# Patient Record
Sex: Female | Born: 1950 | Race: White | Hispanic: No | Smoking: Never smoker
Health system: Southern US, Community
[De-identification: ages and names within clinical notes are randomized; demographics above are authoritative.]

## PROBLEM LIST (undated history)

## (undated) DIAGNOSIS — G4730 Sleep apnea, unspecified: Secondary | ICD-10-CM

## (undated) DIAGNOSIS — K219 Gastro-esophageal reflux disease without esophagitis: Secondary | ICD-10-CM

## (undated) DIAGNOSIS — I739 Peripheral vascular disease, unspecified: Secondary | ICD-10-CM

## (undated) DIAGNOSIS — I10 Essential (primary) hypertension: Secondary | ICD-10-CM

## (undated) DIAGNOSIS — E7800 Pure hypercholesterolemia, unspecified: Secondary | ICD-10-CM

## (undated) DIAGNOSIS — I2510 Atherosclerotic heart disease of native coronary artery without angina pectoris: Secondary | ICD-10-CM

## (undated) DIAGNOSIS — F32A Depression, unspecified: Secondary | ICD-10-CM

## (undated) DIAGNOSIS — E039 Hypothyroidism, unspecified: Secondary | ICD-10-CM

## (undated) DIAGNOSIS — C229 Malignant neoplasm of liver, not specified as primary or secondary: Secondary | ICD-10-CM

## (undated) DIAGNOSIS — F329 Major depressive disorder, single episode, unspecified: Secondary | ICD-10-CM

## (undated) DIAGNOSIS — E119 Type 2 diabetes mellitus without complications: Secondary | ICD-10-CM

## (undated) DIAGNOSIS — M1990 Unspecified osteoarthritis, unspecified site: Secondary | ICD-10-CM

## (undated) HISTORY — PX: COLONOSCOPY: SHX174

## (undated) HISTORY — DX: Type 2 diabetes mellitus without complications: E11.9

## (undated) HISTORY — PX: DILATION AND CURETTAGE OF UTERUS: SHX78

## (undated) HISTORY — PX: CORONARY ANGIOPLASTY WITH STENT PLACEMENT: SHX49

## (undated) HISTORY — PX: CARPAL TUNNEL RELEASE: SHX101

## (undated) HISTORY — DX: Malignant neoplasm of liver, not specified as primary or secondary: C22.9

## (undated) HISTORY — PX: TONSILLECTOMY: SUR1361

## (undated) HISTORY — DX: Atherosclerotic heart disease of native coronary artery without angina pectoris: I25.10

---

## 1999-12-23 ENCOUNTER — Other Ambulatory Visit: Admission: RE | Admit: 1999-12-23 | Discharge: 1999-12-23 | Payer: Self-pay | Admitting: Obstetrics and Gynecology

## 2000-03-07 ENCOUNTER — Emergency Department (HOSPITAL_COMMUNITY): Admission: EM | Admit: 2000-03-07 | Discharge: 2000-03-07 | Payer: Self-pay | Admitting: *Deleted

## 2000-12-01 ENCOUNTER — Encounter: Payer: Self-pay | Admitting: Emergency Medicine

## 2000-12-01 ENCOUNTER — Emergency Department (HOSPITAL_COMMUNITY): Admission: EM | Admit: 2000-12-01 | Discharge: 2000-12-01 | Payer: Self-pay | Admitting: Emergency Medicine

## 2001-02-27 ENCOUNTER — Other Ambulatory Visit: Admission: RE | Admit: 2001-02-27 | Discharge: 2001-02-27 | Payer: Self-pay | Admitting: Obstetrics and Gynecology

## 2002-03-01 ENCOUNTER — Other Ambulatory Visit: Admission: RE | Admit: 2002-03-01 | Discharge: 2002-03-01 | Payer: Self-pay | Admitting: Obstetrics and Gynecology

## 2002-12-25 ENCOUNTER — Encounter: Payer: Self-pay | Admitting: Gastroenterology

## 2002-12-28 ENCOUNTER — Encounter: Payer: Self-pay | Admitting: Gastroenterology

## 2003-08-09 ENCOUNTER — Encounter: Payer: Self-pay | Admitting: Emergency Medicine

## 2003-08-09 ENCOUNTER — Inpatient Hospital Stay (HOSPITAL_COMMUNITY): Admission: EM | Admit: 2003-08-09 | Discharge: 2003-08-11 | Payer: Self-pay | Admitting: Emergency Medicine

## 2003-08-11 ENCOUNTER — Encounter: Payer: Self-pay | Admitting: Cardiology

## 2003-08-16 ENCOUNTER — Ambulatory Visit (HOSPITAL_COMMUNITY): Admission: RE | Admit: 2003-08-16 | Discharge: 2003-08-16 | Payer: Self-pay | Admitting: Cardiology

## 2004-12-15 ENCOUNTER — Emergency Department (HOSPITAL_COMMUNITY): Admission: EM | Admit: 2004-12-15 | Discharge: 2004-12-16 | Payer: Self-pay | Admitting: *Deleted

## 2005-12-27 ENCOUNTER — Observation Stay (HOSPITAL_COMMUNITY): Admission: EM | Admit: 2005-12-27 | Discharge: 2005-12-28 | Payer: Self-pay | Admitting: Emergency Medicine

## 2005-12-27 ENCOUNTER — Ambulatory Visit: Payer: Self-pay | Admitting: Cardiovascular Disease

## 2005-12-28 ENCOUNTER — Ambulatory Visit: Payer: Self-pay

## 2005-12-29 ENCOUNTER — Ambulatory Visit: Payer: Self-pay | Admitting: Cardiology

## 2006-01-10 ENCOUNTER — Encounter: Payer: Self-pay | Admitting: Cardiology

## 2006-01-10 ENCOUNTER — Ambulatory Visit: Payer: Self-pay

## 2006-08-31 ENCOUNTER — Encounter (INDEPENDENT_AMBULATORY_CARE_PROVIDER_SITE_OTHER): Payer: Self-pay | Admitting: *Deleted

## 2006-08-31 ENCOUNTER — Ambulatory Visit (HOSPITAL_COMMUNITY): Admission: RE | Admit: 2006-08-31 | Discharge: 2006-08-31 | Payer: Self-pay | Admitting: Obstetrics and Gynecology

## 2008-11-12 ENCOUNTER — Ambulatory Visit: Payer: Self-pay | Admitting: Gastroenterology

## 2008-11-12 DIAGNOSIS — R1013 Epigastric pain: Secondary | ICD-10-CM | POA: Insufficient documentation

## 2008-11-12 DIAGNOSIS — K219 Gastro-esophageal reflux disease without esophagitis: Secondary | ICD-10-CM | POA: Insufficient documentation

## 2008-11-12 LAB — CONVERTED CEMR LAB
ALT: 26 units/L (ref 0–35)
AST: 22 units/L (ref 0–37)
Albumin: 3.9 g/dL (ref 3.5–5.2)
Alkaline Phosphatase: 55 units/L (ref 39–117)
BUN: 11 mg/dL (ref 6–23)
Basophils Absolute: 0 10*3/uL (ref 0.0–0.1)
Basophils Relative: 0.3 % (ref 0.0–3.0)
CO2: 29 meq/L (ref 19–32)
Calcium: 9.4 mg/dL (ref 8.4–10.5)
Chloride: 102 meq/L (ref 96–112)
Creatinine, Ser: 0.7 mg/dL (ref 0.4–1.2)
Eosinophils Absolute: 0.1 10*3/uL (ref 0.0–0.7)
Eosinophils Relative: 0.9 % (ref 0.0–5.0)
GFR calc Af Amer: 112 mL/min
GFR calc non Af Amer: 92 mL/min
Glucose, Bld: 145 mg/dL — ABNORMAL HIGH (ref 70–99)
HCT: 39.4 % (ref 36.0–46.0)
Hemoglobin: 13.4 g/dL (ref 12.0–15.0)
Lipase: 26 units/L (ref 11.0–59.0)
Lymphocytes Relative: 32.3 % (ref 12.0–46.0)
MCHC: 34.1 g/dL (ref 30.0–36.0)
MCV: 94.7 fL (ref 78.0–100.0)
Monocytes Absolute: 0.4 10*3/uL (ref 0.1–1.0)
Monocytes Relative: 3.6 % (ref 3.0–12.0)
Neutro Abs: 6.1 10*3/uL (ref 1.4–7.7)
Neutrophils Relative %: 62.9 % (ref 43.0–77.0)
Platelets: 261 10*3/uL (ref 150–400)
Potassium: 3.5 meq/L (ref 3.5–5.1)
RBC: 4.16 M/uL (ref 3.87–5.11)
RDW: 12.7 % (ref 11.5–14.6)
Sodium: 141 meq/L (ref 135–145)
Total Bilirubin: 0.5 mg/dL (ref 0.3–1.2)
Total Protein: 7.1 g/dL (ref 6.0–8.3)
WBC: 9.8 10*3/uL (ref 4.5–10.5)

## 2008-11-15 ENCOUNTER — Ambulatory Visit (HOSPITAL_COMMUNITY): Admission: RE | Admit: 2008-11-15 | Discharge: 2008-11-15 | Payer: Self-pay | Admitting: Gastroenterology

## 2008-11-21 ENCOUNTER — Ambulatory Visit: Payer: Self-pay | Admitting: Gastroenterology

## 2008-11-21 DIAGNOSIS — K922 Gastrointestinal hemorrhage, unspecified: Secondary | ICD-10-CM | POA: Insufficient documentation

## 2008-11-21 DIAGNOSIS — R1011 Right upper quadrant pain: Secondary | ICD-10-CM | POA: Insufficient documentation

## 2008-11-21 DIAGNOSIS — R143 Flatulence: Secondary | ICD-10-CM

## 2008-11-21 DIAGNOSIS — K921 Melena: Secondary | ICD-10-CM | POA: Insufficient documentation

## 2008-11-21 DIAGNOSIS — K7689 Other specified diseases of liver: Secondary | ICD-10-CM | POA: Insufficient documentation

## 2008-11-21 DIAGNOSIS — R141 Gas pain: Secondary | ICD-10-CM | POA: Insufficient documentation

## 2008-11-21 DIAGNOSIS — R142 Eructation: Secondary | ICD-10-CM

## 2008-12-19 ENCOUNTER — Ambulatory Visit: Payer: Self-pay | Admitting: Gastroenterology

## 2011-03-05 NOTE — Discharge Summary (Signed)
[name], [name]                           ACCOUNT NO.:  [id number]   MEDICAL RECORD NO.:  [id number]                   PATIENT TYPE:  INP   LOCATION:  3711                                 FACILITY:  [facility]   PHYSICIAN:  Olga Millers, M.D.                DATE OF BIRTH:  October 12, 1951   DATE OF ADMISSION:  08/09/2003  DATE OF DISCHARGE:  08/11/2003                           DISCHARGE SUMMARY - REFERRING   BRIEF HISTORY:  This is a 60 year old female who was admitted to [facility] on August 09, 2003 through the emergency room for evaluation of  chest pain.  The patient had a history of hypertension, hyperlipidemia,  hypothyroidism as well as some previous surgeries.  The patient reports  dizziness and headache for approximately 1 week. She saw her primary care  M.D., Dr. Everlean Patterson 3 days prior to admission and was placed on Toprol  XL 25 mg daily secondary to hypertension.   On the morning of admission, the patient was at work when she developed  increased headache and dizziness, and a sensation of heaviness in her chest,  accompanied by nausea and dyspnea.  The patient presented to [facility]  Emergency Room where she was noted to have an abnormal EKG consisting of  T  wave inversions in 1, 2, 3 and V3-V6.  The patient also had a fluttering  sensation in her chest. She was seen by Dr. Antoine Poche and admitted for  further evaluation.   PAST MEDICAL HISTORY:  Hypertension, hyperlipidemia, hypothyroidism. She has  been under allot of increased stress recently.  History of previous  surgeries.   ALLERGIES:  NO KNOWN DRUG ALLERGIES.   SOCIAL HISTORY:  The patient lives in [place] with her boyfriend. She has  a son. She does not use alcohol or tobacco.   FAMILY HISTORY:  Patient's mother is living, she has congestive heart  failure, Alzheimer's dementia and diabetes mellitus.  Father died at age 21.  He had undergone coronary artery bypass graft surgery.   MEDICATIONS PRIOR TO ADMISSION:  1. Toprol  XL 25 mg daily.  2. Synthroid 0.1 mg daily.  3. Altace 10 mg daily.  4. Lipitor 10 mg at bedtime.  5. Estrostep 1 daily.   HOSPITAL COURSE:  As noted, the patient was admitted to [facility]  for further evaluation of chest pain as well as recent dizziness and  dyspnea.  She was scheduled for an adenosine Cardiolite. This was performed  on August 11, 2003.  The patient did have some EKG changes during this  test. As noted above, her baseline EKG was abnormal at time of admission.  She had further ST changes during the adenosine Cardiolite, and she also  developed sinus tachycardia with rates up to 120 during this study.  She had  chest pain and flushing, which resolved in the recovery period, as did the  EKG changes.  The images were reviewed by the radiologist. The study showed no ischemia,  ejection fraction 72%.  These findings were discussed with Dr. Jens Som and  decision was made to proceed with discharge, however, it was recommended  that she follow up with Dr. Antoine Poche in approximately 2 weeks to determine  if she was still having symptoms, in which case we would have a low  threshold to proceeding with cardiac catheterization.  The patient was  discharged in improved and stable condition on August 11, 2003.   LABORATORY DATA:  The patient's cardiac enzymes were negative x3.  A BUN on  day of discharge was 13, creatinine 0.8, potassium 4.0, glucose 101,  triglycerides elevated at 181.  Chest x-ray showed bilateral linear  atelectasis or scar formation, but was otherwise unremarkable.  CBC on  admission revealed hemoglobin 13, hematocrit 38.3, WBC 10.2 thousand,  platelets 286,000.   DISCHARGE MEDICATIONS:  1. Toprol  XL 25 mg daily.  2. Synthroid 0.1 mg daily.  3. Altace 10 mg daily.  4. Lipitor 10 mg at bedtime.  5. Estrostep 1 daily.  6. Enteric-coated aspirin 81 mg daily.  7. Tylenol as needed for pain.    ACTIVITY:  As tolerated.   DIET:  She was to stay on a low salt, low fat diet, to avoid sweets.   FOLLOW UP:  With Dr. Antoine Poche in approximately 2 weeks, Dr. Margrett Rud  in the next several weeks for further evaluation of hypertension and her  elevated glucose levels during this admission.   PROBLEM LIST AT TIME OF DISCHARGE:  1. Chest pain, MI ruled out.  2. Adenosine Cardiolite performed August 11, 2003. Negative for ischemia,     ejection fraction greater than 70%.  3. History of dyslipidemia.  4. History of hypothyroidism.  5. History of hypertension.      Delton See, P.A. [facility]                  Olga Millers, M.D.    DR/MEDQ  D:  08/11/2003  T:  08/11/2003  Job:  [id number]   cc:   Vale Haven. Andrey Campanile, M.D.  [address]  [place]  [place] [address]  Fax: [phone]

## 2011-03-05 NOTE — Discharge Summary (Signed)
[name], [name]                 ACCOUNT NO.:  [id number]   MEDICAL RECORD NO.:  [id number]          PATIENT TYPE:  INP   LOCATION:  2010                         FACILITY:  [facility]   PHYSICIAN:  Rollene Rotunda, M.D.   DATE OF BIRTH:  07-03-1951   DATE OF ADMISSION:  12/27/2005  DATE OF DISCHARGE:  12/28/2005                                 DISCHARGE SUMMARY   PRIMARY CARDIOLOGIST:  Rollene Rotunda, M.D.   DISCHARGING DIAGNOSIS:  Atypical chest pain, with abnormal  electrocardiogram, with negative cardiac markers for myocardial infarction.   PAST MEDICAL HISTORY:  1.  Noncardiac chest pain, with abnormal resting electrocardiogram.      1.  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      2.  Negative stress perfusion study prior to cardiac catheterization.  2.  Hypertension.  3.  Hyperlipidemia.  4.  History of palpitations, with a negative Holter monitor.  5.  Postmenopausal.  6.  Hypothyroidism.   HOSPITAL COURSE:  [name] is a 60 year old female with history as stated  above, who initially was seen in 2004 secondary to complaints of persistent  chest pain, with an abnormal EKG showing T wave inversions.  Cardiac  catheterization showed nonobstructive disease.  The patient states she has  done fine since that time until the day of admission when the patient began  having some chest discomfort and presented to her primary care physician  Dr. Daleen Snook office for further evaluation.  The patient was  transferred to [facility] Emergency Room via EMS at that time.  On arrival,  EKG was interpreted as an acute MI with ST elevation.  However, upon further  study the ST abnormalities consisted of ST depression in the inferolateral  leads.  Review of her EKG from 2004 shows a symmetric T wave inversion in  the inferolateral leads.  The patient was admitted for observation.  Cardiac  markers were cycled.  The patient ruled out for  MI.  Continued the patient's  medications from home.  Dr. Antoine Poche in to see patient on the following  morning.  Patient without any further episodes of chest discomfort.  It was  arranged for the patient to be discharged from the hospital setting to our  office for the adenosine stress Myoview.  At time of discharge, the patient  was instructed to continue her Synthroid 0.1 mg daily, metoprolol 25 mg  daily, Norvasc 10 mg daily, aspirin 81 mg daily, Prometrium 100 mg daily,  Zetia 10 mg daily, and Gynodiol 2 tablets p.o. daily.  The patient had a  follow-up appointment with Dr. Jenene Slicker assistant on December 29, 2005 at  3:00 p.m. to discuss the results of her stress test.   BLOOD WORK THIS ADMISSION:  CBC within normal limits, with a hematocrit of  44.  Kidney function:  BUN 8, with creatinine 0.7.  Liver panel all within  normal limits.  Troponin of 0.02.   Patient discharged to our office for adenosine stress test, and to follow up  as stated above.   DURATION OF DISCHARGE ENCOUNTER:  30 minutes.      Dorian Pod, NP    ______________________________  Rollene Rotunda, M.D.    MB/MEDQ  D:  02/10/2006  T:  02/11/2006  Job:  [id number]   cc:   Vale Haven. Andrey Campanile, M.D.  Fax: [phone]

## 2011-03-05 NOTE — H&P (Signed)
[name], [name]                 ACCOUNT NO.:  [id number]   MEDICAL RECORD NO.:  [id number]          PATIENT TYPE:  INP   LOCATION:  2010                         FACILITY:  [facility]   PHYSICIAN:  Charlton Haws, M.D.     DATE OF BIRTH:  1951/09/30   DATE OF ADMISSION:  12/27/2005  DATE OF DISCHARGE:                                HISTORY & PHYSICAL   PRIMARY CARDIOLOGIST:  Dr. Rollene Rotunda.   REASON FOR ADMISSION:  [name] is a 60 year old female, with history of  nonobstructive coronary artery disease by cardiac catheterization in October  2004, following complaint of persistent chest pain in the setting of a  negative perfusion study and abnormal electrocardiogram with T-wave  inversions.   Cardiac catheterization, however, notable for a long, 30% mid-LAD and a 25%  ostial lesion with otherwise normal residual arteries and normal left  ventricular function.   The patient was seen in follow-up by Dr. Antoine Poche following the  catheterization. He felt that the chest pain was noncardiac in etiology and  cleared and released the patient to return to Dr. Andrey Campanile for further  evaluation.   The patient now presents to the emergency room via EMS from Dr. Tawana Scale  office, for evaluation of chest symptoms. Per history, she states that she  has not had any chest pain since seen by [place] in the office in 2004. Moreover,  she reports no recent development of any exertional chest discomfort or  dyspnea.   Earlier this morning, while getting ready for work, she developed a vague  anterior chest sensation originating from the mid-epigastrium. At no time,  however, did she actually feel any frank chest pain. She felt a little  lightheaded but had no syncope. There was some mild nausea but no vomiting.  There was no acute dyspnea or diaphoresis. Nevertheless, she became  concerned regarding these symptoms and presented to her primary care  physician's office, and then promptly transported via EMS  to the emergency  room.   On arrival, electrocardiogram was interpreted as an acute MI with ST  elevation. However, upon further study, the ST abnormalities consist of ST  depression in the inferolateral leads.   Review of an EKG from 2004 shows a symmetric T-wave inversion in the  inferolateral leads.   On presentation, the patient is currently without any chest discomfort. She  is on IV nitroglycerin and heparin and has received one dose of 5 milligrams  IV Lopressor.   ALLERGIES:  CIPROFLOXACIN, ALEVE.   HOME MEDICATIONS:  1.  Synthroid 0.1 milligrams daily.  2.  Toprol XL 25 milligrams daily.  3.  Norvasc 10 milligrams daily.  4.  Prometrium 100 milligrams daily.  5.  The patient is on two hormone pills, one of which is Prometrium 100      milligrams daily.  6.  Aspirin 81 milligrams daily.  7.  Zetia 10 milligrams daily.   PAST HISTORY:  1.  History of noncardiac chest pain/abnormal resting electrocardiogram.      1.  Nonobstructive coronary artery disease by cardiac catheterization  October 2004.      2.  Negative stress perfusion study (pre cath).  2.  Hypertension.  3.  Hyperlipidemia.  4.  History of palpitations.      1.  Negative Holter monitor.  5.  Postmenopausal.  6.  Hypothyroidism.   SOCIAL HISTORY:  The patient lives here in [place] with a boyfriend. She  works at a [employer].  She has never smoked tobacco on a regular basis and drinks alcohol only on  occasion.   FAMILY HISTORY:  Notable for father deceased at age 28, complications from a  myocardial infarction and pneumonia while hospitalized; prior history of  multiple MIs/CABG. The patient has four sisters, none of whom have heart  disease.   REVIEW OF SYSTEMS:  Denies orthopnea. Denies paroxysmal nocturnal dyspnea,  lower extremity edema, but chronically sleep on two pillows. No recent  evidence of upper or lower GI bleeding. No recent fever, chills or  productive cough. Has noted  a tummy ache sensation two days ago but denies  any nausea until early this morning. No recent vomiting or diarrhea.  Otherwise, as noted per HPI. Remaining systems negative.   PHYSICAL EXAMINATION:  VITAL SIGNS: Blood pressure 178/87, pulse 98.7, pulse  112 and regular, respirations 20, sats 98% on room air.  GENERAL: A 60 year old female in no apparent distress.  HEENT: Normocephalic, atraumatic.  NECK: Preserved bilateral carotid pulse without bruits; no JVD.  LUNGS: Clear to auscultation in all fields.  HEART: Regular rate and rhythm (S1 and S2). No murmurs, rubs or gallops.  ABDOMEN: Protuberant, nontender with no organomegaly. Intact bowel sounds  without bruits.  EXTREMITIES: Palpable distal pulses without significant edema.  NEUROLOGICAL: No focal deficit.   Admission chest x-ray: Pending. Laboratory data: Pending.   IMPRESSION:  [name] is a 60 year old female, with history of  nonobstructive coronary artery disease by cardiac catheterization in October  2004 following a negative pharmacologic perfusion study, and with abnormal  electrocardiogram, who now presents to the emergency room with vague  symptomatology with respect to the upper epigastrium and chest region, but  with no frank chest pain.   Admission electrocardiogram is abnormal revealing ST-segment depression of  approximately 1 mm in the inferolateral leads. However, the patient does  have history of documented abnormal EKG with inferolateral T-wave inversion  by previous EKGs, prior to her catheterization.   Of note, the patient also presents with no recent development of any  exertional chest discomfort and is currently asymptomatic.   PLAN:  The patient's presenting symptoms are atypical for ischemia. However,  given her abnormal electrocardiogram, she will be admitted to the hospital for close monitoring and rule out of myocardial infarction. If serial  cardiac markers are all within normal limits, then  recommendation is to  proceed with a follow-up exercise stress Myoview in our office. Arrangements  have been made for this to be done tomorrow, December 28, 2005 at 12 noon. We  will have the patient hold Toprol on the morning of the procedure.   If, however, serial cardiac markers are abnormal, then the plan will be to  proceed with diagnostic coronary angiography.   The patient will be continued on her home medication regimen with the  addition of IV nitroglycerin and heparin which were initiated here in the  emergency room. In addition cycling cardiac markers, we will also check a  fasting lipid profile in the morning as well as a TSH level.      Gene Serpe,  P.A. [facility]    ______________________________  Charlton Haws, M.D.    GS/MEDQ  D:  12/27/2005  T:  12/28/2005  Job:  [id number]   cc:   Duffy Rhody C. Andrey Campanile, M.D.  Fax: [phone]

## 2011-03-05 NOTE — Op Note (Signed)
NAMECHELAN, HERINGER                 ACCOUNT NO.:  [id number]   MEDICAL RECORD NO.:  [id number]          PATIENT TYPE:  AMB   LOCATION:  SDC                           FACILITY:  [facility]   PHYSICIAN:  Carrington Clamp, M.D. DATE OF BIRTH:  1951-04-25   DATE OF PROCEDURE:  08/31/2006  DATE OF DISCHARGE:                               OPERATIVE REPORT   PREOPERATIVE DIAGNOSIS:  Postmenopausal bleeding.   POSTOPERATIVE DIAGNOSIS:  Postmenopausal bleeding.   PROCEDURES:  Dilation and curettage with hysteroscopy.   SURGEON:  Carrington Clamp, M.D.   ASSISTANT:  None.   ANESTHESIA:  LMA.   SPECIMENS:  Uterine curettings.   ESTIMATED BLOOD LOSS:  Minimal.   IV FLUIDS:  700 mL.   URINE OUTPUT:  Not measured.   COMPLICATIONS:  None.   FINDINGS:  Scant endometrium with possible two small submucosal fibroids  that were left intact.  No other abnormalities seen at except for  possible small endometrial polyps.   COUNTS:  Correct x3.   MEDICATIONS:  None.   TECHNIQUE:  After adequate general anesthesia was achieved, the patient  was prepped, draped usual sterile fashion in dorsal lithotomy position.  The bladder was emptied with a red rubber catheter and the cervix  dilated up with Shawnie Pons dilators after a speculum had been placed in the  vagina.  The hysteroscope was passed into the uterine cavity and the  above findings noted.  A sharp curettage was performed followed by a  look with the hysteroscope after scant tissue was removed.  There no  abnormalities seen.  After one more curettage all instruments were then  withdrawn from the vagina and the patient tolerated the procedure well,  was returned to recovery room in stable condition.   HYSTEROSCOPIC DEFICIT:  65 mL.      Carrington Clamp, M.D.  Electronically Signed     MH/MEDQ  D:  08/31/2006  T:  08/31/2006  Job:  [id number]

## 2011-03-05 NOTE — Cardiovascular Report (Signed)
   NAMEKELLER, MIKELS                           ACCOUNT NO.:  [id number]   MEDICAL RECORD NO.:  [id number]                   PATIENT TYPE:  OIB   LOCATION:  2874                                 FACILITY:  [facility]   PHYSICIAN:  Rollene Rotunda, M.D.                DATE OF BIRTH:  [birth year]   DATE OF PROCEDURE:  DATE OF DISCHARGE:  08/16/2003                              CARDIAC CATHETERIZATION   PRIMARY CARE PHYSICIAN:  Duffy Rhody C. Andrey Campanile, M.D.   PROCEDURE:  Left heart catheterization/coronary arteriography.   INDICATION:  Patient with chest pain and an abnormal EKG.   PROCEDURE:  Left-heart catheterization is performed via the right femoral  artery.  The artery was cannulated using anterior wall puncture.  A number 6  French arterial sheath was inserted via the modified Seldinger technique.  Preformed Judkins' and a pigtail catheter were utilized.  The patient  tolerated the procedure well and left the lab in stable condition.   RESULTS:   HEMODYNAMICS:  1. LEFT VENTRICLE:  LV 136/70, 134/75.  2. CORONARIES:  The left main was normal.  The LAD had a mid long 30%     lesion.  The circumflex in the AV grove is normal.  There was a small     ramus intermedius which was normal.  The OM1 was large and normal.  OM2     was small and normal.  The right coronary artery was a large dominant     vessel.  The PDA had ostial 25% stenosis.  The rest was otherwise     unremarkable.   LEFT VENTRICULOGRAM:  A left ventriculogram was obtained in the RAO  projection.  The EF was 65% and normal.   CONCLUSION:  Minimal coronary plaque.   PLAN:  No further coronary workup was indicated.  The patient will have  management of her hypertension.  She will get a Holter monitor for  evaluation of fluttering.                                               Rollene Rotunda, M.D.    JH/MEDQ  D:  08/16/2003  T:  08/16/2003  Job:  [id number]

## 2011-03-05 NOTE — H&P (Signed)
NAMERIKI, BERNINGER                           ACCOUNT NO.:  [id number]   MEDICAL RECORD NO.:  [id number]                   PATIENT TYPE:  EMS   LOCATION:  MINO                                 FACILITY:  [facility]   PHYSICIAN:  Rollene Rotunda, M.D.                DATE OF BIRTH:  17-Jun-1951   DATE OF ADMISSION:  08/09/2003  DATE OF DISCHARGE:                                HISTORY & PHYSICAL   PRIMARY CARE PHYSICIAN:  Duffy Rhody C. Andrey Campanile, M.D.   REASON FOR ADMISSION:  The patient with chest pain and dizziness.   HISTORY OF PRESENT ILLNESS:  The patient is a pleasant 60 year old white  female with no prior cardiac history.  She does have a history of  hypertension, and it sounds like it has been somewhat difficult to control.  She also has hyperlipidemia.  She has been treated recently with the  addition of Toprol to her medical regimen.  She says that she has been  feeling somewhat weak and dizzy and had related it to that.  She felt some  fluttering in her chest.  She had a headache.  She did not have any  presyncope or syncope.  She has some baseline shortness of breath that  precludes her from being particularly active.  This has been chronic.  She  also gets leg weakness.  These have been stable symptoms.  Today, she did  have some chest discomfort.  She described the substernal sensation that was  occurring with the other symptoms mentioned above.  She was mildly  nauseated.  She did not have any neck or arm discomfort.  She felt somewhat  flushed.  She was advised to come to the emergency room.  She had an EKG  which demonstrated T-wave inversions in 2, 3 aVF, V3 through V6, and lead 1.  In comparison to EKG from 2002, there was baseline artifact, but less  pronounced T-wave inversion in lead V4 and lead V5.  Enzymes so far have  been negative.  The patient is having no chest discomfort.   PAST MEDICAL HISTORY:  1. Hypertension x2 years.  2. Hyperlipidemia x2 years.  3.  Hypothyroidism.   PAST SURGICAL HISTORY:  Tonsillectomy.   ALLERGIES:  None.   MEDICATIONS:  1. Toprol XL 25 mg daily.  2. Synthroid 0.1 mg daily.  3. Altace 10 mg daily.  4. Lipitor 10 mg q.h.s.  5. Estratest.   SOCIAL HISTORY:  The patient lives in [place] with her boyfriend.  She is  an Environmental health practitioner.  She has one 55 year old son who is about to  get married.  She has never smoked cigarettes and does not drink alcohol.   FAMILY HISTORY:  Contributory for father dying of coronary disease after  bypass at age 11.   REVIEW OF SYSTEMS:  Positive for 10-15 pound weight gain in six months  secondary to inability to exercise  with back pain, hot flashes and  questionable menopause symptoms.  Otherwise, as stated in HPI.  Otherwise  negative for all other systems.   PHYSICAL EXAMINATION:  GENERAL:  The patient is in no distress.  VITAL SIGNS:  Blood pressure 154/78, heart rate 90 and regular, afebrile.  HEENT:  Eyelids unremarkable.  Pupils equal, round and reactive to light.  Fundi not visualized.  Oral mucosa unremarkable.  NECK:  No jugular venous distention.  Waveform within normal limits.  Carotid upstroke brisk and symmetrical.  No bruits, thyromegaly.  LYMPHATICS:  No cervical, axillary or inguinal adenopathy.  LUNGS:  Clear to auscultation bilaterally.  BACK:  No costovertebral angle tenderness.  CHEST:  Unremarkable.  HEART:  PMI not displaced, sustained.  S1 and S2 within normal limits.  No  S3.  Positive S4.  No murmurs.  ABDOMEN:  Obese, positive bowel sounds, normal in frequency and pitch, no  bruits, rebound, guarding, no midline pulsatile mass, hepatosplenomegaly or  splenomegaly.  SKIN:  No rash, no nodules.  EXTREMITIES:  There are 2+ pulses throughout, no edema, no cyanosis or  clubbing.  NEUROLOGIC:  Oriented to person, place and time.  Cranial nerves II-XII are  grossly intact.  Motor grossly intact.   EKG:  Sinus rhythm, axis within normal  limits, intervals within normal  limits, inferior T wave inversions 2, 3 and aVF, lateral T wave inversions  in V3, 4, 5 and 6 and lead 1.  Possible LVH with strain versus ischemia.  No  recent EKG's for comparison.   LABORATORY DATA:  Hemoglobin 13,  WBC 10.2, sodium 143, potassium 3.9, BUN  11, creatinine 0.7.  Troponin less than 0.05 x2.  CK-MB 1.0 x2.   ASSESSMENT/PLAN:  1. Chest discomfort/ dizziness.  The patient has symptoms that predominantly     are dizziness and  fluttering feeling in her chest.  She also today has     had some chest discomfort that has been mild.  She has had negative     cardiac enzymes.  I think that her symptoms are predominantly atypical.     She does have some EKG changes.  These are possibly LVH with strain.     Given that she has had difficult-to-control hypertension and has an S4 on     exam, at this point, I plan to rule her out for myocardial infarction.     If she rules in, she will have a cardiac catheterization.  However, if     her enzymes are negative, we will set her up for a stress perfusion     study.  She could not ambulate on a treadmill because of her chronic     complaints.  She would need an adenosine Cardiolite.  2. Hypertension.  We will continue her Altace, and beta-blocker for now.     She may need medication adjustment with increased beta-blocker prior to     discharge.  3. Risk reduction.  We will check a lipid profile, and continue the Lipitor.  4. Hypothyroidism.  We will check a TSH.  5. Obesity.  We will counsel her about diet and exercise for weight loss.                                                Rollene Rotunda, M.D.    JH/MEDQ  D:  08/09/2003  T:  08/09/2003  Job:  [id number]

## 2011-09-30 ENCOUNTER — Encounter: Payer: Self-pay | Admitting: *Deleted

## 2011-09-30 ENCOUNTER — Emergency Department (HOSPITAL_COMMUNITY)
Admission: EM | Admit: 2011-09-30 | Discharge: 2011-09-30 | Disposition: A | Discharge status: Home / Self Care | Payer: Self-pay | Attending: Emergency Medicine | Admitting: Emergency Medicine

## 2011-09-30 ENCOUNTER — Other Ambulatory Visit: Payer: Self-pay

## 2011-09-30 DIAGNOSIS — E7800 Pure hypercholesterolemia, unspecified: Secondary | ICD-10-CM | POA: Insufficient documentation

## 2011-09-30 DIAGNOSIS — Z79899 Other long term (current) drug therapy: Secondary | ICD-10-CM | POA: Insufficient documentation

## 2011-09-30 DIAGNOSIS — E039 Hypothyroidism, unspecified: Secondary | ICD-10-CM | POA: Insufficient documentation

## 2011-09-30 DIAGNOSIS — I10 Essential (primary) hypertension: Secondary | ICD-10-CM | POA: Insufficient documentation

## 2011-09-30 DIAGNOSIS — R002 Palpitations: Secondary | ICD-10-CM | POA: Insufficient documentation

## 2011-09-30 DIAGNOSIS — R42 Dizziness and giddiness: Secondary | ICD-10-CM | POA: Insufficient documentation

## 2011-09-30 HISTORY — DX: Sleep apnea, unspecified: G47.30

## 2011-09-30 HISTORY — DX: Pure hypercholesterolemia, unspecified: E78.00

## 2011-09-30 HISTORY — DX: Essential (primary) hypertension: I10

## 2011-09-30 HISTORY — DX: Hypothyroidism, unspecified: E03.9

## 2011-09-30 LAB — POCT I-STAT, CHEM 8
BUN: 13 mg/dL (ref 6–23)
Calcium, Ion: 1.13 mmol/L (ref 1.12–1.32)
Chloride: 110 mEq/L (ref 96–112)
Potassium: 4.2 mEq/L (ref 3.5–5.1)

## 2011-09-30 MED ORDER — MECLIZINE HCL 25 MG PO TABS
25.0000 mg | ORAL_TABLET | Freq: Once | ORAL | Status: AC
  Administered 2011-09-30: 25 mg via ORAL
  Filled 2011-09-30: qty 1

## 2011-09-30 MED ORDER — MECLIZINE HCL 25 MG PO TABS: 25.0000 mg | ORAL_TABLET | Freq: Three times a day (TID) | ORAL | Status: AC | PRN

## 2011-09-30 NOTE — ED Notes (Signed)
Patient is resting comfortably. 

## 2011-09-30 NOTE — ED Provider Notes (Signed)
History     CSN: [id number] Arrival date & time: 09/30/2011  8:18 AM   First MD Initiated Contact with Patient 09/30/11 [phone]      Chief Complaint  Patient presents with  . Dizziness     HPI Onset - this AM (2 hrs ago) Sudden onset Worse with positions Improved with - rest Associated symptoms - dizziness Denies severe headache/visual changes/hearing loss/cp/sob/focal weakness Denies fall/trauma Denies fever No difficulty speaking No vomiting/diarrhea She reports mild palpitations  She reports waking up with these symptoms Felt well last night No h/o CVA No vomiting   Past Medical History  Diagnosis Date  . Sleep apnea   . Hypertension   . Hypercholesteremia   . Hypothyroidism     Past Surgical History  Procedure Date  . Tonsillectomy   . Dilation and curettage of uterus   . Carpal tunnel release     right    No family history on file.  History  Substance Use Topics  . Smoking status: Never Smoker   . Smokeless tobacco: Not on file  . Alcohol Use: Yes     ocassionally    OB History    Grav Para Term Preterm Abortions TAB SAB Ect Mult Living                  Review of Systems  All other systems reviewed and are negative.    Allergies  Aleve and Ciprofloxacin  Home Medications   Current Outpatient Rx  Name Route Sig Dispense Refill  . AMLODIPINE BESYLATE 5 MG PO TABS Oral Take 5 mg by mouth daily.      . ASPIRIN EC 81 MG PO TBEC Oral Take 81 mg by mouth daily.      Marland Kitchen BENAZEPRIL HCL 40 MG PO TABS Oral Take 40 mg by mouth daily.      Marland Kitchen ESTRADIOL 1 MG PO TABS Oral Take 1 mg by mouth daily.      Marland Kitchen FLUOXETINE HCL 20 MG PO CAPS Oral Take 20 mg by mouth daily.      Marland Kitchen LEVOTHYROXINE SODIUM 200 MCG PO TABS Oral Take 100 mcg by mouth daily.      Marland Kitchen MEDROXYPROGESTERONE ACETATE 5 MG PO TABS Oral Take 5 mg by mouth daily.      Marland Kitchen METOPROLOL TARTRATE 100 MG PO TABS Oral Take 100 mg by mouth daily.      Marland Kitchen OMEPRAZOLE MAGNESIUM 20 MG PO TBEC Oral Take 20  mg by mouth daily.      Marland Kitchen PRAVASTATIN SODIUM 80 MG PO TABS Oral Take 80 mg by mouth daily.        BP 157/90  Pulse 62  Temp(Src) 98.3 F (36.8 C) (Oral)  Resp 20  Ht 5\' 8"  (1.727 m)  Wt 240 lb (108.863 kg)  BMI 36.49 kg/m2  SpO2 97%  Physical Exam  CONSTITUTIONAL: Well developed/well nourished HEAD AND FACE: Normocephalic/atraumatic EYES: EOMI/PERRL, no nystagmus ENMT: Mucous membranes moist, left Tm/right TM normal/intact NECK: supple no meningeal signs, no bruits CV: S1/S2 noted, no murmurs/rubs/gallops noted LUNGS: Lungs are clear to auscultation bilaterally, no apparent distress ABDOMEN: soft, nontender, no rebound or guarding GU:no cva tenderness NEURO:Awake/alert, facies symmetric, no arm or leg drift is noted Cranial nerves 3/4/5/6/04/25/09/11/12 tested and intact Gait normal No nystagmus She reports "surroundings spinning" while moving in chair EXTREMITIES: pulses normal, full ROM SKIN: warm, color normal PSYCH: no abnormalities of mood noted   ED Course  Procedures   Labs Reviewed  I-STAT, CHEM 8   11:29 AM Pt improved Vertigo improved Denies active cp/sob Suspicion for acute CVA/TIA is low Suspicion for ACS is low I do not feel EKG is an acute change  The patient appears reasonably screened and/or stabilized for discharge and I doubt any other medical condition or other [facility] requiring further screening, evaluation, or treatment in the ED at this time prior to discharge.   MDM  Nursing notes reviewed and considered in documentation Previous records reviewed and considered All labs/vitals reviewed and considered     Date: 09/30/2011  Rate: 62  Rhythm: normal sinus rhythm  QRS Axis: normal  Intervals: normal  ST/T Wave abnormalities: LVH with repolarization  Conduction Disutrbances:none  Narrative Interpretation:   Old EKG Reviewed: unchanged Pt with repol abnormality.  Most of these changes seen in EKG from 2004.  No STEMI.          Joya Gaskins, MD 09/30/11 1130

## 2011-09-30 NOTE — ED Notes (Signed)
Pt states "when I got up I just felt light headed, I had this a couple of yrs ago, went to the ER & was told I was dehydrated, I feel a flushing in my stomach & through my chest, denies n/v/diaphoresis"

## 2011-09-30 NOTE — ED Notes (Signed)
Family at bedside. 

## 2011-09-30 NOTE — ED Notes (Signed)
MD at bedside. 

## 2011-09-30 NOTE — ED Notes (Signed)
Vital signs stable. 

## 2011-09-30 NOTE — ED Notes (Signed)
Patient denies pain and is resting comfortably.  

## 2012-01-07 ENCOUNTER — Other Ambulatory Visit: Payer: Self-pay | Admitting: Obstetrics and Gynecology

## 2012-01-07 DIAGNOSIS — Z1231 Encounter for screening mammogram for malignant neoplasm of breast: Secondary | ICD-10-CM

## 2012-01-18 ENCOUNTER — Ambulatory Visit (HOSPITAL_COMMUNITY): Payer: Self-pay | Attending: Obstetrics and Gynecology

## 2012-01-18 ENCOUNTER — Ambulatory Visit (INDEPENDENT_AMBULATORY_CARE_PROVIDER_SITE_OTHER): Payer: Self-pay | Admitting: *Deleted

## 2012-01-18 ENCOUNTER — Encounter: Payer: Self-pay | Admitting: *Deleted

## 2012-01-18 VITALS — BP 140/84 | HR 63 | Temp 97.0°F | Ht 68.0 in | Wt 254.1 lb

## 2012-01-18 DIAGNOSIS — N898 Other specified noninflammatory disorders of vagina: Secondary | ICD-10-CM

## 2012-01-18 DIAGNOSIS — Z01419 Encounter for gynecological examination (general) (routine) without abnormal findings: Secondary | ICD-10-CM

## 2012-01-18 NOTE — Progress Notes (Signed)
No complaints today.  Pap Smear:    Completed Pap smear today. Per patient she thinks last Pap smear was 2011 and normal. Patient stated she has a history of an abnormal Pap smear when she was in her 20's that required a repeat Pap smear that was normal. No Pap smear results in EPIC.  Physical exam: Breasts Breasts symmetrical. No skin abnormalities bilateral breasts. No nipple retraction bilateral breasts. No nipple discharge bilateral breasts. No lymphadenopathy. No lumps palpated right breast. Palpated two small lumps in the left breast at 1 o'clock and 3 o'clock next to areola. Patient complained of tenderness on palpation of lump at 1 o'clock.          Pelvic/Bimanual   Ext Genitalia No lesions, no swelling and no discharge observed on external genitalia.         Vagina Vagina pink and normal texture. No lesions and white watery discharge observed in vagina. Wet prep completed.          Cervix Cervix is present. Cervix pink and of normal texture. No discharge observed at cervical os.      Uterus Uterus is present and palpable. Uterus in normal position and normal size.       Adnexae Bilateral ovaries present and palpable. No tenderness on palpation.        Rectovaginal No rectal exam completed today since patient had no rectal complaints. No skin abnormalities observed on rectal area.

## 2012-01-18 NOTE — Patient Instructions (Signed)
Taught patient how to perform BSE.  Let her know [facility] will cover Pap smears every 3 years unless has a history of abnormal Pap smears. Patient is scheduled for a diagnostic mammogram at [facility] on January 20, 2012 at 1400. Patient aware of appointment and will be there. Let patient know will follow up with her within the next couple weeks with results. Patient verbalized understanding.

## 2012-01-20 LAB — WET PREP, GENITAL
Clue Cells Wet Prep HPF POC: NONE SEEN
Trich, Wet Prep: NONE SEEN
WBC, Wet Prep HPF POC: NONE SEEN
Yeast Wet Prep HPF POC: NONE SEEN

## 2012-01-24 ENCOUNTER — Telehealth: Payer: Self-pay | Admitting: *Deleted

## 2012-01-24 NOTE — Telephone Encounter (Signed)
Patient was given results to diagnostic mammogram by the Radiologist at [facility] while in office on 01/20/12. Patient's Pap smear and wet prep were normal. A letter with patient's Pap smear and wet prep results will be sent to patient.

## 2012-02-07 ENCOUNTER — Encounter: Payer: Self-pay | Admitting: Obstetrics and Gynecology

## 2012-04-08 ENCOUNTER — Emergency Department (HOSPITAL_COMMUNITY): Payer: Self-pay

## 2012-04-08 ENCOUNTER — Emergency Department (HOSPITAL_BASED_OUTPATIENT_CLINIC_OR_DEPARTMENT_OTHER): Payer: Self-pay

## 2012-04-08 ENCOUNTER — Emergency Department (HOSPITAL_BASED_OUTPATIENT_CLINIC_OR_DEPARTMENT_OTHER)
Admission: EM | Admit: 2012-04-08 | Discharge: 2012-04-09 | Disposition: A | Discharge status: Home / Self Care | Payer: Self-pay | Attending: Emergency Medicine | Admitting: Emergency Medicine

## 2012-04-08 ENCOUNTER — Encounter (HOSPITAL_BASED_OUTPATIENT_CLINIC_OR_DEPARTMENT_OTHER): Payer: Self-pay

## 2012-04-08 DIAGNOSIS — Y92009 Unspecified place in unspecified non-institutional (private) residence as the place of occurrence of the external cause: Secondary | ICD-10-CM | POA: Insufficient documentation

## 2012-04-08 DIAGNOSIS — Y9301 Activity, walking, marching and hiking: Secondary | ICD-10-CM | POA: Insufficient documentation

## 2012-04-08 DIAGNOSIS — Z79899 Other long term (current) drug therapy: Secondary | ICD-10-CM | POA: Insufficient documentation

## 2012-04-08 DIAGNOSIS — X500XXA Overexertion from strenuous movement or load, initial encounter: Secondary | ICD-10-CM | POA: Insufficient documentation

## 2012-04-08 DIAGNOSIS — E039 Hypothyroidism, unspecified: Secondary | ICD-10-CM | POA: Insufficient documentation

## 2012-04-08 DIAGNOSIS — S82843A Displaced bimalleolar fracture of unspecified lower leg, initial encounter for closed fracture: Secondary | ICD-10-CM | POA: Insufficient documentation

## 2012-04-08 DIAGNOSIS — E7800 Pure hypercholesterolemia, unspecified: Secondary | ICD-10-CM | POA: Insufficient documentation

## 2012-04-08 DIAGNOSIS — I10 Essential (primary) hypertension: Secondary | ICD-10-CM | POA: Insufficient documentation

## 2012-04-08 DIAGNOSIS — G4730 Sleep apnea, unspecified: Secondary | ICD-10-CM | POA: Insufficient documentation

## 2012-04-08 MED ORDER — ONDANSETRON HCL 4 MG/2ML IJ SOLN
INTRAMUSCULAR | Status: AC
  Administered 2012-04-08: 18:00:00
  Filled 2012-04-08: qty 2

## 2012-04-08 MED ORDER — ONDANSETRON HCL 4 MG/2ML IJ SOLN
INTRAMUSCULAR | Status: AC
  Administered 2012-04-08: 4 mg
  Filled 2012-04-08: qty 2

## 2012-04-08 MED ORDER — PROPOFOL 10 MG/ML IV BOLUS
0.5000 mg/kg | Freq: Once | INTRAVENOUS | Status: AC
  Administered 2012-04-08: 40 mg via INTRAVENOUS
  Filled 2012-04-08: qty 20
  Filled 2012-04-08: qty 5.75

## 2012-04-08 MED ORDER — OXYCODONE-ACETAMINOPHEN 5-325 MG PO TABS: 1.0000 | ORAL_TABLET | ORAL | Status: AC | PRN

## 2012-04-08 MED ORDER — PROPOFOL 10 MG/ML IV EMUL
INTRAVENOUS | Status: AC | PRN
  Administered 2012-04-08: 2 mL via INTRAVENOUS

## 2012-04-08 MED ORDER — FENTANYL CITRATE 0.05 MG/ML IJ SOLN
INTRAMUSCULAR | Status: AC
  Administered 2012-04-08: 100 ug
  Filled 2012-04-08: qty 2

## 2012-04-08 MED ORDER — ONDANSETRON HCL 4 MG/2ML IJ SOLN
4.0000 mg | Freq: Once | INTRAMUSCULAR | Status: AC
  Administered 2012-04-08: 4 mg via INTRAVENOUS

## 2012-04-08 MED ORDER — ONDANSETRON HCL 4 MG/2ML IJ SOLN
INTRAMUSCULAR | Status: AC
  Administered 2012-04-08: 4 mg via INTRAVENOUS
  Filled 2012-04-08: qty 2

## 2012-04-08 MED ORDER — FENTANYL CITRATE 0.05 MG/ML IJ SOLN
INTRAMUSCULAR | Status: AC
  Filled 2012-04-08: qty 2

## 2012-04-08 MED ORDER — ETOMIDATE 2 MG/ML IV SOLN
20.0000 mg | Freq: Once | INTRAVENOUS | Status: AC
  Administered 2012-04-08: 20 mg via INTRAVENOUS
  Filled 2012-04-08: qty 10

## 2012-04-08 MED ORDER — FENTANYL CITRATE 0.05 MG/ML IJ SOLN
INTRAMUSCULAR | Status: AC | PRN
  Administered 2012-04-08: 100 ug via INTRAVENOUS

## 2012-04-08 MED ORDER — MORPHINE SULFATE 2 MG/ML IJ SOLN
INTRAMUSCULAR | Status: AC
  Administered 2012-04-08: 2 mg via INTRAVENOUS
  Filled 2012-04-08: qty 1

## 2012-04-08 MED ORDER — MORPHINE SULFATE 4 MG/ML IJ SOLN
6.0000 mg | Freq: Once | INTRAMUSCULAR | Status: AC
  Administered 2012-04-08: 6 mg via INTRAVENOUS
  Filled 2012-04-08: qty 1

## 2012-04-08 MED ORDER — METHOCARBAMOL 500 MG PO TABS: 500.0000 mg | ORAL_TABLET | Freq: Two times a day (BID) | ORAL | Status: AC

## 2012-04-08 NOTE — ED Provider Notes (Signed)
History     CSN: [id number]  Arrival date & time 04/08/12  1740   First MD Initiated Contact with Patient 04/08/12 1750      Chief Complaint  Patient presents with  . Ankle Pain    (Consider location/radiation/quality/duration/timing/severity/associated sxs/prior treatment) HPI Comments: Pt states that she was walking in the yard and turned her ankle pt states that she had obvious deformity of the left ankle  Patient is a 61 y.o. female presenting with ankle pain. The history is provided by the patient. No language interpreter was used.  Ankle Pain  The incident occurred 1 to 2 hours ago. The incident occurred at home. The injury mechanism was a fall. The pain is present in the left foot and left ankle. The quality of the pain is described as aching. The pain is moderate. She reports no foreign bodies present. The symptoms are aggravated by activity.    Past Medical History  Diagnosis Date  . Sleep apnea   . Hypertension   . Hypercholesteremia   . Hypothyroidism     Past Surgical History  Procedure Date  . Tonsillectomy   . Dilation and curettage of uterus   . Carpal tunnel release     right    No family history on file.  History  Substance Use Topics  . Smoking status: Never Smoker   . Smokeless tobacco: Never Used  . Alcohol Use: Yes     ocassionally    OB History    Grav Para Term Preterm Abortions TAB SAB Ect Mult Living   2 1 1  1  1   1       Review of Systems  Constitutional: Negative.   Respiratory: Negative.   Cardiovascular: Negative.     Allergies  Ciprofloxacin and Naproxen sodium  Home Medications   Current Outpatient Rx  Name Route Sig Dispense Refill  . AMLODIPINE BESYLATE 5 MG PO TABS Oral Take 5 mg by mouth daily.      . ASPIRIN EC 81 MG PO TBEC Oral Take 81 mg by mouth daily.      Marland Kitchen BENAZEPRIL HCL 40 MG PO TABS Oral Take 40 mg by mouth daily.      Marland Kitchen ESTRADIOL 1 MG PO TABS Oral Take 1 mg by mouth daily.      Marland Kitchen FLUOXETINE HCL 20 MG  PO CAPS Oral Take 20 mg by mouth daily.      Marland Kitchen LEVOTHYROXINE SODIUM 200 MCG PO TABS Oral Take 100 mcg by mouth daily.      Marland Kitchen MEDROXYPROGESTERONE ACETATE 5 MG PO TABS Oral Take 5 mg by mouth daily.      Marland Kitchen METOPROLOL TARTRATE 100 MG PO TABS Oral Take 100 mg by mouth daily.      Marland Kitchen OMEPRAZOLE MAGNESIUM 20 MG PO TBEC Oral Take 20 mg by mouth daily.      Marland Kitchen PRAVASTATIN SODIUM 80 MG PO TABS Oral Take 80 mg by mouth daily.        BP 148/65  Pulse 65  Resp 16  SpO2 94%  Physical Exam  Nursing note and vitals reviewed. Constitutional: She is oriented to person, place, and time. She appears well-developed and well-nourished.  HENT:  Head: Normocephalic and atraumatic.  Cardiovascular: Normal rate and regular rhythm.   Pulmonary/Chest: Effort normal and breath sounds normal.  Musculoskeletal:       Pt has obvious deformity to the left ankle:pulse intact:neurovascularly intact  Neurological: She is alert and oriented to person, place,  and time. She exhibits normal muscle tone. Coordination normal.  Skin: Skin is warm and dry.    ED Course  Procedures (including critical care time)  Labs Reviewed - No data to display Dg Ankle 2 Views Left  04/08/2012  *RADIOLOGY REPORT*  Clinical Data: Postreduction.  LEFT ANKLE - 2 VIEW  Comparison: 04/08/2012  Findings: In cast views of the left ankle again demonstrate the fractures in the distal tibia and fibula.  There now appears to be dislocation of the distal tibia relative to the talus on the lateral view.  IMPRESSION: There now appears to be dislocation at the left ankle. Fractures again noted.  Overlying cast material obscures fine bony detail.  Original Report Authenticated By: Cyndie Chime, M.D.   Dg Ankle Complete Left  04/08/2012  *RADIOLOGY REPORT*  Clinical Data: Left ankle pain and swelling following injury.  LEFT ANKLE COMPLETE - 3+ VIEW  Comparison: None  Findings: There is lateral subluxation/near dislocation at the tibiotalar joint.  Fractures of the medial malleolus and anterior distal tibial noted. A mildly comminuted fracture of the distal fibula is present. Soft tissue swelling is present. The talar dome appears intact.  IMPRESSION: Fracture subluxation/near dislocation of the left ankle as described.  Original Report Authenticated By: Rosendo Gros, M.D.   Dg Foot Complete Left  04/08/2012  *RADIOLOGY REPORT*  Clinical Data: Left foot injury and pain.  LEFT FOOT - COMPLETE 3+ VIEW  Comparison: None  Findings: Medial and lateral malleolar fractures and subluxation at the tibiotalar joint are identified but better evaluated on the ankle films. No other fracture, subluxation or dislocation identified within the left foot. The Lisfranc joints are intact.  IMPRESSION: Ankle fracture-subluxation/near dislocation, better evaluated on the ankle films.  No other bony abnormalities within the left foot.  Original Report Authenticated By: Rosendo Gros, M.D.     1. Bimalleolar ankle fracture       MDM  Pt accepted by Dr. Shon Baton to the er at [place]:pt splinted and intact here        Teressa Lower, NP 04/08/12 2026

## 2012-04-08 NOTE — ED Provider Notes (Signed)
Medical screening examination/treatment/procedure(s) were conducted as a shared visit with non-physician practitioner(s) and myself.  I personally evaluated the patient during the encounter   Rolan Bucco, MD 04/08/12 2109

## 2012-04-08 NOTE — ED Notes (Signed)
Attempted PIV x 1 unsuccessful left posterior FA

## 2012-04-08 NOTE — Consult Note (Addendum)
Amy Patch, MD Chief Complaint: Left ankle fracture  History: Patient s/p fall at home.  Presented to [facility] ER.  Xrays demonstrate fracture/subluxation.  Attempt at closed reduction was unsuccessful.  As a result patient transported to [facility] ER for repeat reduction.   Past Medical History  Diagnosis Date  . Sleep apnea   . Hypertension   . Hypercholesteremia   . Hypothyroidism     Allergies  Allergen Reactions  . Ciprofloxacin Itching  . Naproxen Sodium Other (See Comments)    Stomach burns     No current facility-administered medications on file prior to encounter.   Current Outpatient Prescriptions on File Prior to Encounter  Medication Sig Dispense Refill  . amLODipine (NORVASC) 5 MG tablet Take 5 mg by mouth daily.        Marland Kitchen aspirin EC 81 MG tablet Take 81 mg by mouth daily.        . benazepril (LOTENSIN) 40 MG tablet Take 40 mg by mouth daily.        Marland Kitchen estradiol (ESTRACE) 1 MG tablet Take 1 mg by mouth daily.        Marland Kitchen FLUoxetine (PROZAC) 20 MG capsule Take 20 mg by mouth daily.        Marland Kitchen levothyroxine (SYNTHROID, LEVOTHROID) 200 MCG tablet Take 100 mcg by mouth daily.        . medroxyPROGESTERone (PROVERA) 5 MG tablet Take 5 mg by mouth daily.        . metoprolol (LOPRESSOR) 100 MG tablet Take 100 mg by mouth daily.        Marland Kitchen omeprazole (PRILOSEC OTC) 20 MG tablet Take 20 mg by mouth daily.        . pravastatin (PRAVACHOL) 80 MG tablet Take 80 mg by mouth daily.          Physical Exam: Filed Vitals:   04/08/12 2017  BP: 129/61  Pulse: 79  Resp: 13   No sob/cp adb soft/NT Compartments soft/nt NVI - 2+ DP/PT pulses EHL/GA/TA grossly intact No laceration/abrasion noted  Image: Dg Ankle 2 Views Left  04/08/2012  *RADIOLOGY REPORT*  Clinical Data: Postreduction.  LEFT ANKLE - 2 VIEW  Comparison: 04/08/2012  Findings: In cast views of the left ankle again demonstrate the fractures in the distal tibia and fibula.  There now appears to be dislocation of  the distal tibia relative to the talus on the lateral view.  IMPRESSION: There now appears to be dislocation at the left ankle. Fractures again noted.  Overlying cast material obscures fine bony detail.  Original Report Authenticated By: Cyndie Chime, M.D.   Dg Ankle Complete Left  04/08/2012  *RADIOLOGY REPORT*  Clinical Data: Left ankle pain and swelling following injury.  LEFT ANKLE COMPLETE - 3+ VIEW  Comparison: None  Findings: There is lateral subluxation/near dislocation at the tibiotalar joint. Fractures of the medial malleolus and anterior distal tibial noted. A mildly comminuted fracture of the distal fibula is present. Soft tissue swelling is present. The talar dome appears intact.  IMPRESSION: Fracture subluxation/near dislocation of the left ankle as described.  Original Report Authenticated By: Rosendo Gros, M.D.   Dg Foot Complete Left  04/08/2012  *RADIOLOGY REPORT*  Clinical Data: Left foot injury and pain.  LEFT FOOT - COMPLETE 3+ VIEW  Comparison: None  Findings: Medial and lateral malleolar fractures and subluxation at the tibiotalar joint are identified but better evaluated on the ankle films. No other fracture, subluxation or dislocation identified within the left  foot. The Lisfranc joints are intact.  IMPRESSION: Ankle fracture-subluxation/near dislocation, better evaluated on the ankle films.  No other bony abnormalities within the left foot.  Original Report Authenticated By: Rosendo Gros, M.D.    Post-reduction xrays demonstrate reduction of dislocation Bimalleolar ankle fx  A/P: S/p fall at home with left ankle fracture/dislocation Reduced in the ER OK for d/c to home if cleared by PT - crutches NWB L LE F/u early next week with Dr Victorino Dike for definitive fracture management Will require ORIF Patient to call office Monday - [phone] for appointment with Dr Elmer Ramp for pain ad Robaxin for spasms.

## 2012-04-08 NOTE — ED Notes (Addendum)
Pt arrived via [facility], Alert and oriented x 3, accompanied by family at bedside. MD paged and aware that pt. Is here.  Injury to LLE.  Skin is warm, Cap. RF, pulses and color WNL, no edema noted, pt able to wiggle phalanges.

## 2012-04-08 NOTE — ED Provider Notes (Signed)
Procedural sedation Performed by: Lyanne Co Consent: Verbal consent obtained. Risks and benefits: risks, benefits and alternatives were discussed Required items: required blood products, implants, devices, and special equipment available Patient identity confirmed: arm band and provided demographic data Time out: Immediately prior to procedure a "time out" was called to verify the correct patient, procedure, equipment, support staff and site/side marked as required. Sedation type: moderate (conscious) sedation NPO time confirmed and considedered Sedatives: ETOMIDATE Physician Time at Bedside: 18 Vitals: Vital signs were monitored during sedation. Cardiac Monitor, pulse oximeter Patient tolerance: Patient tolerated the procedure well with no immediate complications. Comments: Pt with uneventful recovered. Returned to pre-procedural sedation baseline  Reduction performed by Dr Shon Baton. Pt will be discharged home with Robaxin and Percocet with follow up with orthopedics on Monday. Please see Dr Shon Baton note for additional details     Lyanne Co, MD 04/08/12 2337

## 2012-04-08 NOTE — ED Provider Notes (Signed)
History     CSN: [id number]  Arrival date & time 04/08/12  1740   First MD Initiated Contact with Patient 04/08/12 1750      Chief Complaint  Patient presents with  . Ankle Pain    (Consider location/radiation/quality/duration/timing/severity/associated sxs/prior treatment) HPI Comments: Pt seen with Teressa Lower.  S/p fall with bimalleolar fx of left ankle.  Denies other injury.  Closed fx with good pedal pulses, normal sensation in foot  Patient is a 61 y.o. female presenting with ankle pain. The history is provided by the patient.  Ankle Pain     Past Medical History  Diagnosis Date  . Sleep apnea   . Hypertension   . Hypercholesteremia   . Hypothyroidism     Past Surgical History  Procedure Date  . Tonsillectomy   . Dilation and curettage of uterus   . Carpal tunnel release     right    No family history on file.  History  Substance Use Topics  . Smoking status: Never Smoker   . Smokeless tobacco: Never Used  . Alcohol Use: Yes     ocassionally    OB History    Grav Para Term Preterm Abortions TAB SAB Ect Mult Living   2 1 1  1  1   1       Review of Systems  Allergies  Ciprofloxacin and Naproxen sodium  Home Medications   Current Outpatient Rx  Name Route Sig Dispense Refill  . AMLODIPINE BESYLATE 5 MG PO TABS Oral Take 5 mg by mouth daily.      . ASPIRIN EC 81 MG PO TBEC Oral Take 81 mg by mouth daily.      Marland Kitchen BENAZEPRIL HCL 40 MG PO TABS Oral Take 40 mg by mouth daily.      Marland Kitchen ESTRADIOL 1 MG PO TABS Oral Take 1 mg by mouth daily.      Marland Kitchen FLUOXETINE HCL 20 MG PO CAPS Oral Take 20 mg by mouth daily.      Marland Kitchen LEVOTHYROXINE SODIUM 200 MCG PO TABS Oral Take 100 mcg by mouth daily.      Marland Kitchen MEDROXYPROGESTERONE ACETATE 5 MG PO TABS Oral Take 5 mg by mouth daily.      Marland Kitchen METOPROLOL TARTRATE 100 MG PO TABS Oral Take 100 mg by mouth daily.      Marland Kitchen OMEPRAZOLE MAGNESIUM 20 MG PO TBEC Oral Take 20 mg by mouth daily.      Marland Kitchen PRAVASTATIN SODIUM 80 MG PO TABS  Oral Take 80 mg by mouth daily.        BP 129/61  Pulse 79  Resp 13  SpO2 99%  Physical Exam  Constitutional: She is oriented to person, place, and time. She appears well-developed and well-nourished.       Obese   HENT:  Head: Normocephalic and atraumatic.  Mouth/Throat: Oropharynx is clear and moist.  Eyes: Pupils are equal, round, and reactive to light.  Cardiovascular: Normal rate, regular rhythm and normal heart sounds.   Pulmonary/Chest: Effort normal and breath sounds normal. No respiratory distress.  Abdominal: Soft. There is no tenderness.  Musculoskeletal:       +deformity of left ankle, closed, NV intact  Neurological: She is alert and oriented to person, place, and time.  Skin: Skin is warm and dry.    ED Course  Reduction of dislocation Date/Time: 04/08/2012 8:27 PM Performed by: Gillian Meeuwsen Authorized by: Rolan Bucco Consent: Verbal consent obtained. Written consent obtained. Risks and  benefits: risks, benefits and alternatives were discussed Consent given by: patient Patient identity confirmed: verbally with patient Time out: Immediately prior to procedure a "time out" was called to verify the correct patient, procedure, equipment, support staff and site/side marked as required. Patient tolerance: Patient tolerated the procedure well with no immediate complications.   (including critical care time)  Labs Reviewed - No data to display Dg Ankle 2 Views Left  04/08/2012  *RADIOLOGY REPORT*  Clinical Data: Postreduction.  LEFT ANKLE - 2 VIEW  Comparison: 04/08/2012  Findings: In cast views of the left ankle again demonstrate the fractures in the distal tibia and fibula.  There now appears to be dislocation of the distal tibia relative to the talus on the lateral view.  IMPRESSION: There now appears to be dislocation at the left ankle. Fractures again noted.  Overlying cast material obscures fine bony detail.  Original Report Authenticated By: Cyndie Chime,  M.D.   Dg Ankle Complete Left  04/08/2012  *RADIOLOGY REPORT*  Clinical Data: Left ankle pain and swelling following injury.  LEFT ANKLE COMPLETE - 3+ VIEW  Comparison: None  Findings: There is lateral subluxation/near dislocation at the tibiotalar joint. Fractures of the medial malleolus and anterior distal tibial noted. A mildly comminuted fracture of the distal fibula is present. Soft tissue swelling is present. The talar dome appears intact.  IMPRESSION: Fracture subluxation/near dislocation of the left ankle as described.  Original Report Authenticated By: Rosendo Gros, M.D.   Dg Foot Complete Left  04/08/2012  *RADIOLOGY REPORT*  Clinical Data: Left foot injury and pain.  LEFT FOOT - COMPLETE 3+ VIEW  Comparison: None  Findings: Medial and lateral malleolar fractures and subluxation at the tibiotalar joint are identified but better evaluated on the ankle films. No other fracture, subluxation or dislocation identified within the left foot. The Lisfranc joints are intact.  IMPRESSION: Ankle fracture-subluxation/near dislocation, better evaluated on the ankle films.  No other bony abnormalities within the left foot.  Original Report Authenticated By: Rosendo Gros, M.D.   Procedural sedation Performed by: Rolan Bucco Consent: Verbal consent obtained. Risks and benefits: risks, benefits and alternatives were discussed Required items: required blood products, implants, devices, and special equipment available Patient identity confirmed: arm band and provided demographic data Time out: Immediately prior to procedure a "time out" was called to verify the correct patient, procedure, equipment, support staff and site/side marked as required.  Sedation type: moderate (conscious) sedation NPO time confirmed and considedered  Sedatives: PROPOFOL  Physician Time at Bedside: 15  Vitals: Vital signs were monitored during sedation. Cardiac Monitor, pulse oximeter Patient tolerance: Patient  tolerated the procedure well with no immediate complications. Comments: Pt with uneventful recovered. Returned to pre-procedural sedation baseline     1. Bimalleolar ankle fracture       MDM  Spoke with Dr. Shon Baton, with [place], who recommends reduction here, will call him back after post reduction films.  Risks/benefits of reduction/procedural sedation discussed with pt.    Pt seems to have good alignment post reduction, but apparently during splinting, ankle subluxed again.  Post reduction films do not show any improvement.  Spoke to Dr. Shon Baton, will send to [facility] ED for reduction by him and likely admission for surgical fixation.       Rolan Bucco, MD 04/08/12 2103

## 2012-04-08 NOTE — ED Notes (Signed)
I applied plaster splint material posterior with a stirrup. I first placed stockinette material, then cotton webril wrapped for padding. I wrapped with large kerlix to hold then ace wrapped all. Patient placed under sedation but she was restless during splint.

## 2012-04-08 NOTE — Discharge Instructions (Signed)
Ankle Fracture A fracture is a break in the bone. A cast or splint is used to protect and keep your injured bone from moving.  HOME CARE INSTRUCTIONS   Use your crutches as directed.   To lessen the swelling, keep the injured leg elevated while sitting or lying down.   Apply ice to the injury for 15 to 20 minutes, 3 to 4 times per day while awake for 2 days. Put the ice in a plastic bag and place a thin towel between the bag of ice and your cast.   If you have a plaster or fiberglass cast:   Do not try to scratch the skin under the cast using sharp or pointed objects.   Check the skin around the cast every day. You may put lotion on any red or sore areas.   Keep your cast dry and clean.   If you have a plaster splint:   Wear the splint as directed.   You may loosen the elastic around the splint if your toes become numb, tingle, or turn cold or blue.   Do not put pressure on any part of your cast or splint; it may break. Rest your cast only on a pillow the first 24 hours until it is fully hardened.   Your cast or splint can be protected during bathing with a plastic bag. Do not lower the cast or splint into water.   Take medications as directed by your caregiver. Only take over-the-counter or prescription medicines for pain, discomfort, or fever as directed by your caregiver.   Do not drive a vehicle until your caregiver specifically tells you it is safe to do so.   If your caregiver has given you a follow-up appointment, it is very important to keep that appointment. Not keeping the appointment could result in a chronic or permanent injury, pain, and disability. If there is any problem keeping the appointment, you must call back to this facility for assistance.  SEEK IMMEDIATE MEDICAL CARE IF:   Your cast gets damaged or breaks.   You have continued severe pain or more swelling than you did before the cast was put on.   Your skin or toenails below the injury turn blue or gray,  or feel cold or numb.   There is a bad smell or new stains and/or purulent (pus like) drainage coming from under the cast.  If you do not have a window in your cast for observing the wound, a discharge or minor bleeding may show up as a stain on the outside of your cast. Report these findings to your caregiver. MAKE SURE YOU:   Understand these instructions.   Will watch your condition.   Will get help right away if you are not doing well or get worse.  Document Released: 10/01/2000 Document Revised: 09/23/2011 Document Reviewed: 05/07/2008 Exit[employer] Patient Information 2012 [employer]. 

## 2012-04-08 NOTE — ED Notes (Signed)
Phone report given to Sam, RN with [facility]. Report called to Fleet Contras, RN- charge nurse- at [facility] ED

## 2012-04-08 NOTE — Procedures (Signed)
Assisted with conscious sedation of pt for reduction of Lf anle. Pt placed on 10 liters per physician nasal cannula. Pt rolls placed under pt's shoulders for sniffing position. Pt maintained a saturation of 100% pre and post procedure. Pt placed back on 4 liters. Pt tolerated well.

## 2012-04-08 NOTE — ED Notes (Signed)
Pt arrived via [facility] after fall while walking through yard.  Obvious deformity to L ankle.  Pt received Fentanyl and 4 Zofran in route to ED.  Pt arrived splinted with ice pack in place.

## 2012-04-08 NOTE — ED Notes (Signed)
Patient transported to X-ray 

## 2012-04-08 NOTE — ED Notes (Signed)
Patient is being transferred by [facility] to  ed

## 2012-04-09 MED ORDER — ONDANSETRON HCL 4 MG/2ML IJ SOLN
4.0000 mg | Freq: Once | INTRAMUSCULAR | Status: AC
  Administered 2012-04-09: 4 mg via INTRAVENOUS
  Filled 2012-04-09: qty 2

## 2012-04-09 MED ORDER — SODIUM CHLORIDE 0.9 % IV SOLN
INTRAVENOUS | Status: AC | PRN
  Administered 2012-04-08: 150 mL/h via INTRAVENOUS

## 2012-04-09 MED ORDER — FENTANYL CITRATE 0.05 MG/ML IJ SOLN
INTRAMUSCULAR | Status: AC | PRN
  Administered 2012-04-08: 100 ug via INTRAVENOUS

## 2012-04-09 MED ORDER — ETOMIDATE 2 MG/ML IV SOLN
INTRAVENOUS | Status: AC | PRN
  Administered 2012-04-08: 20 mg via INTRAVENOUS

## 2012-04-09 MED ORDER — ONDANSETRON HCL 4 MG/2ML IJ SOLN
INTRAMUSCULAR | Status: AC | PRN
  Administered 2012-04-08: 4 mg via INTRAVENOUS

## 2012-04-09 MED ORDER — ONDANSETRON 8 MG PO TBDP: 8.0000 mg | ORAL_TABLET | Freq: Three times a day (TID) | ORAL | Status: AC | PRN

## 2012-04-09 NOTE — ED Notes (Signed)
2315-Dr.Brooks,Campos at bedside for moderation sedation, existing splint removed by Dr. Shon Baton, + PMS, consents signed by patient for sedation and reduction. Pt connected to CCM, on 2L 02 by [place], IV patent, BVM and suction ready. Code cart at doorway.  2325-ortho tech at bedside, Fentanyl IVP given. 2326-Etomidate 20mg  IVP given 2327-pt unresponsive to verbal stimuli, snoring respirations noted, pt noted to being desating low 90's, jaw thrust performed by Dr. Patria Mane 2328-reduction complete by Dr. Shon Baton, pt continued to desat, BVM used for 3 min, pt quickly began perfusing sats back up in 90s.  2332-pt open eyes to verbal stimulation, noted to be diaphoretic, VSS, pt returned to [place] 02. 2342-emesis noted, Zofran given. Pt sitting up able to hold emesis bag, controlling secretions.  2344-pt talking with staff, xray at bedside.

## 2012-04-09 NOTE — ED Notes (Signed)
Pt has crutches from family at bedside. She was fitted appropriately. NWB to LLE encouraged. Wheeled to car and assistance was given to help pt get in the car.

## 2012-04-11 ENCOUNTER — Encounter (HOSPITAL_BASED_OUTPATIENT_CLINIC_OR_DEPARTMENT_OTHER): Payer: Self-pay | Admitting: *Deleted

## 2012-04-11 NOTE — Progress Notes (Signed)
To come in for bmet Had ekg 12/12 Pt had sleep apnea-mild-moderate-could not use cpap Will need to stay overnight if needed To bring all meds and overnight bag

## 2012-04-12 ENCOUNTER — Encounter (HOSPITAL_BASED_OUTPATIENT_CLINIC_OR_DEPARTMENT_OTHER)
Admission: RE | Admit: 2012-04-12 | Discharge: 2012-04-12 | Disposition: A | Discharge status: Home / Self Care | Payer: Self-pay | Source: Ambulatory Visit | Attending: Orthopedic Surgery | Admitting: Orthopedic Surgery

## 2012-04-12 LAB — BASIC METABOLIC PANEL
BUN: 10 mg/dL (ref 6–23)
CO2: 25 mEq/L (ref 19–32)
Chloride: 102 mEq/L (ref 96–112)
Creatinine, Ser: 0.67 mg/dL (ref 0.50–1.10)
Glucose, Bld: 107 mg/dL — ABNORMAL HIGH (ref 70–99)

## 2012-04-13 ENCOUNTER — Ambulatory Visit (HOSPITAL_BASED_OUTPATIENT_CLINIC_OR_DEPARTMENT_OTHER): Payer: Self-pay | Admitting: Certified Registered Nurse Anesthetist

## 2012-04-13 ENCOUNTER — Encounter (HOSPITAL_BASED_OUTPATIENT_CLINIC_OR_DEPARTMENT_OTHER): Payer: Self-pay | Admitting: Certified Registered Nurse Anesthetist

## 2012-04-13 ENCOUNTER — Encounter (HOSPITAL_BASED_OUTPATIENT_CLINIC_OR_DEPARTMENT_OTHER): Payer: Self-pay

## 2012-04-13 ENCOUNTER — Encounter (HOSPITAL_BASED_OUTPATIENT_CLINIC_OR_DEPARTMENT_OTHER)
Admission: RE | Disposition: A | Discharge status: Home / Self Care | Payer: Self-pay | Source: Ambulatory Visit | Attending: Orthopedic Surgery

## 2012-04-13 ENCOUNTER — Ambulatory Visit (HOSPITAL_COMMUNITY): Payer: Self-pay

## 2012-04-13 ENCOUNTER — Ambulatory Visit (HOSPITAL_BASED_OUTPATIENT_CLINIC_OR_DEPARTMENT_OTHER)
Admission: RE | Admit: 2012-04-13 | Discharge: 2012-04-13 | Disposition: A | Discharge status: Home / Self Care | Payer: Self-pay | Source: Ambulatory Visit | Attending: Orthopedic Surgery | Admitting: Orthopedic Surgery

## 2012-04-13 DIAGNOSIS — I739 Peripheral vascular disease, unspecified: Secondary | ICD-10-CM | POA: Insufficient documentation

## 2012-04-13 DIAGNOSIS — S82853A Displaced trimalleolar fracture of unspecified lower leg, initial encounter for closed fracture: Secondary | ICD-10-CM | POA: Insufficient documentation

## 2012-04-13 DIAGNOSIS — F329 Major depressive disorder, single episode, unspecified: Secondary | ICD-10-CM | POA: Insufficient documentation

## 2012-04-13 DIAGNOSIS — I10 Essential (primary) hypertension: Secondary | ICD-10-CM | POA: Insufficient documentation

## 2012-04-13 DIAGNOSIS — S93439A Sprain of tibiofibular ligament of unspecified ankle, initial encounter: Secondary | ICD-10-CM | POA: Insufficient documentation

## 2012-04-13 DIAGNOSIS — E7800 Pure hypercholesterolemia, unspecified: Secondary | ICD-10-CM | POA: Insufficient documentation

## 2012-04-13 DIAGNOSIS — X500XXA Overexertion from strenuous movement or load, initial encounter: Secondary | ICD-10-CM | POA: Insufficient documentation

## 2012-04-13 DIAGNOSIS — G4730 Sleep apnea, unspecified: Secondary | ICD-10-CM | POA: Insufficient documentation

## 2012-04-13 DIAGNOSIS — E039 Hypothyroidism, unspecified: Secondary | ICD-10-CM | POA: Insufficient documentation

## 2012-04-13 DIAGNOSIS — Y92009 Unspecified place in unspecified non-institutional (private) residence as the place of occurrence of the external cause: Secondary | ICD-10-CM | POA: Insufficient documentation

## 2012-04-13 DIAGNOSIS — M129 Arthropathy, unspecified: Secondary | ICD-10-CM | POA: Insufficient documentation

## 2012-04-13 DIAGNOSIS — S93429A Sprain of deltoid ligament of unspecified ankle, initial encounter: Secondary | ICD-10-CM | POA: Insufficient documentation

## 2012-04-13 DIAGNOSIS — K219 Gastro-esophageal reflux disease without esophagitis: Secondary | ICD-10-CM | POA: Insufficient documentation

## 2012-04-13 DIAGNOSIS — F3289 Other specified depressive episodes: Secondary | ICD-10-CM | POA: Insufficient documentation

## 2012-04-13 HISTORY — PX: ORIF ANKLE FRACTURE: SHX5408

## 2012-04-13 HISTORY — DX: Unspecified osteoarthritis, unspecified site: M19.90

## 2012-04-13 HISTORY — DX: Depression, unspecified: F32.A

## 2012-04-13 HISTORY — DX: Peripheral vascular disease, unspecified: I73.9

## 2012-04-13 HISTORY — DX: Gastro-esophageal reflux disease without esophagitis: K21.9

## 2012-04-13 HISTORY — DX: Major depressive disorder, single episode, unspecified: F32.9

## 2012-04-13 LAB — POCT HEMOGLOBIN-HEMACUE: Hemoglobin: 13.6 g/dL (ref 12.0–15.0)

## 2012-04-13 SURGERY — OPEN REDUCTION INTERNAL FIXATION (ORIF) ANKLE FRACTURE
Anesthesia: General | Site: Ankle | Laterality: Left | Wound class: Clean

## 2012-04-13 MED ORDER — MIDAZOLAM HCL 5 MG/5ML IJ SOLN
INTRAMUSCULAR | Status: DC | PRN
  Administered 2012-04-13: 1 mg via INTRAVENOUS

## 2012-04-13 MED ORDER — OXYCODONE HCL 5 MG PO TABS: 5.0000 mg | ORAL_TABLET | Freq: Once | ORAL | Status: DC | PRN

## 2012-04-13 MED ORDER — ONDANSETRON HCL 4 MG/2ML IJ SOLN
INTRAMUSCULAR | Status: DC | PRN
  Administered 2012-04-13: 4 mg via INTRAVENOUS

## 2012-04-13 MED ORDER — LIDOCAINE HCL (CARDIAC) 20 MG/ML IV SOLN
INTRAVENOUS | Status: DC | PRN
  Administered 2012-04-13: 30 mg via INTRAVENOUS

## 2012-04-13 MED ORDER — ASPIRIN EC 81 MG PO TBEC: 325.0000 mg | DELAYED_RELEASE_TABLET | Freq: Two times a day (BID) | ORAL | Status: DC

## 2012-04-13 MED ORDER — OXYCODONE HCL 5 MG PO TABS: 5.0000 mg | ORAL_TABLET | Freq: Once | ORAL | Status: AC | PRN

## 2012-04-13 MED ORDER — METOCLOPRAMIDE HCL 5 MG/ML IJ SOLN
10.0000 mg | Freq: Once | INTRAMUSCULAR | Status: AC | PRN
  Administered 2012-04-13: 10 mg via INTRAVENOUS

## 2012-04-13 MED ORDER — LIDOCAINE-EPINEPHRINE 1.5-1:200000 % IJ SOLN
INTRAMUSCULAR | Status: DC | PRN
  Administered 2012-04-13: 25 mL via INTRADERMAL

## 2012-04-13 MED ORDER — FENTANYL CITRATE 0.05 MG/ML IJ SOLN
25.0000 ug | INTRAMUSCULAR | Status: DC | PRN
  Administered 2012-04-13 (×3): 50 ug via INTRAVENOUS

## 2012-04-13 MED ORDER — LIDOCAINE HCL 1 % IJ SOLN
INTRAMUSCULAR | Status: DC | PRN
  Administered 2012-04-13: 3 mL via INTRADERMAL

## 2012-04-13 MED ORDER — MIDAZOLAM HCL 2 MG/2ML IJ SOLN
0.5000 mg | INTRAMUSCULAR | Status: DC | PRN
  Administered 2012-04-13: 2 mg via INTRAVENOUS

## 2012-04-13 MED ORDER — ROPIVACAINE HCL 5 MG/ML IJ SOLN
INTRAMUSCULAR | Status: DC | PRN
  Administered 2012-04-13: 25 mL via EPIDURAL

## 2012-04-13 MED ORDER — PROPOFOL 10 MG/ML IV EMUL
INTRAVENOUS | Status: DC | PRN
  Administered 2012-04-13: 200 mg via INTRAVENOUS

## 2012-04-13 MED ORDER — LACTATED RINGERS IV SOLN
INTRAVENOUS | Status: DC
  Administered 2012-04-13: 10 mL/h via INTRAVENOUS
  Administered 2012-04-13 (×3): via INTRAVENOUS

## 2012-04-13 MED ORDER — BACITRACIN ZINC 500 UNIT/GM EX OINT
TOPICAL_OINTMENT | CUTANEOUS | Status: DC | PRN
  Administered 2012-04-13: 1 via TOPICAL

## 2012-04-13 MED ORDER — DEXAMETHASONE SODIUM PHOSPHATE 10 MG/ML IJ SOLN
INTRAMUSCULAR | Status: DC | PRN
  Administered 2012-04-13: 10 mg via INTRAVENOUS

## 2012-04-13 MED ORDER — CEFAZOLIN SODIUM 1-5 GM-% IV SOLN
INTRAVENOUS | Status: DC | PRN
  Administered 2012-04-13: 2 g via INTRAVENOUS

## 2012-04-13 MED ORDER — FENTANYL CITRATE 0.05 MG/ML IJ SOLN
50.0000 ug | INTRAMUSCULAR | Status: DC | PRN
  Administered 2012-04-13: 100 ug via INTRAVENOUS

## 2012-04-13 SURGICAL SUPPLY — 76 items
ANCHOR SCREW TI W/SUT 3 (Screw) ×1 IMPLANT
BAG DECANTER FOR FLEXI CONT (MISCELLANEOUS) IMPLANT
BANDAGE ESMARK 6X9 LF (GAUZE/BANDAGES/DRESSINGS) ×1 IMPLANT
BIT DRILL 2.5X2.75 QC CALB (BIT) ×1 IMPLANT
BIT DRILL 3.5X5.5 QC CALB (BIT) ×1 IMPLANT
BLADE SURG 15 STRL LF DISP TIS (BLADE) ×2 IMPLANT
BLADE SURG 15 STRL SS (BLADE) ×4
BNDG CMPR 9X6 STRL LF SNTH (GAUZE/BANDAGES/DRESSINGS) ×1
BNDG COHESIVE 4X5 TAN STRL (GAUZE/BANDAGES/DRESSINGS) ×2 IMPLANT
BNDG COHESIVE 6X5 TAN STRL LF (GAUZE/BANDAGES/DRESSINGS) ×2 IMPLANT
BNDG ESMARK 6X9 LF (GAUZE/BANDAGES/DRESSINGS) ×2
CHLORAPREP W/TINT 26ML (MISCELLANEOUS) ×2 IMPLANT
CLOTH BEACON ORANGE TIMEOUT ST (SAFETY) ×2 IMPLANT
COVER TABLE BACK 60X90 (DRAPES) ×2 IMPLANT
CUFF TOURNIQUET SINGLE 34IN LL (TOURNIQUET CUFF) ×1 IMPLANT
DECANTER SPIKE VIAL GLASS SM (MISCELLANEOUS) IMPLANT
DRAPE C-ARM 42X72 X-RAY (DRAPES) ×2 IMPLANT
DRAPE EXTREMITY T 121X128X90 (DRAPE) ×2 IMPLANT
DRAPE INCISE IOBAN 66X45 STRL (DRAPES) ×2 IMPLANT
DRAPE U-SHAPE 47X51 STRL (DRAPES) ×2 IMPLANT
DRAPE U-SHAPE 76X120 STRL (DRAPES) ×2 IMPLANT
DRESSING ADAPTIC 1/2  N-ADH (PACKING) IMPLANT
DRSG EMULSION OIL 3X3 NADH (GAUZE/BANDAGES/DRESSINGS) ×4 IMPLANT
DRSG PAD ABDOMINAL 8X10 ST (GAUZE/BANDAGES/DRESSINGS) ×4 IMPLANT
ELECT REM PT RETURN 9FT ADLT (ELECTROSURGICAL) ×2
ELECTRODE REM PT RTRN 9FT ADLT (ELECTROSURGICAL) ×1 IMPLANT
GLOVE BIO SURGEON STRL SZ7 (GLOVE) ×2 IMPLANT
GLOVE BIO SURGEON STRL SZ8 (GLOVE) ×2 IMPLANT
GLOVE BIOGEL PI IND STRL 7.0 (GLOVE) ×1 IMPLANT
GLOVE BIOGEL PI IND STRL 8 (GLOVE) ×1 IMPLANT
GLOVE BIOGEL PI INDICATOR 7.0 (GLOVE) ×1
GLOVE BIOGEL PI INDICATOR 8 (GLOVE) ×1
GLOVE ECLIPSE 6.5 STRL STRAW (GLOVE) ×2 IMPLANT
GOWN PREVENTION PLUS XLARGE (GOWN DISPOSABLE) ×4 IMPLANT
GOWN PREVENTION PLUS XXLARGE (GOWN DISPOSABLE) ×2 IMPLANT
K-WIRE ACE 1.6X6 (WIRE) ×2
KWIRE ACE 1.6X6 (WIRE) ×1 IMPLANT
NEEDLE HYPO 22GX1.5 SAFETY (NEEDLE) IMPLANT
PACK BASIN DAY SURGERY FS (CUSTOM PROCEDURE TRAY) ×2 IMPLANT
PAD CAST 4YDX4 CTTN HI CHSV (CAST SUPPLIES) ×3 IMPLANT
PADDING CAST COTTON 4X4 STRL (CAST SUPPLIES) ×2
PADDING CAST COTTON 6X4 STRL (CAST SUPPLIES) ×2 IMPLANT
PENCIL BUTTON HOLSTER BLD 10FT (ELECTRODE) ×2 IMPLANT
PLATE ACE 100DEG 8HOLE (Plate) ×1 IMPLANT
SCREW CORTICAL 3.5MM  12MM (Screw) ×2 IMPLANT
SCREW CORTICAL 3.5MM  16MM (Screw) ×1 IMPLANT
SCREW CORTICAL 3.5MM  20MM (Screw) ×1 IMPLANT
SCREW CORTICAL 3.5MM  44MM (Screw) ×1 IMPLANT
SCREW CORTICAL 3.5MM 12MM (Screw) IMPLANT
SCREW CORTICAL 3.5MM 14MM (Screw) ×4 IMPLANT
SCREW CORTICAL 3.5MM 16MM (Screw) IMPLANT
SCREW CORTICAL 3.5MM 18MM (Screw) ×1 IMPLANT
SCREW CORTICAL 3.5MM 20MM (Screw) ×1 IMPLANT
SCREW CORTICAL 3.5MM 44MM (Screw) ×1 IMPLANT
SCREW CORTICAL 3.5MM 48MM (Screw) ×2 IMPLANT
SHEET MEDIUM DRAPE 40X70 STRL (DRAPES) ×2 IMPLANT
SPLINT FAST PLASTER 5X30 (CAST SUPPLIES) ×20
SPLINT PLASTER CAST FAST 5X30 (CAST SUPPLIES) ×20 IMPLANT
SPONGE GAUZE 4X4 12PLY (GAUZE/BANDAGES/DRESSINGS) ×2 IMPLANT
SPONGE LAP 18X18 X RAY DECT (DISPOSABLE) ×2 IMPLANT
STAPLER VISISTAT 35W (STAPLE) IMPLANT
STOCKINETTE 6  STRL (DRAPES) ×1
STOCKINETTE 6 STRL (DRAPES) ×1 IMPLANT
STRIP CLOSURE SKIN 1/2X4 (GAUZE/BANDAGES/DRESSINGS) IMPLANT
SUCTION FRAZIER TIP 10 FR DISP (SUCTIONS) ×2 IMPLANT
SUT MNCRL AB 3-0 PS2 18 (SUTURE) ×4 IMPLANT
SUT PROLENE 3 0 PS 2 (SUTURE) ×2 IMPLANT
SUT VIC AB 0 SH 27 (SUTURE) ×2 IMPLANT
SUT VIC AB 2-0 SH 27 (SUTURE)
SUT VIC AB 2-0 SH 27XBRD (SUTURE) IMPLANT
SUT VICRYL 4-0 PS2 18IN ABS (SUTURE) IMPLANT
SYR BULB 3OZ (MISCELLANEOUS) ×2 IMPLANT
SYR CONTROL 10ML LL (SYRINGE) IMPLANT
TUBE CONNECTING 20X1/4 (TUBING) ×2 IMPLANT
UNDERPAD 30X30 INCONTINENT (UNDERPADS AND DIAPERS) ×2 IMPLANT
WATER STERILE IRR 1000ML POUR (IV SOLUTION) IMPLANT

## 2012-04-13 NOTE — Anesthesia Procedure Notes (Addendum)
Anesthesia Regional Block:  Popliteal block  Pre-Anesthetic Checklist: ,, timeout performed, Correct Patient, Correct Site, Correct Laterality, Correct Procedure, Correct Position, site marked, Risks and benefits discussed,  Surgical consent,  Pre-op evaluation,  At surgeon's request and post-op pain management  Laterality: Left  Prep: chloraprep       Needles:   Needle Type: Other   (Arrow Echogenic)   Needle Length: 9cm  Needle Gauge: 21    Additional Needles:  Procedures: ultrasound guided Popliteal block Narrative:  Start time: 04/13/2012 8:50 AM End time: 04/13/2012 9:01 AM Injection made incrementally with aspirations every 5 mL.  Performed by: Personally  Anesthesiologist: Aldona Lento, MD  Additional Notes: Ultrasound guidance used to: id relevant anatomy, confirm needle position, local anesthetic spread, avoidance of vascular puncture. Picture saved. No complications. Block performed personally by Janetta Hora. Gelene Mink, MD  .    Popliteal block Procedure Name: LMA Insertion Date/Time: 04/13/2012 9:21 AM Performed by: Torrian Canion D Pre-anesthesia Checklist: Patient identified, Emergency Drugs available, Suction available and Patient being monitored Patient Re-evaluated:Patient Re-evaluated prior to inductionOxygen Delivery Method: Circle System Utilized Preoxygenation: Pre-oxygenation with 100% oxygen Intubation Type: IV induction Ventilation: Mask ventilation without difficulty LMA: LMA inserted LMA Size: 4.0 Number of attempts: 1 Placement Confirmation: positive ETCO2 Tube secured with: Tape Dental Injury: Teeth and Oropharynx as per pre-operative assessment

## 2012-04-13 NOTE — Anesthesia Postprocedure Evaluation (Signed)
Anesthesia Post Note  Patient: [name]  Procedure(s) Performed: Procedure(s) (LRB): OPEN REDUCTION INTERNAL FIXATION (ORIF) ANKLE FRACTURE (Left)  Anesthesia type: General  Patient location: PACU  Post pain: Pain level controlled  Post assessment: Patient's Cardiovascular Status Stable  Last Vitals:  Filed Vitals:   04/13/12 1300  BP: 119/61  Pulse: 59  Temp:   Resp: 14    Post vital signs: Reviewed and stable  Level of consciousness: alert  Complications: No apparent anesthesia complications

## 2012-04-13 NOTE — Op Note (Signed)
[name], [name]                 ACCOUNT NO.:  [id number]  MEDICAL RECORD NO.:  [id number]  LOCATION:                                 FACILITY:  PHYSICIAN:  Toni Arthurs, MD        DATE OF BIRTH:  09/09/1951  DATE OF PROCEDURE:  04/13/2012 DATE OF DISCHARGE:                              OPERATIVE REPORT   PREOPERATIVE DIAGNOSES: 1. Left ankle trimalleolar fracture. 2. Left deltoid ligament disruption.  POSTOPERATIVE DIAGNOSES: 1. Left ankle trimalleolar fracture. 2. Left ankle deltoid ligament disruption. 3. Left ankle syndesmosis disruption.  PROCEDURE: 1. Open reduction and internal fixation of left ankle trimalleolar     fracture. 2. Open reduction and internal fixation of left ankle syndesmosis     disruption. 3. Repair of left ankle deltoid ligament. 4. Intraoperative interpretation of fluoroscopic imaging greater than     1 hour. 5. Stress examination of left ankle under fluoroscopy.  SURGEON:  Toni Arthurs, M.D.  ANESTHESIA:  General, regional.  ESTIMATED BLOOD LOSS:  Minimal.  TOURNIQUET TIME:  81 minute at 225 mmHg.  COMPLICATIONS:  None apparent.  DISPOSITION:  Extubated, awake, and stable to recovery.  INDICATIONS FOR PROCEDURE:  The patient is a 61 year old woman who sustained an injury to her left ankle approximately a week ago.  She had a fracture dislocation that was provisionally reduced in the emergency department.  Reduction was lost and Dr. Shon Anderson again provisionally reduced it in the [facility] Emergency Department.  She was splinted. X-rays revealed a trimalleolar fracture with complete disruption of the deltoid ligament.  She presents now for operative treatment of this unstable displaced injury.  She understands risks and benefits, the alternative treatment options and elects surgical treatment.  She specifically understands risks of bleeding, infection, nerve damage, blood clots, need for additional surgery, amputation, and  death.  PROCEDURE:  After preoperative consent was obtained, the correct operative site was identified.  The patient was brought to the operating room and placed supine in the operating table.  General anesthesia was induced.  Preoperative antibiotics were administered.  Surgical time-out was taken.  Left lower extremity was prepped and draped in standard sterile fashion over the tourniquet around the thigh.  The extremity was exsanguinated and tourniquet was inflated to 225 mmHg.  A longitudinal incision was made over the lateral malleolus.  Sharp dissection was carried down through the skin.  Blunt dissection was carried down through the subcutaneous tissue, taken care to protect the branches of the superficial peroneal nerve.  The fracture site was identified.  It was noted to be a Weber B fracture with comminution of the anterior portion of the proximal fragment creating a segmental area of comminution.  The fracture was reduced and held with a lobster-claw.  A 3.5-mm fully-threaded lag screw was inserted from anterior to posterior across the fracture site.  A second lag screw was inserted more distally securing another segment of the comminuted fracture in lag fashion.  An 8-hole 1/3 tubular plate was then contoured to fit the lateral aspect of the fibula, it was secured distally with 2 unicortical screws.  This was all the fixation possible distal to the  fracture site.  Proximally, it was fixed with 3 bicortical screws.  At this point, a mortise view was obtained.  Dorsiflexion and external rotation stress was applied under live fluoroscopy, significant widening was noted at the medial clear space and at the syndesmosis.  Based on the evidence of syndesmosis disruption, the decision was made to proceed with open reduction and internal fixation of the syndesmosis.  A medial longitudinal incision was made over the tip of the medial malleolus.  Blunt dissection was carried down  through the subcutaneous tissue.  The deltoid ligament was noted to be disrupted and avulsed off the medial malleolus, this included both the deep and superficial portions of the deltoid over its entire distance from the anterior joint line all the way to the posterior tibial tendon.  The torn fibers of the deltoid ligament were removed from the medial malleolus.  The bone was roughened with a rongeur.  A 3.0-mm titanium anchor was inserted after pre-drilling with a 1.6-mm K-wire.  The anchor was inserted and was noted to have appropriate purchase.  The 2 limbs of suture were then passed through the fibers of the deep deltoid ligament distally.  A Weber tenaculum was then placed from the malleolus medially across to the plate laterally.  It was compressed with the syndesmosis reduced appropriately.  AP and lateral views showed reduction of the syndesmosis on both views.  Two 3.5 mm fully-threaded screws were then inserted from the fibular plate laterally across the syndesmosis and into the tibia across 4 cortices of bone.  AP and lateral views again showed appropriate position of the screws and appropriate reduction of the syndesmosis.  At this point, the deltoid ligament sutures were then tied down.  The talar tilt was corrected as visualized on the fluoroscopy views.  The deltoid ligament was then further repaired with simple sutures of #2 MaxBraid.  Final AP, lateral, and mortise views were obtained showing appropriate reduction of the fractures and appropriate position of the syndesmosis.  All hardware was at the appropriate position and length.  Both wounds were irrigated copiously.  Inverted simple sutures of 3-0 Monocryl were used to close the subcutaneous tissue.  A running 3-0 Prolene was used to close the skin incision medially.  Laterally 0 Vicryl sutures were used to close the periosteum over the plate.  The subcutaneous tissue was closed with inverted simple sutures of 3-0  Monocryl and the skin was closed with a running 3-0 Prolene.  Sterile dressings were applied followed by a well-padded short- leg splint.  Tourniquet was released at 1 hour 21 minute.  The patient was awakened by anesthesia and transported to recovery room in stable condition.  FOLLOWUP PLAN:  The patient will be nonweightbearing on her left lower extremity.  She will follow up with me in 2 weeks for suture removal and conversion to a short-leg cast.    Toni Arthurs, MD    JH/MEDQ  D:  04/13/2012  T:  04/13/2012  Job:  [id number]

## 2012-04-13 NOTE — Brief Op Note (Signed)
04/13/2012  11:05 AM  PATIENT:  [name]  61 y.o. female  PRE-OPERATIVE DIAGNOSIS:   Left ankle trimalleolar fracture and deltoid ligament disruption  POST-OPERATIVE DIAGNOSIS:  Left ankle trimalleolar fracture, deltoid ligament disruption and syndesmosis disruption  Procedure(s): 1.  ORIF left ankle trimalleolar fracture 2.  ORIF left ankle syndesmosis 3.  Repair of left ankle deltoid ligament 4.  Stress exam of left ankle under fluoro 5.  Fluoro > 1 hour  SURGEON:  Toni Arthurs, MD  ASSISTANT: n/a  ANESTHESIA:   General, regional  EBL:  minimal   TOURNIQUET:   Total Tourniquet Time Documented: Thigh (Left) - 81 minutes  COMPLICATIONS:  None apparent  DISPOSITION:  Extubated, awake and stable to recovery.  DICTATION ID:  [id number]

## 2012-04-13 NOTE — Transfer of Care (Signed)
Immediate Anesthesia Transfer of Care Note  Patient: [name]  Procedure(s) Performed: Procedure(s) (LRB): OPEN REDUCTION INTERNAL FIXATION (ORIF) ANKLE FRACTURE (Left)  Patient Location: PACU  Anesthesia Type: GA combined with regional for post-op pain  Level of Consciousness: awake, alert , oriented and patient cooperative  Airway & Oxygen Therapy: Patient Spontanous Breathing and Patient connected to face mask oxygen  Post-op Assessment: Report given to PACU RN and Post -op Vital signs reviewed and stable  Post vital signs: Reviewed and stable  Complications: No apparent anesthesia complications

## 2012-04-13 NOTE — Discharge Instructions (Addendum)
Amy Hewitt, MD °[facility] ° °Please read the following information regarding your care after surgery. ° °Medications  °You only need a prescription for the narcotic pain medicine (ex. oxycodone, Percocet, Norco).  All of the other medicines listed below are available over the counter. °X acetominophen (Tylenol) 650 mg every 4-6 hours as you need for minor pain °X oxycodone as prescribed for moderate to severe pain °?  ° °Narcotic pain medicine (ex. oxycodone, Percocet, Vicodin) will cause constipation.  To prevent this problem, take the following medicines while you are taking any pain medicine. °X docusate sodium (Colace) 100 mg twice a day X senna (Senokot) 2 tablets twice a day ° °X To help prevent blood clots, take an aspirin (325 mg) once a day for a month after surgery.  You should also get up every hour while you are awake to move around.   ° °Weight Bearing °? Bear weight when you are able on your operated leg or foot. °? Bear weight only on the heel of your operated foot in the post-op shoe. °X Do not bear any weight on the operated leg or foot. ° °Cast / Splint / Dressing °X Keep your splint or cast clean and dry.  Don’t put anything (coat hanger, pencil, etc) down inside of it.  If it gets damp, use a hair dryer on the cool setting to dry it.  If it gets soaked, call the office to schedule an appointment for a cast change. °? Remove your dressing 3 days after surgery and cover the incisions with dry dressings.   ° °After your dressing, cast or splint is removed; you may shower, but do not soak or scrub the wound.  Allow the water to run over it, and then gently pat it dry. ° °Swelling °It is normal for you to have swelling where you had surgery.  To reduce swelling and pain, keep your toes above your nose for at least 3 days after surgery.  It may be necessary to keep your foot or leg elevated for several weeks.  If it hurts, it should be elevated. ° °Follow Up °Call my office at  [phone] when you are discharged from the hospital or surgery center to schedule an appointment to be seen two weeks after surgery. ° °Call my office at [phone] if you develop a fever >101.5° F, nausea, vomiting, bleeding from the surgical site or severe pain.   ° ° °Regional Anesthesia Blocks ° °1. Numbness or the inability to move the "blocked" extremity may last from 3-48 hours after placement. The length of time depends on the medication injected and your individual response to the medication. If the numbness is not going away after 48 hours, call your surgeon. ° °2. The extremity that is blocked will need to be protected until the numbness is gone and the  Strength has returned. Because you cannot feel it, you will need to take extra care to avoid injury. Because it may be weak, you may have difficulty moving it or using it. You may not know what position it is in without looking at it while the block is in effect. ° °3. For blocks in the legs and feet, returning to weight bearing and walking needs to be done carefully. You will need to wait until the numbness is entirely gone and the strength has returned. You should be able to move your leg and foot normally before you try and bear weight or walk. You will need someone to be   with you when you first try to ensure you do not fall and possibly risk injury. ° °4. Bruising and tenderness at the needle site are common side effects and will resolve in a few days. ° °5. Persistent numbness or new problems with movement should be communicated to the surgeon or the [facility] ([phone])/ [facility] ([phone]). ° ° ° ° °Post Anesthesia Home Care Instructions ° °Activity: °Get plenty of rest for the remainder of the day. A responsible adult should stay with you for 24 hours following the procedure.  °For the next 24 hours, DO NOT: °-Drive a car °-Operate machinery °-Drink alcoholic beverages °-Take any medication unless  instructed by your physician °-Make any legal decisions or sign important papers. ° °Meals: °Start with liquid foods such as gelatin or soup. Progress to regular foods as tolerated. Avoid greasy, spicy, heavy foods. If nausea and/or vomiting occur, drink only clear liquids until the nausea and/or vomiting subsides. Call your physician if vomiting continues. ° °Special Instructions/Symptoms: °Your throat may feel dry or sore from the anesthesia or the breathing tube placed in your throat during surgery. If this causes discomfort, gargle with warm salt water. The discomfort should disappear within 24 hours. ° °

## 2012-04-13 NOTE — H&P (Signed)
[name] is an 61 y.o. female.   Chief Complaint: left ankle injury HPI: 61 y/o female with left ankle injury about a week ago.  She underwent closed reduction and splinting of her trimal fx dislocation in the ER by Dr. Shon Baton.  She presents now for surgical treatment of this displaced unstable injury.  Past Medical History  Diagnosis Date  . Hypertension   . Hypercholesteremia   . Hypothyroidism   . Sleep apnea     mild-modreate-tried cpap  . GERD (gastroesophageal reflux disease)   . Depression   . Peripheral vascular disease   . Arthritis     Past Surgical History  Procedure Date  . Tonsillectomy   . Dilation and curettage of uterus   . Carpal tunnel release     right  . Colonoscopy     No family history on file. Social History:  reports that she has never smoked. She has never used smokeless tobacco. She reports that she drinks alcohol. She reports that she does not use illicit drugs.  Allergies:  Allergies  Allergen Reactions  . Ciprofloxacin Itching  . Naproxen Sodium Other (See Comments)    Stomach burns     No prescriptions prior to admission    Results for orders placed during the hospital encounter of 05/02/12 (from the past 48 hour(s))  BASIC METABOLIC PANEL     Status: Abnormal   Collection Time   04/12/12 11:00 AM      Component Value Range Comment   Sodium 140  135 - 145 mEq/L    Potassium 3.8  3.5 - 5.1 mEq/L    Chloride 102  96 - 112 mEq/L    CO2 25  19 - 32 mEq/L    Glucose, Bld 107 (*) 70 - 99 mg/dL    BUN 10  6 - 23 mg/dL    Creatinine, Ser 9.14  0.50 - 1.10 mg/dL    Calcium 9.5  8.4 - 78.2 mg/dL    GFR calc non Af Amer >90  >90 mL/min    GFR calc Af Amer >90  >90 mL/min    No results found.  ROS  No recent f /c / n/ v /wt loss.  There were no vitals taken for this visit. Physical Exam wn wd woman in nad.  A and O x 4.  Mood and affect normal.  EOMI.  Resp unlabored. L LE with healthy and intact skin.  Sens to LT intact.  Pulses  palpable.  5/5 strength at toes.  No lymphadenopathy.  Assessment/Plan Left ankle trimal fx - to OR for ORIF.  The risks and benefits of the alternative treatment options have been discussed in detail.  The patient wishes to proceed with surgery and specifically understands risks of bleeding, infection, nerve damage, blood clots, need for additional surgery, amputation and death.    Toni Arthurs 02-May-2012, 7:17 AM

## 2012-04-13 NOTE — Anesthesia Preprocedure Evaluation (Signed)
Anesthesia Evaluation  Patient identified by MRN, date of birth, ID band Patient awake    Reviewed: Allergy & Precautions, H&P , NPO status , Patient's Chart, lab work & pertinent test results, reviewed documented beta blocker date and time   Airway Mallampati: II TM Distance: >3 FB Neck ROM: full    Dental   Pulmonary sleep apnea ,          Cardiovascular hypertension, Pt. on medications and Pt. on home beta blockers     Neuro/Psych PSYCHIATRIC DISORDERS Depression negative neurological ROS     GI/Hepatic Neg liver ROS, GERD-  Medicated and Controlled,  Endo/Other  Hypothyroidism Morbid obesity  Renal/GU negative Renal ROS  negative genitourinary   Musculoskeletal   Abdominal   Peds  Hematology negative hematology ROS (+)   Anesthesia Other Findings See surgeon's H&P   Reproductive/Obstetrics negative OB ROS                           Anesthesia Physical Anesthesia Plan  ASA: III  Anesthesia Plan: General   Post-op Pain Management:    Induction: Intravenous  Airway Management Planned: LMA  Additional Equipment:   Intra-op Plan:   Post-operative Plan: Extubation in OR  Informed Consent: I have reviewed the patients History and Physical, chart, labs and discussed the procedure including the risks, benefits and alternatives for the proposed anesthesia with the patient or authorized representative who has indicated his/her understanding and acceptance.   Dental Advisory Given  Plan Discussed with: CRNA and Surgeon  Anesthesia Plan Comments:         Anesthesia Quick Evaluation

## 2012-04-13 NOTE — Progress Notes (Signed)
Assisted Dr. Frederick with left, ultrasound guided, popliteal/saphenous block. Side rails up, monitors on throughout procedure. See vital signs in flow sheet. Tolerated Procedure well. 

## 2012-04-17 ENCOUNTER — Encounter (HOSPITAL_BASED_OUTPATIENT_CLINIC_OR_DEPARTMENT_OTHER): Payer: Self-pay | Admitting: Orthopedic Surgery

## 2012-12-13 IMAGING — CR DG ANKLE COMPLETE 3+V*L*
3 series · 3 of 3 positions shown · non-contrast
Comparison: None

CLINICAL DATA: Left ankle pain and swelling following injury..

LEFT ANKLE COMPLETE - 3+ VIEW

[t ankle joint ap left]
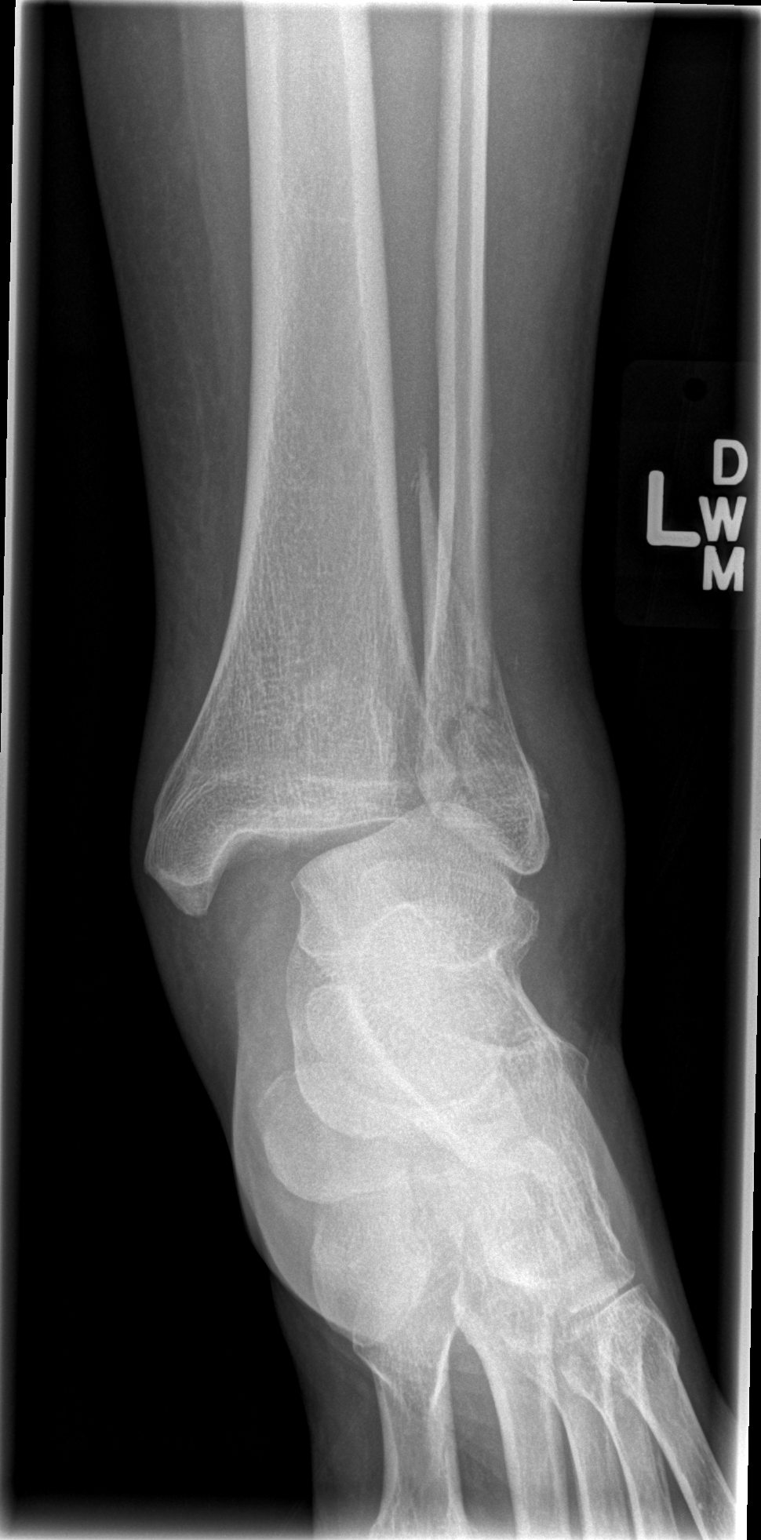

[t ankle joint oblique left]
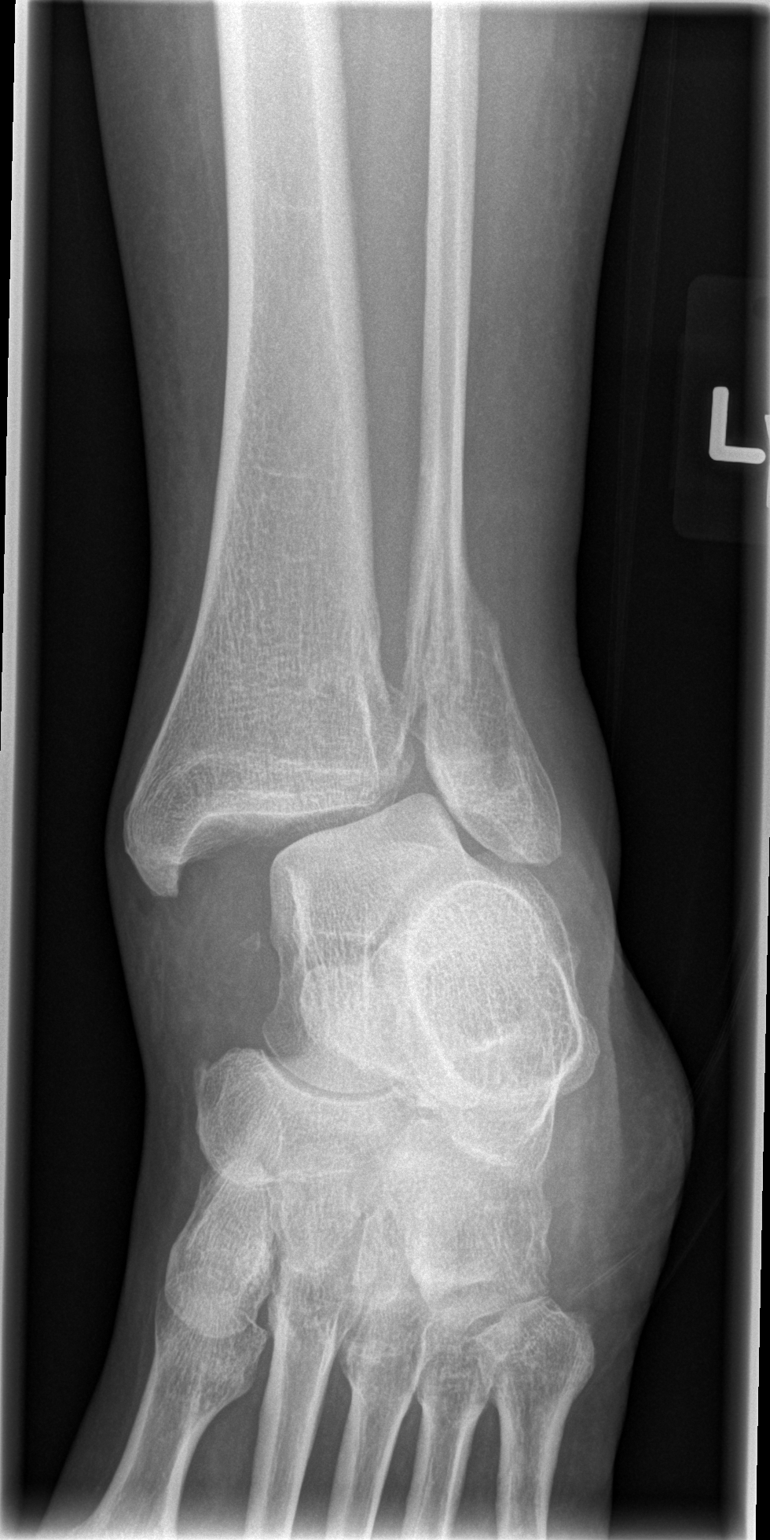

[t ankle joint lat left]
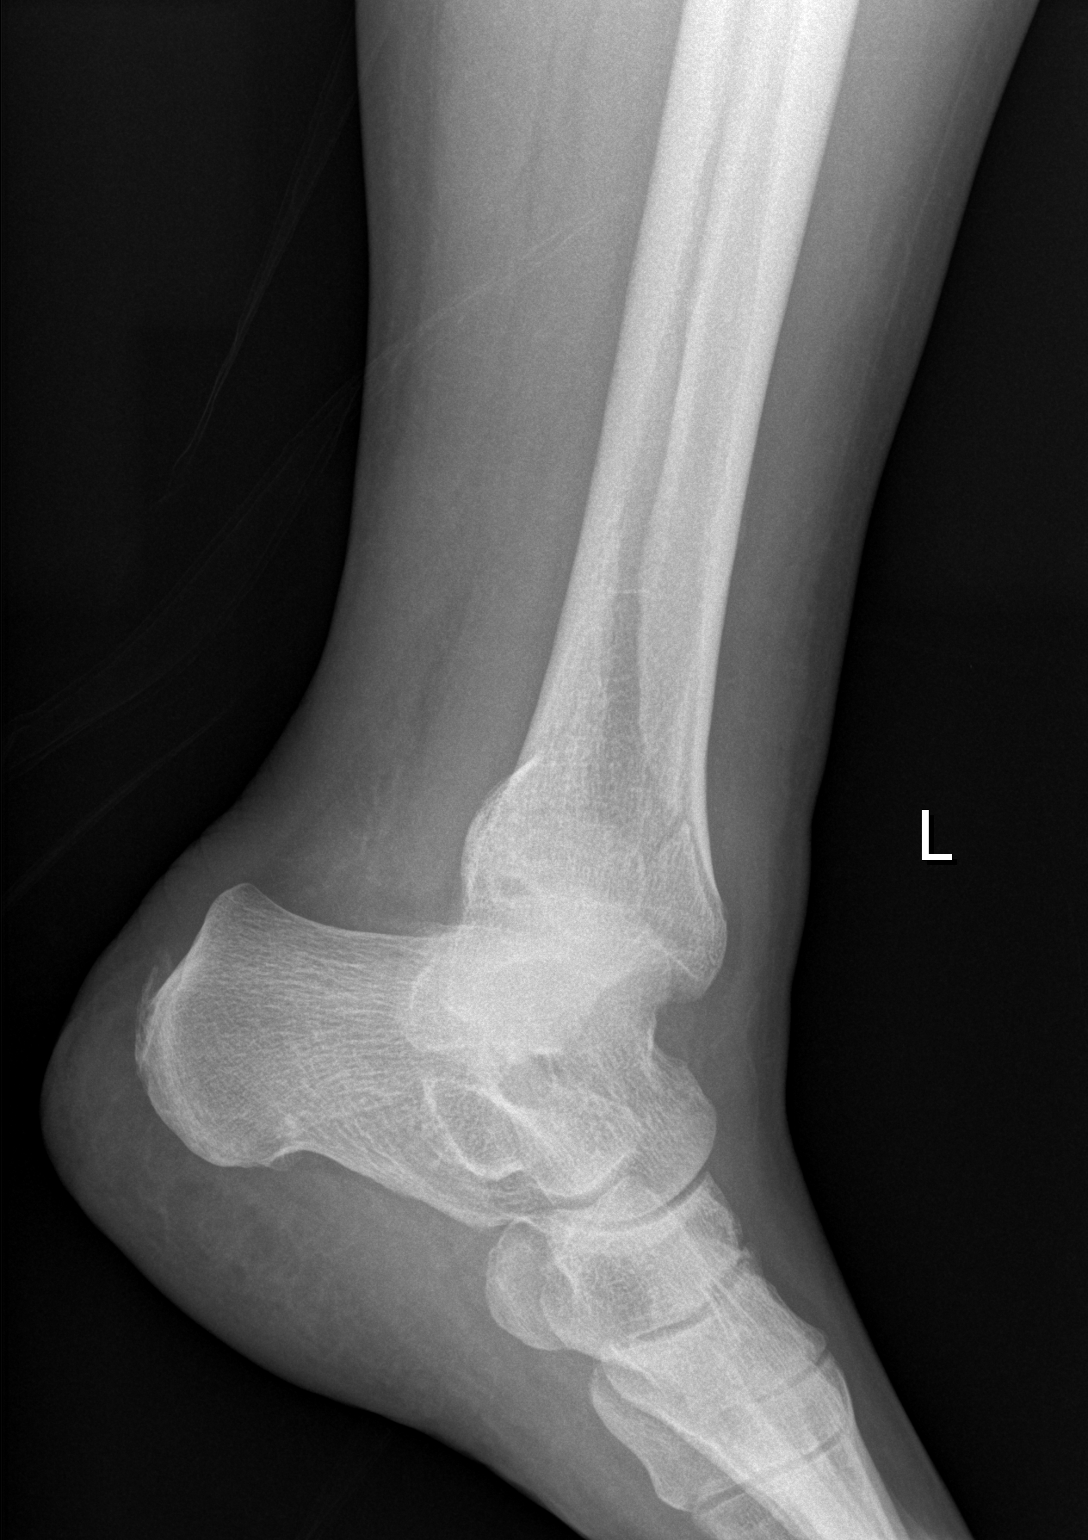

[3 of 3 positions shown; findings below may reference images not displayed]

FINDINGS: There is lateral subluxation/near dislocation at the
tibiotalar joint.
Fractures of the medial malleolus and anterior distal tibial noted.
A mildly comminuted fracture of the distal fibula is present.
Soft tissue swelling is present.
The talar dome appears intact.
IMPRESSION: Fracture subluxation/near dislocation of the left ankle as
described.

## 2013-09-05 ENCOUNTER — Other Ambulatory Visit: Payer: Self-pay | Admitting: Obstetrics and Gynecology

## 2014-06-01 ENCOUNTER — Encounter (HOSPITAL_COMMUNITY): Payer: Self-pay | Admitting: Emergency Medicine

## 2014-06-01 ENCOUNTER — Emergency Department (HOSPITAL_COMMUNITY)
Admission: EM | Admit: 2014-06-01 | Discharge: 2014-06-01 | Disposition: A | Discharge status: Home / Self Care | Payer: BC Managed Care – PPO | Attending: Emergency Medicine | Admitting: Emergency Medicine

## 2014-06-01 DIAGNOSIS — I10 Essential (primary) hypertension: Secondary | ICD-10-CM | POA: Insufficient documentation

## 2014-06-01 DIAGNOSIS — K219 Gastro-esophageal reflux disease without esophagitis: Secondary | ICD-10-CM | POA: Diagnosis not present

## 2014-06-01 DIAGNOSIS — Z79899 Other long term (current) drug therapy: Secondary | ICD-10-CM | POA: Insufficient documentation

## 2014-06-01 DIAGNOSIS — Z7982 Long term (current) use of aspirin: Secondary | ICD-10-CM | POA: Insufficient documentation

## 2014-06-01 DIAGNOSIS — R3589 Other polyuria: Secondary | ICD-10-CM | POA: Diagnosis not present

## 2014-06-01 DIAGNOSIS — E7800 Pure hypercholesterolemia, unspecified: Secondary | ICD-10-CM | POA: Diagnosis not present

## 2014-06-01 DIAGNOSIS — F329 Major depressive disorder, single episode, unspecified: Secondary | ICD-10-CM | POA: Diagnosis not present

## 2014-06-01 DIAGNOSIS — G4730 Sleep apnea, unspecified: Secondary | ICD-10-CM | POA: Diagnosis not present

## 2014-06-01 DIAGNOSIS — F3289 Other specified depressive episodes: Secondary | ICD-10-CM | POA: Diagnosis not present

## 2014-06-01 DIAGNOSIS — T50905A Adverse effect of unspecified drugs, medicaments and biological substances, initial encounter: Secondary | ICD-10-CM

## 2014-06-01 DIAGNOSIS — R51 Headache: Secondary | ICD-10-CM | POA: Insufficient documentation

## 2014-06-01 DIAGNOSIS — E039 Hypothyroidism, unspecified: Secondary | ICD-10-CM | POA: Diagnosis not present

## 2014-06-01 DIAGNOSIS — M129 Arthropathy, unspecified: Secondary | ICD-10-CM | POA: Insufficient documentation

## 2014-06-01 DIAGNOSIS — I158 Other secondary hypertension: Secondary | ICD-10-CM

## 2014-06-01 DIAGNOSIS — R358 Other polyuria: Secondary | ICD-10-CM | POA: Insufficient documentation

## 2014-06-01 LAB — CBC WITH DIFFERENTIAL/PLATELET
Basophils Absolute: 0 10*3/uL (ref 0.0–0.1)
Basophils Relative: 0 % (ref 0–1)
EOS ABS: 0 10*3/uL (ref 0.0–0.7)
Eosinophils Relative: 0 % (ref 0–5)
HCT: 38.6 % (ref 36.0–46.0)
HEMOGLOBIN: 13 g/dL (ref 12.0–15.0)
Lymphocytes Relative: 25 % (ref 12–46)
Lymphs Abs: 2.3 10*3/uL (ref 0.7–4.0)
MCH: 31.6 pg (ref 26.0–34.0)
MCHC: 33.7 g/dL (ref 30.0–36.0)
MCV: 93.7 fL (ref 78.0–100.0)
MONO ABS: 0.6 10*3/uL (ref 0.1–1.0)
Monocytes Relative: 7 % (ref 3–12)
NEUTROS ABS: 6.1 10*3/uL (ref 1.7–7.7)
Neutrophils Relative %: 68 % (ref 43–77)
Platelets: 234 10*3/uL (ref 150–400)
RBC: 4.12 MIL/uL (ref 3.87–5.11)
RDW: 13.2 % (ref 11.5–15.5)
WBC: 8.9 10*3/uL (ref 4.0–10.5)

## 2014-06-01 LAB — COMPREHENSIVE METABOLIC PANEL
ALBUMIN: 4.1 g/dL (ref 3.5–5.2)
ALK PHOS: 55 U/L (ref 39–117)
ALT: 15 U/L (ref 0–35)
AST: 20 U/L (ref 0–37)
Anion gap: 17 — ABNORMAL HIGH (ref 5–15)
BUN: 10 mg/dL (ref 6–23)
CO2: 21 mEq/L (ref 19–32)
Calcium: 9.8 mg/dL (ref 8.4–10.5)
Chloride: 100 mEq/L (ref 96–112)
Creatinine, Ser: 0.6 mg/dL (ref 0.50–1.10)
GFR calc Af Amer: >90 mL/min (ref >90)
GFR calc non Af Amer: >90 mL/min (ref >90)
Glucose, Bld: 121 mg/dL — ABNORMAL HIGH (ref 70–99)
POTASSIUM: 3.6 meq/L — AB (ref 3.7–5.3)
SODIUM: 138 meq/L (ref 137–147)
TOTAL PROTEIN: 7.8 g/dL (ref 6.0–8.3)
Total Bilirubin: 0.3 mg/dL (ref 0.3–1.2)

## 2014-06-01 LAB — URINALYSIS, ROUTINE W REFLEX MICROSCOPIC
Bilirubin Urine: NEGATIVE
Glucose, UA: NEGATIVE mg/dL
Hgb urine dipstick: NEGATIVE
Ketones, ur: NEGATIVE mg/dL
Leukocytes, UA: NEGATIVE
NITRITE: NEGATIVE
PH: 6.5 (ref 5.0–8.0)
Protein, ur: NEGATIVE mg/dL
SPECIFIC GRAVITY, URINE: 1.006 (ref 1.005–1.030)
Urobilinogen, UA: 0.2 mg/dL (ref 0.0–1.0)

## 2014-06-01 NOTE — ED Notes (Signed)
She states that due to the expense of her Benicar, her physician (Dr. Minna Antis) has been transitioning her to Valsartan HCTZ.  She has noted h/a plus recalcitrant hypertension since yesterday.  She states her b/p is persistently high in spite of the 40mg  Benicar her physician advised her to take at about noon today.  She is alert and oriented x 4 with clear speech.

## 2014-06-01 NOTE — Discharge Instructions (Signed)
Keep regularly Scheduled appointment with Dr. Minna Antis on Monday. If you feel worse, symptoms return, you experience HA, vision changes, chest pain or difficulty breathing, please return to ED for further evaluation.

## 2014-06-01 NOTE — ED Provider Notes (Signed)
CSN: [id number]     Arrival date & time 06/01/14  1512 History   None    No chief complaint on file.    (Consider location/radiation/quality/duration/timing/severity/associated sxs/prior Treatment) HPI  Past Medical History  Diagnosis Date  . Hypertension   . Hypercholesteremia   . Hypothyroidism   . Sleep apnea     mild-modreate-tried cpap  . GERD (gastroesophageal reflux disease)   . Depression   . Peripheral vascular disease   . Arthritis    Past Surgical History  Procedure Laterality Date  . Tonsillectomy    . Dilation and curettage of uterus    . Carpal tunnel release      right  . Colonoscopy    . Orif ankle fracture  04/13/2012    Procedure: OPEN REDUCTION INTERNAL FIXATION (ORIF) ANKLE FRACTURE;  Surgeon: Wylene Simmer, MD;  Location: [facility];  Service: Orthopedics;  Laterality: Left;   No family history on file. History  Substance Use Topics  . Smoking status: Never Smoker   . Smokeless tobacco: Never Used  . Alcohol Use: Yes     Comment: ocassionally   OB History   Grav Para Term Preterm Abortions TAB SAB Ect Mult Living   2 1 1  1  1   1      Review of Systems    Allergies  Ciprofloxacin and Naproxen sodium  Home Medications   Prior to Admission medications   Medication Sig Start Date End Date Taking? Authorizing Provider  amLODipine (NORVASC) 5 MG tablet Take 5 mg by mouth daily.      Historical Provider, MD  aspirin EC 81 MG tablet Take 4 tablets (325 mg total) by mouth 2 (two) times daily. 04/13/12   Wylene Simmer, MD  benazepril (LOTENSIN) 40 MG tablet Take 40 mg by mouth daily.      Historical Provider, MD  estradiol (ESTRACE) 1 MG tablet Take 1 mg by mouth daily.      Historical Provider, MD  FLUoxetine (PROZAC) 20 MG capsule Take 20 mg by mouth daily.      Historical Provider, MD  levothyroxine (SYNTHROID, LEVOTHROID) 200 MCG tablet Take 100 mcg by mouth daily.      Historical Provider, MD  medroxyPROGESTERone (PROVERA) 5 MG  tablet Take 5 mg by mouth daily.      Historical Provider, MD  metoprolol (LOPRESSOR) 100 MG tablet Take 100 mg by mouth daily.      Historical Provider, MD  omeprazole (PRILOSEC OTC) 20 MG tablet Take 20 mg by mouth daily.      Historical Provider, MD  pravastatin (PRAVACHOL) 80 MG tablet Take 80 mg by mouth daily.      Historical Provider, MD   BP 206/101  Pulse 118  Temp(Src) 98.5 F (36.9 C) (Oral)  Resp 16  SpO2 97% Physical Exam  ED Course  Procedures (including critical care time) Labs Review Labs Reviewed - No data to display  Imaging Review No results found.   EKG Interpretation   Date/Time:  Saturday June 01 2014 15:21:25 EDT Ventricular Rate:  116 PR Interval:  148 QRS Duration: 80 QT Interval:  394 QTC Calculation: 547 R Axis:   54 Text Interpretation:  Sinus tachycardia Probable LVH with secondary repol  abnrm Prolonged QT interval Baseline wander in lead(s) I II aVR SINCE LAST  TRACING HEART RATE HAS INCREASED Confirmed by Winfred Leeds  MD, Elih Mooney [phone])  on 06/01/2014 3:25:57 PM      MDM   Final diagnoses:  None    Please delete . I did not see this patient primarily.   Orlie Dakin, MD 06/02/14 [phone]

## 2014-06-01 NOTE — ED Provider Notes (Signed)
CSN: [id number]     Arrival date & time 06/01/14  1512 History   First MD Initiated Contact with Patient 06/01/14 1543     Chief Complaint  Patient presents with  . Hypertension     (Consider location/radiation/quality/duration/timing/severity/associated sxs/prior Treatment) HPI [name] is a 63 y.o. female with no significant PMH who complains of high blood pressure. She says at her last cardiology appointment on Tuesday, Dr. Minna Antis switched her from [facility] to Valsartan HCTZ (due to cost). She also takes Metoprolol once at night. She began to feel like the Valsartan was not working and did not take anymore after her Thursday evening dose. She did not take any BP medications on Friday. She called her doctor at 12:10pm today and he told her to take her Benecar 40mg  as previously scheduled. She had still not taken her metoprolol she says because she forgot. Upon arrival to the ED her BP is 206/101 and she complains of a HA, but no fever, cp, vision changes, confusion, weakness, SOB, swelling. She also says she has peed a lot today, but also admits to drinking much more today also. She does have a f/u appt with her cardiologist on Monday.  Past Medical History  Diagnosis Date  . Hypertension   . Hypercholesteremia   . Hypothyroidism   . Sleep apnea     mild-modreate-tried cpap  . GERD (gastroesophageal reflux disease)   . Depression   . Peripheral vascular disease   . Arthritis    Past Surgical History  Procedure Laterality Date  . Tonsillectomy    . Dilation and curettage of uterus    . Carpal tunnel release      right  . Colonoscopy    . Orif ankle fracture  04/13/2012    Procedure: OPEN REDUCTION INTERNAL FIXATION (ORIF) ANKLE FRACTURE;  Surgeon: Wylene Simmer, MD;  Location: [place];  Service: Orthopedics;  Laterality: Left;   No family history on file. History  Substance Use Topics  . Smoking status: Never Smoker   . Smokeless tobacco: Never Used  .  Alcohol Use: Yes     Comment: ocassionally   OB History   Grav Para Term Preterm Abortions TAB SAB Ect Mult Living   2 1 1  1  1   1      Review of Systems  Constitutional: Negative for fever and diaphoresis.  HENT: Negative for sore throat.   Eyes: Negative for visual disturbance.  Respiratory: Negative for shortness of breath.   Cardiovascular: Negative for chest pain and leg swelling.  Endocrine: Positive for polyuria.  Genitourinary: Negative for dysuria.  Skin: Negative for rash.  Neurological: Positive for headaches.      Allergies  Ciprofloxacin and Naproxen sodium  Home Medications   Prior to Admission medications   Medication Sig Start Date End Date Taking? Authorizing Provider  acetaminophen (TYLENOL) 500 MG tablet Take 1,000 mg by mouth every 6 (six) hours as needed for mild pain or headache.   Yes Historical Provider, MD  amLODipine (NORVASC) 5 MG tablet Take 5 mg by mouth daily.     Yes Historical Provider, MD  aspirin EC 81 MG tablet Take 162 mg by mouth daily.   Yes Historical Provider, MD  Chromium Picolinate 500 MCG CAPS Take 1 capsule by mouth daily.   Yes Historical Provider, MD  estradiol (ESTRACE) 0.5 MG tablet Take 0.5 mg by mouth daily.   Yes Historical Provider, MD  FLUoxetine (PROZAC) 40 MG capsule Take 40  mg by mouth daily.   Yes Historical Provider, MD  levothyroxine (SYNTHROID, LEVOTHROID) 100 MCG tablet Take 100 mcg by mouth every evening.   Yes Historical Provider, MD  medroxyPROGESTERone (PROVERA) 5 MG tablet Take 5 mg by mouth daily.     Yes Historical Provider, MD  metoprolol (LOPRESSOR) 100 MG tablet Take 100 mg by mouth daily.     Yes Historical Provider, MD  olmesartan (BENICAR) 40 MG tablet Take 40 mg by mouth daily.   Yes Historical Provider, MD  omeprazole (PRILOSEC OTC) 20 MG tablet Take 40 mg by mouth daily.    Yes Historical Provider, MD  pravastatin (PRAVACHOL) 80 MG tablet Take 80 mg by mouth daily.     Yes Historical Provider, MD    BP 137/62  Pulse 66  Temp(Src) 98.3 F (36.8 C) (Oral)  Resp 15  SpO2 95% Physical Exam  Nursing note and vitals reviewed. Constitutional: She is oriented to person, place, and time. She appears well-developed and well-nourished. No distress.  HENT:  Head: Normocephalic and atraumatic.  Mouth/Throat: Oropharynx is clear and moist.  Eyes: Conjunctivae are normal. Pupils are equal, round, and reactive to light. Right eye exhibits no discharge. Left eye exhibits no discharge. No scleral icterus.  Neck: Neck supple.  Cardiovascular: Normal rate, regular rhythm and normal heart sounds.   Pulmonary/Chest: Effort normal and breath sounds normal. No respiratory distress. She has no wheezes. She has no rales.  Abdominal: Soft. There is no tenderness.  Musculoskeletal: She exhibits no tenderness.  Neurological: She is alert and oriented to person, place, and time.  Cranial Nerves II-XII grossly intact  Skin: Skin is warm and dry. No rash noted. She is not diaphoretic.  Psychiatric: She has a normal mood and affect.    ED Course  Procedures (including critical care time) Labs Review Labs Reviewed  COMPREHENSIVE METABOLIC PANEL - Abnormal; Notable for the following:    Potassium 3.6 (*)    Glucose, Bld 121 (*)    Anion gap 17 (*)    All other components within normal limits  URINALYSIS, ROUTINE W REFLEX MICROSCOPIC  CBC WITH DIFFERENTIAL    Imaging Review No results found.   EKG Interpretation   Date/Time:  Saturday June 01 2014 15:21:25 EDT Ventricular Rate:  116 PR Interval:  148 QRS Duration: 80 QT Interval:  394 QTC Calculation: 547 R Axis:   54 Text Interpretation:  Sinus tachycardia Probable LVH with secondary repol  abnrm Prolonged QT interval Baseline wander in lead(s) I II aVR SINCE LAST  TRACING HEART RATE HAS INCREASED Confirmed by JACUBOWITZ  MD, SAM ([id number])  on 06/01/2014 3:25:57 PM        MDM  BP 206/101 HR 118 on arrival, decrease to 159/82  HR 106  without intervention. Advised to take her regular dose of Metoprolol. BP now 137/62, HR 66 Pt now resting comfortably in ED and HA has resolved. Advised f/u with Cardiology on Monday for reg scheduled appt  DC with Return precautions discussed, pt very receptive to this plan.  Final diagnoses:  High blood pressure due to drug   Filed Vitals:   06/01/14 1759  BP: 137/62  Pulse: 66  Temp: 98.3 F (36.8 C)  Resp: 15    Meds given in ED:  Medications - No data to display  Discharge Medication List as of 06/01/2014  6:48 PM     Prior to patient discharge, I discussed and reviewed this case with Dr.Gentry  Verl Dicker, PA-C 06/02/14 [phone]

## 2014-06-02 NOTE — ED Provider Notes (Signed)
Medical screening examination/treatment/procedure(s) were performed by non-physician practitioner and as supervising physician I was immediately available for consultation/collaboration.   EKG Interpretation   Date/Time:  Saturday June 01 2014 15:21:25 EDT Ventricular Rate:  116 PR Interval:  148 QRS Duration: 80 QT Interval:  394 QTC Calculation: 547 R Axis:   54 Text Interpretation:  Sinus tachycardia Probable LVH with secondary repol  abnrm Prolonged QT interval Baseline wander in lead(s) I II aVR SINCE LAST  TRACING HEART RATE HAS INCREASED Confirmed by Winfred Leeds  MD, SAM [phone])  on 06/01/2014 3:25:57 PM        Debby Freiberg, MD 06/02/14 1446

## 2014-08-19 ENCOUNTER — Encounter (HOSPITAL_COMMUNITY): Payer: Self-pay | Admitting: Emergency Medicine

## 2016-04-09 ENCOUNTER — Encounter: Payer: Self-pay | Admitting: Internal Medicine

## 2016-07-02 ENCOUNTER — Other Ambulatory Visit: Payer: Self-pay | Admitting: Obstetrics and Gynecology

## 2016-07-05 LAB — CYTOLOGY - PAP

## 2017-08-29 ENCOUNTER — Encounter (HOSPITAL_COMMUNITY): Payer: Self-pay

## 2017-11-25 DIAGNOSIS — Z1231 Encounter for screening mammogram for malignant neoplasm of breast: Secondary | ICD-10-CM | POA: Diagnosis not present

## 2017-11-25 DIAGNOSIS — Z6838 Body mass index (BMI) 38.0-38.9, adult: Secondary | ICD-10-CM | POA: Diagnosis not present

## 2017-11-25 DIAGNOSIS — Z01419 Encounter for gynecological examination (general) (routine) without abnormal findings: Secondary | ICD-10-CM | POA: Diagnosis not present

## 2017-12-06 DIAGNOSIS — R739 Hyperglycemia, unspecified: Secondary | ICD-10-CM | POA: Diagnosis not present

## 2017-12-06 DIAGNOSIS — I10 Essential (primary) hypertension: Secondary | ICD-10-CM | POA: Diagnosis not present

## 2017-12-06 DIAGNOSIS — E039 Hypothyroidism, unspecified: Secondary | ICD-10-CM | POA: Diagnosis not present

## 2017-12-28 DIAGNOSIS — R7303 Prediabetes: Secondary | ICD-10-CM | POA: Diagnosis not present

## 2017-12-28 DIAGNOSIS — Z0000 Encounter for general adult medical examination without abnormal findings: Secondary | ICD-10-CM | POA: Diagnosis not present

## 2017-12-28 DIAGNOSIS — E7800 Pure hypercholesterolemia, unspecified: Secondary | ICD-10-CM | POA: Diagnosis not present

## 2017-12-28 DIAGNOSIS — E039 Hypothyroidism, unspecified: Secondary | ICD-10-CM | POA: Diagnosis not present

## 2017-12-28 DIAGNOSIS — K219 Gastro-esophageal reflux disease without esophagitis: Secondary | ICD-10-CM | POA: Diagnosis not present

## 2017-12-28 DIAGNOSIS — I10 Essential (primary) hypertension: Secondary | ICD-10-CM | POA: Diagnosis not present

## 2018-01-31 ENCOUNTER — Encounter: Payer: Self-pay | Admitting: Internal Medicine

## 2019-01-01 DIAGNOSIS — Z0000 Encounter for general adult medical examination without abnormal findings: Secondary | ICD-10-CM | POA: Diagnosis not present

## 2019-01-01 DIAGNOSIS — I10 Essential (primary) hypertension: Secondary | ICD-10-CM | POA: Diagnosis not present

## 2019-01-01 DIAGNOSIS — E039 Hypothyroidism, unspecified: Secondary | ICD-10-CM | POA: Diagnosis not present

## 2019-01-01 DIAGNOSIS — E7800 Pure hypercholesterolemia, unspecified: Secondary | ICD-10-CM | POA: Diagnosis not present

## 2019-01-05 DIAGNOSIS — E039 Hypothyroidism, unspecified: Secondary | ICD-10-CM | POA: Diagnosis not present

## 2019-01-05 DIAGNOSIS — N390 Urinary tract infection, site not specified: Secondary | ICD-10-CM | POA: Diagnosis not present

## 2019-01-05 DIAGNOSIS — F418 Other specified anxiety disorders: Secondary | ICD-10-CM | POA: Diagnosis not present

## 2019-01-05 DIAGNOSIS — R7303 Prediabetes: Secondary | ICD-10-CM | POA: Diagnosis not present

## 2019-01-05 DIAGNOSIS — I10 Essential (primary) hypertension: Secondary | ICD-10-CM | POA: Diagnosis not present

## 2019-01-05 DIAGNOSIS — Z0000 Encounter for general adult medical examination without abnormal findings: Secondary | ICD-10-CM | POA: Diagnosis not present

## 2019-01-05 DIAGNOSIS — E7800 Pure hypercholesterolemia, unspecified: Secondary | ICD-10-CM | POA: Diagnosis not present

## 2019-04-12 DIAGNOSIS — H1132 Conjunctival hemorrhage, left eye: Secondary | ICD-10-CM | POA: Diagnosis not present

## 2019-05-18 ENCOUNTER — Other Ambulatory Visit: Payer: Self-pay

## 2019-07-11 DIAGNOSIS — E7800 Pure hypercholesterolemia, unspecified: Secondary | ICD-10-CM | POA: Diagnosis not present

## 2019-07-11 DIAGNOSIS — R7303 Prediabetes: Secondary | ICD-10-CM | POA: Diagnosis not present

## 2019-07-16 DIAGNOSIS — R7303 Prediabetes: Secondary | ICD-10-CM | POA: Diagnosis not present

## 2019-07-16 DIAGNOSIS — K219 Gastro-esophageal reflux disease without esophagitis: Secondary | ICD-10-CM | POA: Diagnosis not present

## 2019-07-16 DIAGNOSIS — E7800 Pure hypercholesterolemia, unspecified: Secondary | ICD-10-CM | POA: Diagnosis not present

## 2019-07-16 DIAGNOSIS — R441 Visual hallucinations: Secondary | ICD-10-CM | POA: Diagnosis not present

## 2019-07-16 DIAGNOSIS — E039 Hypothyroidism, unspecified: Secondary | ICD-10-CM | POA: Diagnosis not present

## 2019-07-16 DIAGNOSIS — F418 Other specified anxiety disorders: Secondary | ICD-10-CM | POA: Diagnosis not present

## 2019-07-16 DIAGNOSIS — I10 Essential (primary) hypertension: Secondary | ICD-10-CM | POA: Diagnosis not present

## 2019-08-19 DIAGNOSIS — R7303 Prediabetes: Secondary | ICD-10-CM

## 2019-08-19 HISTORY — DX: Prediabetes: R73.03

## 2020-01-07 DIAGNOSIS — R739 Hyperglycemia, unspecified: Secondary | ICD-10-CM | POA: Diagnosis not present

## 2020-01-07 DIAGNOSIS — E039 Hypothyroidism, unspecified: Secondary | ICD-10-CM | POA: Diagnosis not present

## 2020-01-07 DIAGNOSIS — E7800 Pure hypercholesterolemia, unspecified: Secondary | ICD-10-CM | POA: Diagnosis not present

## 2020-01-07 DIAGNOSIS — R7303 Prediabetes: Secondary | ICD-10-CM | POA: Diagnosis not present

## 2020-01-07 DIAGNOSIS — I10 Essential (primary) hypertension: Secondary | ICD-10-CM | POA: Diagnosis not present

## 2020-01-11 DIAGNOSIS — K219 Gastro-esophageal reflux disease without esophagitis: Secondary | ICD-10-CM | POA: Diagnosis not present

## 2020-01-11 DIAGNOSIS — Z0000 Encounter for general adult medical examination without abnormal findings: Secondary | ICD-10-CM | POA: Diagnosis not present

## 2020-01-11 DIAGNOSIS — I10 Essential (primary) hypertension: Secondary | ICD-10-CM | POA: Diagnosis not present

## 2020-01-11 DIAGNOSIS — R9431 Abnormal electrocardiogram [ECG] [EKG]: Secondary | ICD-10-CM | POA: Diagnosis not present

## 2020-01-11 DIAGNOSIS — E7800 Pure hypercholesterolemia, unspecified: Secondary | ICD-10-CM | POA: Diagnosis not present

## 2020-01-11 DIAGNOSIS — R7303 Prediabetes: Secondary | ICD-10-CM | POA: Diagnosis not present

## 2020-01-11 DIAGNOSIS — E039 Hypothyroidism, unspecified: Secondary | ICD-10-CM | POA: Diagnosis not present

## 2020-01-28 DIAGNOSIS — H25041 Posterior subcapsular polar age-related cataract, right eye: Secondary | ICD-10-CM | POA: Diagnosis not present

## 2020-01-28 DIAGNOSIS — H43811 Vitreous degeneration, right eye: Secondary | ICD-10-CM | POA: Diagnosis not present

## 2020-01-28 DIAGNOSIS — H5319 Other subjective visual disturbances: Secondary | ICD-10-CM | POA: Diagnosis not present

## 2020-01-28 DIAGNOSIS — H2513 Age-related nuclear cataract, bilateral: Secondary | ICD-10-CM | POA: Diagnosis not present

## 2020-02-04 ENCOUNTER — Encounter: Payer: Self-pay | Admitting: Cardiology

## 2020-02-04 ENCOUNTER — Other Ambulatory Visit: Payer: Self-pay

## 2020-02-04 ENCOUNTER — Ambulatory Visit: Payer: PPO | Admitting: Cardiology

## 2020-02-04 VITALS — BP 144/72 | HR 68 | Temp 97.3°F | Resp 15 | Ht 68.0 in | Wt 256.0 lb

## 2020-02-04 DIAGNOSIS — E039 Hypothyroidism, unspecified: Secondary | ICD-10-CM | POA: Diagnosis not present

## 2020-02-04 DIAGNOSIS — E782 Mixed hyperlipidemia: Secondary | ICD-10-CM | POA: Diagnosis not present

## 2020-02-04 DIAGNOSIS — Z6838 Body mass index (BMI) 38.0-38.9, adult: Secondary | ICD-10-CM | POA: Diagnosis not present

## 2020-02-04 DIAGNOSIS — R9431 Abnormal electrocardiogram [ECG] [EKG]: Secondary | ICD-10-CM

## 2020-02-04 DIAGNOSIS — E119 Type 2 diabetes mellitus without complications: Secondary | ICD-10-CM | POA: Diagnosis not present

## 2020-02-04 DIAGNOSIS — I10 Essential (primary) hypertension: Secondary | ICD-10-CM

## 2020-02-04 MED ORDER — EZETIMIBE 10 MG PO TABS: 10.0000 mg | ORAL_TABLET | Freq: Every day | ORAL | 3 refills | Status: DC

## 2020-02-04 NOTE — Patient Instructions (Signed)
Please remember to bring in your medication bottles in at the next visit.   New Medications that were added at today's visit:  None  Medications that were discontinued at today's visit: None  Office will call you to have the following tests scheduled:  Echo and Stress Test   Recommend follow up with your PCP as scheduled.

## 2020-02-04 NOTE — Progress Notes (Signed)
REASON FOR CONSULT: Abnormal EKG with multiple cardiovascular risk factors  Chief Complaint  Patient presents with  . New Patient (Initial Visit)  . Abnormal ECG    REQUESTING PHYSICIAN:  Deland Pretty, MD [address] [place],  [place] [id number]  Primary Cardiologist: Rex Kras, DO (established care 02/04/2020).   HPI  [name] is a 69 y.o. female who presents to the office with a chief complaint of " abnormal EKG." Patient's past medical history and cardiac risk factors include: Hypertension, hyperlipidemia, hypothyroidism, GERD, postmenopausal female, advanced age, non-insulin-dependent diabetes mellitus type 2.  Patient is referred to the office at the request of her primary care provider for evaluation of an abnormal EKG and worsening cardiovascular risk factors.  Patient states that she recently went to her primary care physician for a physical and had an EKG.  The EKG was interpreted to be abnormal and compared to her prior laboratory values her A1c and lipid panel was trending upward and therefore was referred to cardiology for further evaluation.  Patient is currently asymptomatic in regards to chest pain or shortness of breath at rest or with effort related activities.  Patient states that she has a history of an abnormal EKG for a long time now.  Denies prior history of coronary artery disease, myocardial infarction, congestive heart failure, deep venous thrombosis, pulmonary embolism, stroke, transient ischemic attack.  FUNCTIONAL STATUS: She tries to walk at least 20 minutes a day as weather permitting.  ALLERGIES: Allergies  Allergen Reactions  . Benazepril Cough  . Ciprofloxacin Itching  . Naproxen Sodium Other (See Comments)    Stomach burns      MEDICATION LIST PRIOR TO VISIT: Current Outpatient Medications on File Prior to Visit  Medication Sig Dispense Refill  . acetaminophen (TYLENOL) 500 MG tablet Take 1,000 mg by mouth every 6  (six) hours as needed for mild pain or headache.    . albuterol (VENTOLIN HFA) 108 (90 Base) MCG/ACT inhaler Inhale 2 puffs into the lungs every 6 (six) hours as needed for wheezing or shortness of breath.    Marland Kitchen amLODipine (NORVASC) 10 MG tablet Take 10 mg by mouth daily.     Marland Kitchen aspirin EC 81 MG tablet Take 162 mg by mouth daily.    Marland Kitchen escitalopram (LEXAPRO) 20 MG tablet Take 20 mg by mouth daily.    Marland Kitchen estradiol (ESTRACE) 0.5 MG tablet Take 1 mg by mouth daily.     . irbesartan (AVAPRO) 300 MG tablet Take 300 mg by mouth daily.    Marland Kitchen levothyroxine (SYNTHROID, LEVOTHROID) 100 MCG tablet Take 100 mcg by mouth every evening.    . metoprolol tartrate (LOPRESSOR) 50 MG tablet Take 50 mg by mouth daily.     Marland Kitchen omeprazole (PRILOSEC OTC) 20 MG tablet Take 40 mg by mouth daily.     . pravastatin (PRAVACHOL) 80 MG tablet Take 80 mg by mouth daily.      . Chromium Picolinate 500 MCG CAPS Take 1 capsule by mouth daily.    Marland Kitchen FLUoxetine (PROZAC) 40 MG capsule Take 40 mg by mouth daily.    . medroxyPROGESTERone (PROVERA) 5 MG tablet Take 5 mg by mouth daily.       No current facility-administered medications on file prior to visit.    PAST MEDICAL HISTORY: Past Medical History:  Diagnosis Date  . Arthritis   . Depression   . GERD (gastroesophageal reflux disease)   . Hypercholesteremia   . Hypertension   .  Hypothyroidism   . Pre-diabetes 08/19/2019  . Sleep apnea    mild-modreate-tried cpap    PAST SURGICAL HISTORY: Past Surgical History:  Procedure Laterality Date  . CARPAL TUNNEL RELEASE     right  . COLONOSCOPY    . DILATION AND CURETTAGE OF UTERUS    . ORIF ANKLE FRACTURE  04/13/2012   Procedure: OPEN REDUCTION INTERNAL FIXATION (ORIF) ANKLE FRACTURE;  Surgeon: Wylene Simmer, MD;  Location: [place] [facility];  Service: Orthopedics;  Laterality: Left;  . TONSILLECTOMY      FAMILY HISTORY: The patient family history includes Breast cancer in her sister; Colon cancer in her father;  Dementia in her mother; Heart attack in her father; Hypertension in her sister, sister, sister, and sister.   SOCIAL HISTORY:  The patient  reports that she has never smoked. She has never used smokeless tobacco. She reports current alcohol use. She reports that she does not use drugs.  REVIEW OF SYSTEMS: Review of Systems  Constitution: Negative for chills and fever.  HENT: Negative for ear discharge, ear pain and nosebleeds.   Eyes: Negative for blurred vision and discharge.  Cardiovascular: Negative for chest pain, claudication, dyspnea on exertion, leg swelling, near-syncope, orthopnea, palpitations, paroxysmal nocturnal dyspnea and syncope.  Respiratory: Negative for cough and shortness of breath.   Endocrine: Negative for polydipsia, polyphagia and polyuria.  Hematologic/Lymphatic: Negative for bleeding problem.  Skin: Negative for flushing and nail changes.  Musculoskeletal: Negative for muscle cramps, muscle weakness and myalgias.  Gastrointestinal: Negative for abdominal pain, dysphagia, hematemesis, hematochezia, melena, nausea and vomiting.  Neurological: Negative for dizziness, focal weakness and light-headedness.   PHYSICAL EXAM: Vitals with BMI 02/04/2020 06/01/2014 06/01/2014  Height 5\' 8"  - -  Weight 256 lbs - -  BMI 0000000 - -  Systolic 123456 0000000 [id number]  Diastolic 72 62 82  Pulse 68 66 -    CONSTITUTIONAL: Well-developed and well-nourished. No acute distress.  SKIN: Skin is warm and dry. No rash noted. No cyanosis. No pallor. No jaundice HEAD: Normocephalic and atraumatic.  EYES: No scleral icterus MOUTH/THROAT: Moist oral membranes.  NECK: No JVD present. No thyromegaly noted. No carotid bruits  LYMPHATIC: No visible cervical adenopathy.  CHEST Normal respiratory effort. No intercostal retractions  LUNGS: Clear to auscultation bilaterally.  No stridor. No wheezes. No rales.  CARDIOVASCULAR: Regular rate and rhythm, positive S1-S2, no murmurs rubs or gallops appreciated.   ABDOMINAL: Obese, soft, nontender, nondistended, positive bowel sounds in all 4 quadrants  No apparent ascites.  EXTREMITIES: No peripheral edema  HEMATOLOGIC: No significant bruising NEUROLOGIC: Oriented to person, place, and time. Nonfocal. Normal muscle tone.  PSYCHIATRIC: Normal mood and affect. Normal behavior. Cooperative  CARDIAC DATABASE: EKG: 02/04/2020: Normal sinus rhythm, 60 bpm, normal axis, LVH per voltage criteria, diffuse ST-T changes most likely secondary to LVH but cannot rule out anterolateral ischemia.  Echocardiogram: None  Stress Testing: 06/29/2013 Lexiscan myocardial perfusion study: Perfusion study demonstrated mild perfusion attenuation suggestive of breast tissue artifact in the anterior wall at rest images which completely normalized and stress images with normal isotope uptake.  No evidence of ischemia or scar.  LVEF by gated SPECT 64% low risk study.  Heart Catheterization: None  LABORATORY DATA: Lipid profile: 07/11/2019: Total cholesterol 203, triglycerides 208, HDL 56, LDL 113 01/07/2020: Total cholesterol 231, triglycerides 303, HDL 55, LDL 123  Hemoglobin A1c: 07/11/2019: 6.6 01/07/2020: 6.6  TSH:  01/01/2019 1.62  Serum creatinine: 01/07/2020 0.98 mg/dL  FINAL MEDICATION LIST END OF ENCOUNTER: Meds  ordered this encounter  Medications  . ezetimibe (ZETIA) 10 MG tablet    Sig: Take 1 tablet (10 mg total) by mouth daily.    Dispense:  90 tablet    Refill:  3    Medications Discontinued During This Encounter  Medication Reason  . olmesartan (BENICAR) 40 MG tablet Duplicate     Current Outpatient Medications:  .  acetaminophen (TYLENOL) 500 MG tablet, Take 1,000 mg by mouth every 6 (six) hours as needed for mild pain or headache., Disp: , Rfl:  .  albuterol (VENTOLIN HFA) 108 (90 Base) MCG/ACT inhaler, Inhale 2 puffs into the lungs every 6 (six) hours as needed for wheezing or shortness of breath., Disp: , Rfl:  .  amLODipine (NORVASC) 10 MG  tablet, Take 10 mg by mouth daily. , Disp: , Rfl:  .  aspirin EC 81 MG tablet, Take 162 mg by mouth daily., Disp: , Rfl:  .  escitalopram (LEXAPRO) 20 MG tablet, Take 20 mg by mouth daily., Disp: , Rfl:  .  estradiol (ESTRACE) 0.5 MG tablet, Take 1 mg by mouth daily. , Disp: , Rfl:  .  irbesartan (AVAPRO) 300 MG tablet, Take 300 mg by mouth daily., Disp: , Rfl:  .  levothyroxine (SYNTHROID, LEVOTHROID) 100 MCG tablet, Take 100 mcg by mouth every evening., Disp: , Rfl:  .  metoprolol tartrate (LOPRESSOR) 50 MG tablet, Take 50 mg by mouth daily. , Disp: , Rfl:  .  omeprazole (PRILOSEC OTC) 20 MG tablet, Take 40 mg by mouth daily. , Disp: , Rfl:  .  pravastatin (PRAVACHOL) 80 MG tablet, Take 80 mg by mouth daily.  , Disp: , Rfl:  .  Chromium Picolinate 500 MCG CAPS, Take 1 capsule by mouth daily., Disp: , Rfl:  .  ezetimibe (ZETIA) 10 MG tablet, Take 1 tablet (10 mg total) by mouth daily., Disp: 90 tablet, Rfl: 3 .  FLUoxetine (PROZAC) 40 MG capsule, Take 40 mg by mouth daily., Disp: , Rfl:  .  medroxyPROGESTERone (PROVERA) 5 MG tablet, Take 5 mg by mouth daily.  , Disp: , Rfl:   IMPRESSION:    ICD-10-CM   1. Abnormal EKG  R94.31 PCV MYOCARDIAL PERFUSION WITH LEXISCAN    PCV ECHOCARDIOGRAM COMPLETE  2. Essential hypertension  I10 EKG 12-Lead  3. Class 2 severe obesity due to excess calories with serious comorbidity and body mass index (BMI) of 38.0 to 38.9 in adult (HCC)  E66.01    Z68.38   4. Mixed hyperlipidemia  E78.2 ezetimibe (ZETIA) 10 MG tablet  5. Type 2 diabetes mellitus without complication, without long-term current use of insulin (HCC)  E11.9   6. Hypothyroidism, unspecified type  E03.9      RECOMMENDATIONS: KRISETTE CLOUTIER is a 69 y.o. female whose past medical history and cardiac risk factors include: Hypertension, hyperlipidemia, hypothyroidism, GERD, postmenopausal female, advanced age, non-insulin-dependent diabetes mellitus type 2.  Abnormal EKG:  Patient's underlying  rhythm was normal sinus but has ST-T changes secondary to repolarization from LVH per voltage criteria but underlying ischemia cannot be ruled out.  Patient has had this finding in the past; however, her current myocardial risk factors continue to worsen I would recommend further cardiac evaluation.  Echocardiogram will be ordered to evaluate for structural heart disease and left ventricular systolic function.  Nuclear stress test recommended to evaluate for reversible ischemia.  Mixed hyperlipidemia:  Currently not at goal.  Continue pravastatin.  We will add Zetia 10 mg p.o. daily  Recommend  checking lipid profile in 6 weeks, will be ordered at the next office visit.  Patient aware.  Non-insulin-dependent diabetes mellitus type 2: Currently managed by primary team.  Obesity, due to excess calories: . Body mass index is 38.92 kg/m. . I reviewed with the patient the importance of diet, regular physical activity/exercise, weight loss.   . Patient is educated on increasing physical activity gradually as tolerated.  With the goal of moderate intensity exercise for 30 minutes a day 5 days a week.  Orders Placed This Encounter  Procedures  . PCV MYOCARDIAL PERFUSION WITH LEXISCAN  . EKG 12-Lead  . PCV ECHOCARDIOGRAM COMPLETE   --Continue cardiac medications as reconciled in final medication list. --Return in about 4 weeks (around 03/03/2020) for re-evaluation of symptoms., review test results.. Or sooner if needed. --Continue follow-up with your primary care physician regarding the management of your other chronic comorbid conditions.  Patient's questions and concerns were addressed to her satisfaction. She voices understanding of the instructions provided during this encounter.   During this visit I reviewed and updated: Tobacco history  allergies medication reconciliation  medical history  surgical history  family history  social history.  This note was created using a voice  recognition software as a result there may be grammatical errors inadvertently enclosed that do not reflect the nature of this encounter. Every attempt is made to correct such errors.  Rex Kras, [place], [facility]  Pager: [phone] Office: [phone]

## 2020-02-18 ENCOUNTER — Ambulatory Visit: Payer: PPO

## 2020-02-18 ENCOUNTER — Other Ambulatory Visit: Payer: Self-pay

## 2020-02-18 DIAGNOSIS — R9431 Abnormal electrocardiogram [ECG] [EKG]: Secondary | ICD-10-CM

## 2020-02-22 ENCOUNTER — Other Ambulatory Visit: Payer: Self-pay

## 2020-02-22 ENCOUNTER — Ambulatory Visit: Payer: PPO

## 2020-02-22 DIAGNOSIS — R9431 Abnormal electrocardiogram [ECG] [EKG]: Secondary | ICD-10-CM

## 2020-02-25 DIAGNOSIS — H25013 Cortical age-related cataract, bilateral: Secondary | ICD-10-CM | POA: Diagnosis not present

## 2020-02-25 DIAGNOSIS — H43811 Vitreous degeneration, right eye: Secondary | ICD-10-CM | POA: Diagnosis not present

## 2020-02-25 DIAGNOSIS — H2512 Age-related nuclear cataract, left eye: Secondary | ICD-10-CM | POA: Diagnosis not present

## 2020-02-25 DIAGNOSIS — H25043 Posterior subcapsular polar age-related cataract, bilateral: Secondary | ICD-10-CM | POA: Diagnosis not present

## 2020-02-25 DIAGNOSIS — H2513 Age-related nuclear cataract, bilateral: Secondary | ICD-10-CM | POA: Diagnosis not present

## 2020-02-28 ENCOUNTER — Ambulatory Visit: Payer: PPO | Admitting: Cardiology

## 2020-02-29 ENCOUNTER — Encounter: Payer: Self-pay | Admitting: Cardiology

## 2020-02-29 ENCOUNTER — Other Ambulatory Visit: Payer: Self-pay | Admitting: Cardiology

## 2020-02-29 ENCOUNTER — Other Ambulatory Visit: Payer: Self-pay

## 2020-02-29 ENCOUNTER — Ambulatory Visit: Payer: PPO | Admitting: Cardiology

## 2020-02-29 ENCOUNTER — Telehealth: Payer: Self-pay

## 2020-02-29 VITALS — BP 132/69 | HR 67 | Temp 97.7°F | Ht 68.0 in | Wt 255.0 lb

## 2020-02-29 DIAGNOSIS — I10 Essential (primary) hypertension: Secondary | ICD-10-CM | POA: Diagnosis not present

## 2020-02-29 DIAGNOSIS — E039 Hypothyroidism, unspecified: Secondary | ICD-10-CM

## 2020-02-29 DIAGNOSIS — R9439 Abnormal result of other cardiovascular function study: Secondary | ICD-10-CM

## 2020-02-29 DIAGNOSIS — E119 Type 2 diabetes mellitus without complications: Secondary | ICD-10-CM | POA: Diagnosis not present

## 2020-02-29 DIAGNOSIS — E782 Mixed hyperlipidemia: Secondary | ICD-10-CM

## 2020-02-29 DIAGNOSIS — Z6838 Body mass index (BMI) 38.0-38.9, adult: Secondary | ICD-10-CM

## 2020-02-29 DIAGNOSIS — I4892 Unspecified atrial flutter: Secondary | ICD-10-CM

## 2020-02-29 DIAGNOSIS — E66812 Obesity, class 2: Secondary | ICD-10-CM

## 2020-02-29 MED ORDER — APIXABAN 5 MG PO TABS: 5.0000 mg | ORAL_TABLET | Freq: Two times a day (BID) | ORAL | 0 refills | Status: DC

## 2020-02-29 NOTE — Telephone Encounter (Signed)
Hi Norma,  Please have her study done in the next 1-2 weeks. Abnormal nuclear stress test. Thanks. ST

## 2020-02-29 NOTE — Progress Notes (Signed)
[name] Date of Birth: [birth year] MRN: [id number] Primary Care Provider:Prevost, [place] Reader, [place] Primary Cardiologist: Rex Kras, DO  (established care 02/04/2020).   Date: 02/29/20 Last Office Visit: 02/04/2020  Chief Complaint  Patient presents with  . Abnormal ECG    test results  . Follow-up    pt to have cataract surgery 0n 6/16 and 7/2    HPI  [name] is a 69 y.o. female who presents to the office with a chief complaint of " review test results." Patient's past medical history and cardiac risk factors include: Hypertension, hyperlipidemia, hypothyroidism, GERD, postmenopausal female, advanced age, non-insulin-dependent diabetes mellitus type 2, newly discovered paroxysmal atrial flutter.  Patient was referred to the office in April 2021 at the request of her primary care provider for evaluation of abnormal EKG and worsening cardiovascular risk factors.  At the last office visit he decided to undergo an echocardiogram and stress test to further risk stratify the patient given her EKG changes.   Echocardiogram results reviewed with the patient in great detail and noted below for further reference.  Patient's nuclear stress test results were reviewed including the images from SPECT at today's office visit.  Patient was informed that she does have a reversible defect on a recent nuclear stress test.  And her resting EKG showed atypical atrial flutter.  She also has intermittent episodes of feeling tired and fatigued.  Her echocardiogram shows severely dilated left atrium. These findings were discussed in great detail with the patient.  Denies prior history of coronary artery disease, myocardial infarction, congestive heart failure, deep venous thrombosis, pulmonary embolism, stroke, transient ischemic attack.  FUNCTIONAL STATUS: She tries to walk at least 20 minutes a day as weather permitting.  ALLERGIES: Allergies  Allergen Reactions  . Benazepril Cough  .  Ciprofloxacin Itching  . Naproxen Sodium Other (See Comments)    Stomach burns    MEDICATION LIST PRIOR TO VISIT: Current Outpatient Medications on File Prior to Visit  Medication Sig Dispense Refill  . acetaminophen (TYLENOL) 500 MG tablet Take 1,000 mg by mouth every 6 (six) hours as needed for mild pain or headache.    Marland Kitchen amLODipine (NORVASC) 10 MG tablet Take 10 mg by mouth daily.     Marland Kitchen aspirin EC 81 MG tablet Take 162 mg by mouth daily.    Marland Kitchen escitalopram (LEXAPRO) 20 MG tablet Take 20 mg by mouth daily.    Marland Kitchen estradiol (ESTRACE) 0.5 MG tablet Take 1 mg by mouth daily.     Marland Kitchen ezetimibe (ZETIA) 10 MG tablet Take 1 tablet (10 mg total) by mouth daily. 90 tablet 3  . irbesartan (AVAPRO) 300 MG tablet Take 300 mg by mouth daily.    Marland Kitchen levothyroxine (SYNTHROID, LEVOTHROID) 100 MCG tablet Take 100 mcg by mouth every evening.    . medroxyPROGESTERone (PROVERA) 5 MG tablet Take 5 mg by mouth daily.    . metoprolol tartrate (LOPRESSOR) 50 MG tablet Take 50 mg by mouth daily.     Marland Kitchen omeprazole (PRILOSEC OTC) 20 MG tablet Take 40 mg by mouth daily.     . pravastatin (PRAVACHOL) 80 MG tablet Take 80 mg by mouth daily.       No current facility-administered medications on file prior to visit.    PAST MEDICAL HISTORY: Past Medical History:  Diagnosis Date  . Arthritis   . Depression   . GERD (gastroesophageal reflux disease)   . Hypercholesteremia   . Hypertension   . Hypothyroidism   .  Pre-diabetes 08/19/2019  . Sleep apnea    mild-modreate-tried cpap    PAST SURGICAL HISTORY: Past Surgical History:  Procedure Laterality Date  . CARPAL TUNNEL RELEASE     right  . COLONOSCOPY    . DILATION AND CURETTAGE OF UTERUS    . ORIF ANKLE FRACTURE  04/13/2012   Procedure: OPEN REDUCTION INTERNAL FIXATION (ORIF) ANKLE FRACTURE;  Surgeon: Wylene Simmer, MD;  Location: [facility];  Service: Orthopedics;  Laterality: Left;  . TONSILLECTOMY      FAMILY HISTORY: The patient family  history includes Breast cancer in her sister; Colon cancer in her father; Dementia in her mother; Heart attack in her father; Hypertension in her sister, sister, sister, and sister.   SOCIAL HISTORY:  The patient  reports that she has never smoked. She has never used smokeless tobacco. She reports current alcohol use. She reports that she does not use drugs.  REVIEW OF SYSTEMS: Review of Systems  Constitution: Positive for malaise/fatigue. Negative for chills and fever.  HENT: Negative for ear discharge, ear pain and nosebleeds.   Eyes: Negative for blurred vision and discharge.  Cardiovascular: Positive for dyspnea on exertion. Negative for chest pain, claudication, leg swelling, near-syncope, orthopnea, palpitations, paroxysmal nocturnal dyspnea and syncope.  Respiratory: Negative for cough and shortness of breath.   Endocrine: Negative for polydipsia, polyphagia and polyuria.  Hematologic/Lymphatic: Negative for bleeding problem.  Skin: Negative for flushing and nail changes.  Musculoskeletal: Negative for muscle cramps, muscle weakness and myalgias.  Gastrointestinal: Negative for abdominal pain, dysphagia, hematemesis, hematochezia, melena, nausea and vomiting.  Neurological: Negative for dizziness, focal weakness and light-headedness.   PHYSICAL EXAM: Vitals with BMI 02/29/2020 02/04/2020 06/01/2014  Height 5\' 8"  5\' 8"  -  Weight 255 lbs 256 lbs -  BMI [id number] 0000000 -  Systolic [id number] 0000000  Diastolic 69 72 62  Pulse 67 68 66    CONSTITUTIONAL: Well-developed and well-nourished. No acute distress.  SKIN: Skin is warm and dry. No rash noted. No cyanosis. No pallor. No jaundice HEAD: Normocephalic and atraumatic.  EYES: No scleral icterus MOUTH/THROAT: Moist oral membranes.  NECK: No JVD present. No thyromegaly noted. No carotid bruits  LYMPHATIC: No visible cervical adenopathy.  CHEST Normal respiratory effort. No intercostal retractions  LUNGS: Clear to auscultation bilaterally.   No stridor. No wheezes. No rales.  CARDIOVASCULAR: Regular rate and rhythm, positive S1-S2, no murmurs rubs or gallops appreciated.   ABDOMINAL: Obese, soft, nontender, nondistended, positive bowel sounds in all 4 quadrants. No apparent ascites.  EXTREMITIES: No peripheral edema  HEMATOLOGIC: No significant bruising NEUROLOGIC: Oriented to person, place, and time. Nonfocal. Normal muscle tone.  PSYCHIATRIC: Normal mood and affect. Normal behavior. Cooperative  CARDIAC DATABASE: EKG: 02/04/2020: Normal sinus rhythm, 60 bpm, normal axis, LVH per voltage criteria, diffuse ST-T changes most likely secondary to LVH but cannot rule out anterolateral ischemia.  Echocardiogram: 02/22/2020: LVEF 59%, moderate concentric LVH, grade 2 diastolic dysfunction, severely dilated left atrium, mild TR.   Stress Testing: Lexiscan (Walking with mod Bruce)Tetrofosmin Stress Test  02/18/2020: Resting EKG/ECG demonstrated atypical atrial flutter with rapid ventricular response. Observed left ventricular hypertrophy with secondary ST-T changes, repolarization abnormality. Peak EKG/ECG revealed no significant ST-T change from baseline abnormality. Patient achieved 101% of MPHR with modified Bruce protocol.  During infusion the recovery ECG revealed occasional premature  There is a reversible mild defect in the distal anterior and apical regions.  Overall LV systolic function is normal with regional wall motion abnormalities in the  same region. Stress LV EF: 55%.  Low risk in view of size of defect and preserved LVEF.   Heart Catheterization: None  LABORATORY DATA: Lipid profile: 07/11/2019: Total cholesterol 203, triglycerides 208, HDL 56, LDL 113 01/07/2020: Total cholesterol 231, triglycerides 303, HDL 55, LDL 123  Hemoglobin A1c: 07/11/2019: 6.6 01/07/2020: 6.6  TSH:  01/01/2019 1.62  Serum creatinine: 01/07/2020 0.98 mg/dL  FINAL MEDICATION LIST END OF ENCOUNTER: Meds ordered this encounter    Medications  . apixaban (ELIQUIS) 5 MG TABS tablet    Sig: Take 1 tablet (5 mg total) by mouth 2 (two) times daily.    Dispense:  60 tablet    Refill:  0     Current Outpatient Medications:  .  acetaminophen (TYLENOL) 500 MG tablet, Take 1,000 mg by mouth every 6 (six) hours as needed for mild pain or headache., Disp: , Rfl:  .  amLODipine (NORVASC) 10 MG tablet, Take 10 mg by mouth daily. , Disp: , Rfl:  .  aspirin EC 81 MG tablet, Take 162 mg by mouth daily., Disp: , Rfl:  .  escitalopram (LEXAPRO) 20 MG tablet, Take 20 mg by mouth daily., Disp: , Rfl:  .  estradiol (ESTRACE) 0.5 MG tablet, Take 1 mg by mouth daily. , Disp: , Rfl:  .  ezetimibe (ZETIA) 10 MG tablet, Take 1 tablet (10 mg total) by mouth daily., Disp: 90 tablet, Rfl: 3 .  irbesartan (AVAPRO) 300 MG tablet, Take 300 mg by mouth daily., Disp: , Rfl:  .  levothyroxine (SYNTHROID, LEVOTHROID) 100 MCG tablet, Take 100 mcg by mouth every evening., Disp: , Rfl:  .  medroxyPROGESTERone (PROVERA) 5 MG tablet, Take 5 mg by mouth daily., Disp: , Rfl:  .  metoprolol tartrate (LOPRESSOR) 50 MG tablet, Take 50 mg by mouth daily. , Disp: , Rfl:  .  omeprazole (PRILOSEC OTC) 20 MG tablet, Take 40 mg by mouth daily. , Disp: , Rfl:  .  pravastatin (PRAVACHOL) 80 MG tablet, Take 80 mg by mouth daily.  , Disp: , Rfl:  .  apixaban (ELIQUIS) 5 MG TABS tablet, Take 1 tablet (5 mg total) by mouth 2 (two) times daily., Disp: 60 tablet, Rfl: 0  IMPRESSION:    ICD-10-CM   1. Abnormal nuclear stress test  R94.39 CT CORONARY MORPH W/CTA COR W/SCORE W/CA W/CM &/OR WO/CM    CT CORONARY FRACTIONAL FLOW RESERVE DATA PREP    CT CORONARY FRACTIONAL FLOW RESERVE FLUID ANALYSIS    Basic Metabolic Panel (BMET)  2. Essential hypertension  I10   3. Class 2 severe obesity due to excess calories with serious comorbidity and body mass index (BMI) of 38.0 to 38.9 in adult (HCC)  E66.01    Z68.38   4. Mixed hyperlipidemia  E78.2   5. Type 2 diabetes mellitus  without complication, without long-term current use of insulin (HCC)  E11.9   6. Hypothyroidism, unspecified type  E03.9   7. Paroxysmal atrial flutter (HCC)  I48.92 CBC    apixaban (ELIQUIS) 5 MG TABS tablet     RECOMMENDATIONS: [name] is a 69 y.o. female whose past medical history and cardiac risk factors include: Hypertension, hyperlipidemia, hypothyroidism, GERD, postmenopausal female, advanced age, non-insulin-dependent diabetes mellitus type 2.  Abnormal nuclear stress test.  There appears to be a reversible perfusion defect in the distribution of the LAD suggestive of underlying ischemia.  However, patient does not have any active chest pain or anginal equivalent.  We discussed medical therapy plus  or minus cardiac CTA with FFR.  Patient would like to proceed with cardiac CTA FFR to evaluate for any obstructive coronary disease.  Continue beta-blocker therapy.  Her ventricular rate is well controlled.  On the day of the procedure she is recommended to take an extra dose of the beta-blocker to help control the ventricular rate during the cardiac CTA.  In the setting of diabetes mellitus, would recommend an LDL level less than or equal to 70 mg/dL.  Patient will follow up with her primary in regards to another repeat lipid profile.  Echocardiographic findings suggestive of possible apical hypertrophic.  She also has grade 2 diastolic dysfunction and suggestive of high left atrial pressure. Will add diuretics at the next visit.   Newly discovered paroxysmal atrial flutter: . I had a long and detailed discussion with the patient regarding the incidence, etiology, pathophysiology, prognosis, and therapeutic options for atrial flutter.  Specifically we discussed oral anticoagulation for stroke prevention. . I spent a considerable amount of time reviewing the concepts of rate control vs rhythm control strategy and risk for stroke.   . CHA2DS2-VASc SCORE is 4 which correlates to 4 %  risk of stroke per year.  . I reviewed with patient the benefits, risks, and alternatives of initiating/continuing anticoagulation, and based on atrial flutter treatment guidelines, I recommended long-term anticoagulation for stroke prevention.  Patient agreed.  Long-term oral anticoagulation:  Indication paroxysmal atrial flutter.  No prior history of intracranial bleeding or internal bleeding.  No recent surgeries.  Check CBC to evaluate baseline hemoglobin  Patient has been educated on the importance of monitoring for evidence of bleeding. Patient educated on fall precautions and if she was injured despite the mechanism of injury she is to seek medical attention at the closest ER since she is on blood thinners.  Patient understands importance of this because if internal bleeding is not treated in a timely manner it may further lead to morbidity and/or mortality.  Patient voices understanding of these recommendations and provides verbal feedback.  Mixed hyperlipidemia:  Currently not at goal.  Continue pravastatin.  Patient was started on Zetia at the last office visit and she has tolerated the medication well without any side effects or intolerances.  Non-insulin-dependent diabetes mellitus type 2: Currently managed by primary team.  Obesity, due to excess calories: Body mass index is 38.77 kg/m. . I reviewed with the patient the importance of diet, regular physical activity/exercise, weight loss.   . Patient is educated on increasing physical activity gradually as tolerated.  With the goal of moderate intensity exercise for 30 minutes a day 5 days a week.  Orders Placed This Encounter  Procedures  . CT CORONARY MORPH W/CTA COR W/SCORE W/CA W/CM &/OR WO/CM  . CT CORONARY FRACTIONAL FLOW RESERVE DATA PREP  . CT CORONARY FRACTIONAL FLOW RESERVE FLUID ANALYSIS  . Basic Metabolic Panel (BMET)  . CBC   --Continue cardiac medications as reconciled in final medication  list. --Return in about 3 weeks (around 03/21/2020). Or sooner if needed. --Continue follow-up with your primary care physician regarding the management of your other chronic comorbid conditions.  Patient's questions and concerns were addressed to her satisfaction. She voices understanding of the instructions provided during this encounter.   This note was created using a voice recognition software as a result there may be grammatical errors inadvertently enclosed that do not reflect the nature of this encounter. Every attempt is made to correct such errors.  Jonie Burdell Terri Skains, DO, [facility]  Pager:  [phone] Office: [phone]

## 2020-03-01 LAB — CBC
Hematocrit: 39.8 % (ref 34.0–46.6)
Hemoglobin: 13.3 g/dL (ref 11.1–15.9)
MCH: 31.4 pg (ref 26.6–33.0)
MCHC: 33.4 g/dL (ref 31.5–35.7)
MCV: 94 fL (ref 79–97)
Platelets: 275 10*3/uL (ref 150–450)
RBC: 4.23 x10E6/uL (ref 3.77–5.28)
RDW: 12.8 % (ref 11.7–15.4)
WBC: 8.6 10*3/uL (ref 3.4–10.8)

## 2020-03-01 LAB — BASIC METABOLIC PANEL
BUN/Creatinine Ratio: 13 (ref 12–28)
BUN: 10 mg/dL (ref 8–27)
CO2: 22 mmol/L (ref 20–29)
Calcium: 9.9 mg/dL (ref 8.7–10.3)
Chloride: 104 mmol/L (ref 96–106)
Creatinine, Ser: 0.77 mg/dL (ref 0.57–1.00)
GFR calc Af Amer: 92 mL/min/{1.73_m2} (ref >59)
GFR calc non Af Amer: 80 mL/min/{1.73_m2} (ref >59)
Glucose: 124 mg/dL — ABNORMAL HIGH (ref 65–99)
Potassium: 4.6 mmol/L (ref 3.5–5.2)
Sodium: 141 mmol/L (ref 134–144)

## 2020-03-06 ENCOUNTER — Telehealth (HOSPITAL_COMMUNITY): Payer: Self-pay | Admitting: *Deleted

## 2020-03-06 NOTE — Telephone Encounter (Signed)
Attempted to call patient regarding upcoming cardiac CT appointment. Left message on voicemail with name and callback number  Kween Bacorn Tai RN Navigator Cardiac Imaging [facility] [phone] Office [phone] Cell 

## 2020-03-06 NOTE — Telephone Encounter (Signed)
Pt returning call in regards to cardiac CT.  Instructions review and pt expressed understanding.

## 2020-03-07 ENCOUNTER — Ambulatory Visit (HOSPITAL_COMMUNITY)
Admission: RE | Admit: 2020-03-07 | Discharge: 2020-03-07 | Disposition: A | Discharge status: Home / Self Care | Payer: PPO | Source: Ambulatory Visit | Attending: Cardiology | Admitting: Cardiology

## 2020-03-07 ENCOUNTER — Other Ambulatory Visit: Payer: Self-pay

## 2020-03-07 DIAGNOSIS — R9439 Abnormal result of other cardiovascular function study: Secondary | ICD-10-CM | POA: Insufficient documentation

## 2020-03-07 DIAGNOSIS — I700 Atherosclerosis of aorta: Secondary | ICD-10-CM | POA: Diagnosis not present

## 2020-03-07 DIAGNOSIS — K449 Diaphragmatic hernia without obstruction or gangrene: Secondary | ICD-10-CM | POA: Diagnosis not present

## 2020-03-07 DIAGNOSIS — I2510 Atherosclerotic heart disease of native coronary artery without angina pectoris: Secondary | ICD-10-CM | POA: Insufficient documentation

## 2020-03-07 MED ORDER — NITROGLYCERIN 0.4 MG SL SUBL
SUBLINGUAL_TABLET | SUBLINGUAL | Status: AC
  Filled 2020-03-07: qty 2

## 2020-03-07 MED ORDER — NITROGLYCERIN 0.4 MG SL SUBL
0.8000 mg | SUBLINGUAL_TABLET | Freq: Once | SUBLINGUAL | Status: AC
  Administered 2020-03-07: 0.8 mg via SUBLINGUAL

## 2020-03-07 MED ORDER — IOHEXOL 350 MG/ML SOLN
80.0000 mL | Freq: Once | INTRAVENOUS | Status: AC | PRN
  Administered 2020-03-07: 80 mL via INTRAVENOUS

## 2020-03-08 ENCOUNTER — Ambulatory Visit (HOSPITAL_COMMUNITY)
Admission: RE | Admit: 2020-03-08 | Discharge: 2020-03-08 | Disposition: A | Discharge status: Home / Self Care | Payer: PPO | Source: Ambulatory Visit | Attending: Cardiology | Admitting: Cardiology

## 2020-03-08 DIAGNOSIS — I2510 Atherosclerotic heart disease of native coronary artery without angina pectoris: Secondary | ICD-10-CM | POA: Diagnosis not present

## 2020-03-08 DIAGNOSIS — R9439 Abnormal result of other cardiovascular function study: Secondary | ICD-10-CM | POA: Insufficient documentation

## 2020-03-10 ENCOUNTER — Ambulatory Visit: Payer: PPO | Admitting: Cardiology

## 2020-03-10 ENCOUNTER — Encounter: Payer: Self-pay | Admitting: Cardiology

## 2020-03-10 ENCOUNTER — Other Ambulatory Visit: Payer: Self-pay

## 2020-03-10 ENCOUNTER — Telehealth: Payer: Self-pay

## 2020-03-10 VITALS — BP 153/72 | HR 75 | Resp 14 | Ht 68.0 in | Wt 255.0 lb

## 2020-03-10 DIAGNOSIS — E782 Mixed hyperlipidemia: Secondary | ICD-10-CM

## 2020-03-10 DIAGNOSIS — I4892 Unspecified atrial flutter: Secondary | ICD-10-CM | POA: Diagnosis not present

## 2020-03-10 DIAGNOSIS — R9439 Abnormal result of other cardiovascular function study: Secondary | ICD-10-CM | POA: Diagnosis not present

## 2020-03-10 DIAGNOSIS — I10 Essential (primary) hypertension: Secondary | ICD-10-CM

## 2020-03-10 DIAGNOSIS — E039 Hypothyroidism, unspecified: Secondary | ICD-10-CM

## 2020-03-10 DIAGNOSIS — E119 Type 2 diabetes mellitus without complications: Secondary | ICD-10-CM

## 2020-03-10 DIAGNOSIS — Z7901 Long term (current) use of anticoagulants: Secondary | ICD-10-CM

## 2020-03-10 DIAGNOSIS — Z6838 Body mass index (BMI) 38.0-38.9, adult: Secondary | ICD-10-CM | POA: Diagnosis not present

## 2020-03-10 MED ORDER — METOPROLOL SUCCINATE ER 50 MG PO TB24: 50.0000 mg | ORAL_TABLET | Freq: Every morning | ORAL | 0 refills | Status: DC

## 2020-03-10 MED ORDER — HYDROCHLOROTHIAZIDE 12.5 MG PO CAPS
12.5000 mg | ORAL_CAPSULE | Freq: Every morning | ORAL | 0 refills | Status: DC
Start: 2020-03-10 — End: 2020-06-09

## 2020-03-10 NOTE — Telephone Encounter (Signed)
LMTCB. URGENT.

## 2020-03-10 NOTE — Progress Notes (Signed)
[name] Date of Birth: September 09, 1951 MRN: [id number] Primary Care Provider:Prevost, [place] Reader, [place] Primary Cardiologist: Rex Kras, DO  (established care 02/04/2020).   Date: 03/10/20 Last Office Visit: 02/29/2020  Chief Complaint  Patient presents with  . Follow-up  . Shortness of Breath  . Results    HPI  [name] is a 69 y.o. female who presents to the office with a chief complaint of " review test results and effort related dyspnea." Patient's past medical history and cardiac risk factors include: Coronary artery disease, hypertension, hyperlipidemia, hypothyroidism, GERD, postmenopausal female, advanced age, non-insulin-dependent diabetes mellitus type 2, newly discovered paroxysmal atrial flutter during stress test.  Patient was referred to the office in April 2021 at the request of her primary care provider for evaluation of abnormal EKG and worsening cardiovascular risk factors.  At the last office visit he decided to undergo an echocardiogram and stress test to further risk stratify the patient given her EKG changes.  Echo nuclear stress test results noted below for further reference.  She was noted to have reversible perfusion defect involving the LAD distribution.  Symptomatically patient was not having any chest pain at rest or with effort related activities but did have effort related dyspnea which could be her anginal equivalent or secondary to uncontrolled hypertension.  Shared decision was to proceed with coronary CTA to evaluate for obstructive CAD.  Patient underwent coronary CTA last Friday and was noted to have disease in the LAD distribution with CT FFR reading less than 0.8.  She was asked to come into the office for further evaluation and reviewing the test results.  Since our last office visit patient states that her symptoms are relatively stable.  She does not have any chest pain at rest or with effort related activities.  Her shortness of breath is  mostly with effort related activities.  She did bring her blood pressure log in for review and her systolic blood pressures are not well controlled.  Started on Eliquis at the last visit given her newly discovered paroxysmal atrial flutter.  She does not endorse any evidence of bleeding.  She states that she had 1 episode of flutter-like sensation lasting for about 30 minutes which resolved after taking 2 baby aspirins.   Denies prior history of myocardial infarction, congestive heart failure, deep venous thrombosis, pulmonary embolism, stroke, transient ischemic attack.  FUNCTIONAL STATUS: She tries to walk at least 20 minutes a day as weather permitting.  ALLERGIES: Allergies  Allergen Reactions  . Benazepril Cough  . Ciprofloxacin Itching  . Naproxen Sodium Other (See Comments)    Stomach burns    MEDICATION LIST PRIOR TO VISIT: Current Outpatient Medications on File Prior to Visit  Medication Sig Dispense Refill  . acetaminophen (TYLENOL) 500 MG tablet Take 1,000 mg by mouth every 6 (six) hours as needed for mild pain or headache.    Marland Kitchen amLODipine (NORVASC) 10 MG tablet Take 10 mg by mouth daily.     Marland Kitchen apixaban (ELIQUIS) 5 MG TABS tablet Take 1 tablet (5 mg total) by mouth 2 (two) times daily. 60 tablet 0  . aspirin EC 81 MG tablet Take 162 mg by mouth daily.    Marland Kitchen escitalopram (LEXAPRO) 20 MG tablet Take 20 mg by mouth daily.    Marland Kitchen estradiol (ESTRACE) 0.5 MG tablet Take 1 mg by mouth daily.     Marland Kitchen ezetimibe (ZETIA) 10 MG tablet Take 1 tablet (10 mg total) by mouth daily. 90 tablet 3  .  irbesartan (AVAPRO) 300 MG tablet Take 300 mg by mouth daily.    Marland Kitchen levothyroxine (SYNTHROID, LEVOTHROID) 100 MCG tablet Take 100 mcg by mouth every evening.    . medroxyPROGESTERone (PROVERA) 5 MG tablet Take 5 mg by mouth daily.    Marland Kitchen omeprazole (PRILOSEC OTC) 20 MG tablet Take 40 mg by mouth daily.     . pravastatin (PRAVACHOL) 80 MG tablet Take 80 mg by mouth daily.       No current  facility-administered medications on file prior to visit.    PAST MEDICAL HISTORY: Past Medical History:  Diagnosis Date  . Arthritis   . Depression   . GERD (gastroesophageal reflux disease)   . Hypercholesteremia   . Hypertension   . Hypothyroidism   . Pre-diabetes 08/19/2019  . Sleep apnea    mild-modreate-tried cpap    PAST SURGICAL HISTORY: Past Surgical History:  Procedure Laterality Date  . CARPAL TUNNEL RELEASE     right  . COLONOSCOPY    . DILATION AND CURETTAGE OF UTERUS    . ORIF ANKLE FRACTURE  04/13/2012   Procedure: OPEN REDUCTION INTERNAL FIXATION (ORIF) ANKLE FRACTURE;  Surgeon: Wylene Simmer, MD;  Location: [facility];  Service: Orthopedics;  Laterality: Left;  . TONSILLECTOMY      FAMILY HISTORY: The patient family history includes Breast cancer in her sister; Colon cancer in her father; Dementia in her mother; Heart attack in her father; Hypertension in her sister, sister, sister, and sister.   SOCIAL HISTORY:  The patient  reports that she has never smoked. She has never used smokeless tobacco. She reports current alcohol use. She reports that she does not use drugs.  REVIEW OF SYSTEMS: Review of Systems  Constitution: Negative for chills, fever and malaise/fatigue.  HENT: Negative for ear discharge, ear pain and nosebleeds.   Eyes: Negative for blurred vision and discharge.  Cardiovascular: Positive for dyspnea on exertion and palpitations. Negative for chest pain, claudication, leg swelling, near-syncope, orthopnea, paroxysmal nocturnal dyspnea and syncope.  Respiratory: Negative for cough and shortness of breath.   Endocrine: Negative for polydipsia, polyphagia and polyuria.  Hematologic/Lymphatic: Negative for bleeding problem.  Skin: Negative for flushing and nail changes.  Musculoskeletal: Negative for muscle cramps, muscle weakness and myalgias.  Gastrointestinal: Negative for abdominal pain, dysphagia, hematemesis, hematochezia,  melena, nausea and vomiting.  Neurological: Negative for dizziness, focal weakness and light-headedness.   PHYSICAL EXAM: Vitals with BMI 03/10/2020 03/07/2020 03/07/2020  Height 5\' 8"  - -  Weight 255 lbs - -  BMI [id number] - -  Systolic 0000000 0000000 -  Diastolic 72 57 -  Pulse 75 57 61    CONSTITUTIONAL: Well-developed and well-nourished. No acute distress.  SKIN: Skin is warm and dry. No rash noted. No cyanosis. No pallor. No jaundice HEAD: Normocephalic and atraumatic.  EYES: No scleral icterus MOUTH/THROAT: Moist oral membranes.  NECK: No JVD present. No thyromegaly noted. No carotid bruits  LYMPHATIC: No visible cervical adenopathy.  CHEST Normal respiratory effort. No intercostal retractions  LUNGS: Clear to auscultation bilaterally.  No stridor. No wheezes. No rales.  CARDIOVASCULAR: Regular rate and rhythm, positive S1-S2, no murmurs rubs or gallops appreciated.   ABDOMINAL: Obese, soft, nontender, nondistended, positive bowel sounds in all 4 quadrants. No apparent ascites.  EXTREMITIES: No peripheral edema  HEMATOLOGIC: No significant bruising NEUROLOGIC: Oriented to person, place, and time. Nonfocal. Normal muscle tone.  PSYCHIATRIC: Normal mood and affect. Normal behavior. Cooperative  CARDIAC DATABASE: EKG: 02/04/2020: Normal sinus rhythm, 60  bpm, normal axis, LVH per voltage criteria, diffuse ST-T changes most likely secondary to LVH but cannot rule out anterolateral ischemia.  Echocardiogram: 02/22/2020: LVEF 59%, moderate concentric LVH, grade 2 diastolic dysfunction, severely dilated left atrium, mild TR.   Stress Testing: Lexiscan (Walking with mod Bruce)Tetrofosmin Stress Test  02/18/2020: Resting EKG/ECG demonstrated atypical atrial flutter with rapid ventricular response. Observed left ventricular hypertrophy with secondary ST-T changes, repolarization abnormality. Peak EKG/ECG revealed no significant ST-T change from baseline abnormality. Patient achieved 101% of MPHR  with modified Bruce protocol.  During infusion the recovery ECG revealed occasional premature  There is a reversible mild defect in the distal anterior and apical regions.  Overall LV systolic function is normal with regional wall motion abnormalities in the same region. Stress LV EF: 55%.  Low risk in view of size of defect and preserved LVEF.    Coronary CTA w/ FFR.  03/07/2020:  1. Mild non-obstructive CAD of the proximal and mid-LAD and proximal LCx, CADRADS = 2. The mid-LAD stenosis may be more significant,however, there is blooming and alignment artifact. Will submit study for FFR, given the abnormal myoview results in the distal anterior and apical regions. 2. Coronary calcium score of 148. This was 82nd percentile for age and sex matched control. 3. Normal coronary origin with right dominance. 4. Dilated main pulmonary artery to 37 mm, suggesting pulmonary hypertension. 5.  Aortic atherosclerosis CT FFR ANALYSIS 1. CT FFR demonstrates flow-limiting stenosis of the proximal to mid-LAD, just upstream of the D1 branch, affecting the mid to distal LAD and a large D2 branch. LAD: Significant stenosis. Proximal FFR = 0.96, Mid FFR = 0.66, Distal FFR = 0.60  Heart Catheterization: None  LABORATORY DATA: BMP Latest Ref Rng & Units 02/29/2020 06/01/2014 04/12/2012  Glucose 65 - 99 mg/dL 124(H) 121(H) 107(H)  BUN 8 - 27 mg/dL 10 10 10   Creatinine 0.57 - 1.00 mg/dL 0.77 0.60 0.67  BUN/Creat Ratio 12 - 28 13 - -  Sodium 134 - 144 mmol/L 141 138 140  Potassium 3.5 - 5.2 mmol/L 4.6 3.6(L) 3.8  Chloride 96 - 106 mmol/L 104 100 102  CO2 20 - 29 mmol/L 22 21 25   Calcium 8.7 - 10.3 mg/dL 9.9 9.8 9.5   CBC Latest Ref Rng & Units 02/29/2020 06/01/2014 04/13/2012  WBC 3.4 - 10.8 x10E3/uL 8.6 8.9 -  Hemoglobin 11.1 - 15.9 g/dL 13.3 13.0 13.6  Hematocrit 34.0 - 46.6 % 39.8 38.6 -  Platelets 150 - 450 x10E3/uL 275 234 -   Lipid profile: 07/11/2019: Total cholesterol 203, triglycerides 208, HDL 56,  LDL 113 01/07/2020: Total cholesterol 231, triglycerides 303, HDL 55, LDL 123  Hemoglobin A1c: 07/11/2019: 6.6 01/07/2020: 6.6  TSH:  01/01/2019 1.62  Serum creatinine: 01/07/2020 0.98 mg/dL  FINAL MEDICATION LIST END OF ENCOUNTER: Meds ordered this encounter  Medications  . metoprolol succinate (TOPROL XL) 50 MG 24 hr tablet    Sig: Take 1 tablet (50 mg total) by mouth in the morning. Hold if systolic blood pressure (top blood pressure number) less than 100 mmHg or heart rate less than 60 bpm (pulse).    Dispense:  90 tablet    Refill:  0  . hydrochlorothiazide (MICROZIDE) 12.5 MG capsule    Sig: Take 1 capsule (12.5 mg total) by mouth in the morning.    Dispense:  90 capsule    Refill:  0     Current Outpatient Medications:  .  acetaminophen (TYLENOL) 500 MG tablet, Take 1,000 mg by  mouth every 6 (six) hours as needed for mild pain or headache., Disp: , Rfl:  .  amLODipine (NORVASC) 10 MG tablet, Take 10 mg by mouth daily. , Disp: , Rfl:  .  apixaban (ELIQUIS) 5 MG TABS tablet, Take 1 tablet (5 mg total) by mouth 2 (two) times daily., Disp: 60 tablet, Rfl: 0 .  aspirin EC 81 MG tablet, Take 162 mg by mouth daily., Disp: , Rfl:  .  escitalopram (LEXAPRO) 20 MG tablet, Take 20 mg by mouth daily., Disp: , Rfl:  .  estradiol (ESTRACE) 0.5 MG tablet, Take 1 mg by mouth daily. , Disp: , Rfl:  .  ezetimibe (ZETIA) 10 MG tablet, Take 1 tablet (10 mg total) by mouth daily., Disp: 90 tablet, Rfl: 3 .  irbesartan (AVAPRO) 300 MG tablet, Take 300 mg by mouth daily., Disp: , Rfl:  .  levothyroxine (SYNTHROID, LEVOTHROID) 100 MCG tablet, Take 100 mcg by mouth every evening., Disp: , Rfl:  .  medroxyPROGESTERone (PROVERA) 5 MG tablet, Take 5 mg by mouth daily., Disp: , Rfl:  .  omeprazole (PRILOSEC OTC) 20 MG tablet, Take 40 mg by mouth daily. , Disp: , Rfl:  .  pravastatin (PRAVACHOL) 80 MG tablet, Take 80 mg by mouth daily.  , Disp: , Rfl:  .  hydrochlorothiazide (MICROZIDE) 12.5 MG capsule,  Take 1 capsule (12.5 mg total) by mouth in the morning., Disp: 90 capsule, Rfl: 0 .  metoprolol succinate (TOPROL XL) 50 MG 24 hr tablet, Take 1 tablet (50 mg total) by mouth in the morning. Hold if systolic blood pressure (top blood pressure number) less than 100 mmHg or heart rate less than 60 bpm (pulse)., Disp: 90 tablet, Rfl: 0  IMPRESSION:    ICD-10-CM   1. Abnormal nuclear stress test  R94.39   2. Paroxysmal atrial flutter (HCC)  I48.92 metoprolol succinate (TOPROL XL) 50 MG 24 hr tablet  3. Long term (current) use of anticoagulants  Z79.01   4. Essential hypertension  I10 hydrochlorothiazide (MICROZIDE) 12.5 MG capsule    Basic metabolic panel    Magnesium  5. Mixed hyperlipidemia  E78.2   6. Type 2 diabetes mellitus without complication, without long-term current use of insulin (HCC)  E11.9   7. Hypothyroidism, unspecified type  E03.9   8. Class 2 severe obesity due to excess calories with serious comorbidity and body mass index (BMI) of 38.0 to 38.9 in adult [facility])  E66.01    Z68.38      RECOMMENDATIONS: [name] is a 69 y.o. female whose past medical history and cardiac risk factors include: Coronary artery disease, hypertension, hyperlipidemia, hypothyroidism, GERD, postmenopausal female, advanced age, non-insulin-dependent diabetes mellitus type 2, newly discovered paroxysmal atrial flutter during stress test.  Abnormal nuclear stress test.  There appears to be a reversible perfusion defect in the distribution of the LAD suggestive of underlying ischemia.  With coronary CTA with FFR analysis suggestive of flow-limiting stenosis in the proximal to mid LAD.  Symptomatically, patient does have effort related dyspnea which could be her anginal equivalent or secondary to elevated blood pressures.  We will start hydrochlorothiazide 12.5 mg p.o. daily.  This will help her systolic blood pressures and also facilitate diuresis as her echocardiogram noted grade 2 diastolic  dysfunction with elevated left atrial pressures.    Check blood work prior to left heart catheterization to reevaluate kidney function and electrolytes.    We will transition her from Lopressor to metoprolol succinate.    In  the setting of diabetes mellitus, would recommend an LDL level less than or equal to 70 mg/dL.  Patient will follow up with her primary in regards to another repeat lipid profile.  The left heart catheterization procedure was explained to the patient in detail. The indication, alternatives, risks and benefits were reviewed. Complications including but not limited to bleeding, infection, acute kidney injury, blood transfusion, heart rhythm disturbances, contrast (dye) reaction, damage to the arteries or nerves in the legs or hands, cerebrovascular accident, myocardial infarction, need for emergent bypass surgery, blood clots in the legs, possible need for emergent blood transfusion, and rarely death were reviewed and discussed with the patient. The patient voices understanding and wishes to proceed.   Paroxysmal atrial flutter: . Rate control: Metoprolol succinate. Marland Kitchen Rhythm control: N/A. Marland Kitchen Thromboembolic prophylaxis: Eliquis.   . CHA2DS2-VASc SCORE is 5 which correlates to 6.7% risk of stroke per year.  . We emphasized the benefits, risks, and alternatives of anticoagulation.   Long-term oral anticoagulation:  Indication paroxysmal atrial flutter.  No prior history of intracranial bleeding or internal bleeding.  No recent surgeries.  Hemoglobin at the time [facility] was initiated was 13.3 g/dL.  Benign essential hypertension:  Currently being managed by primary team.  However, since the patient has an elevated left atrial pressure and effort related dyspnea.  Will starting hydrochlorothiazide 12.5 mg p.o. every morning.  Recommended to follow-up with primary team in regards to further blood pressure management.  Educated on the importance of a low-salt diet.  Mixed  hyperlipidemia:  Continue pravastatin and Zetia.  Currently managed by primary team.  Non-insulin-dependent diabetes mellitus type 2: Currently managed by primary team.  Obesity, due to excess calories: Body mass index is 38.77 kg/m. . I reviewed with the patient the importance of diet, regular physical activity/exercise, weight loss.   . Patient is educated on increasing physical activity gradually as tolerated.  With the goal of moderate intensity exercise for 30 minutes a day 5 days a week.  Orders Placed This Encounter  Procedures  . Basic metabolic panel  . Magnesium   --Continue cardiac medications as reconciled in final medication list. --Return in about 2 weeks (around 03/24/2020) for post heart catheterization  follow up.. Or sooner if needed. --Continue follow-up with your primary care physician regarding the management of your other chronic comorbid conditions.  Patient's questions and concerns were addressed to her satisfaction. She voices understanding of the instructions provided during this encounter.   This note was created using a voice recognition software as a result there may be grammatical errors inadvertently enclosed that do not reflect the nature of this encounter. Every attempt is made to correct such errors.  Rex Kras, [place], [facility]  Pager: [phone] Office: [phone]

## 2020-03-10 NOTE — Progress Notes (Signed)
Patient stated that she works T,W,T, so she is asking can she come in this afternoon, because she is off today. Please advise.

## 2020-03-10 NOTE — Progress Notes (Signed)
Sure

## 2020-03-10 NOTE — Progress Notes (Signed)
Patient called back, will be here in 20 minutes.

## 2020-03-10 NOTE — Telephone Encounter (Signed)
-----   Message from [place], [place] sent at 03/09/2020 11:13 PM EDT ----- Please have the patient come in for follow-up on Wednesday, Mar 12, 2020.  In the meantime if she has any symptoms of chest pain or shortness of breath at rest or with activities she needs to be seen sooner at the closest ER via EMS.

## 2020-03-14 ENCOUNTER — Other Ambulatory Visit (HOSPITAL_COMMUNITY)
Admission: RE | Admit: 2020-03-14 | Discharge: 2020-03-14 | Disposition: A | Discharge status: Home / Self Care | Payer: PPO | Source: Ambulatory Visit | Attending: Cardiology | Admitting: Cardiology

## 2020-03-14 DIAGNOSIS — Z01812 Encounter for preprocedural laboratory examination: Secondary | ICD-10-CM | POA: Diagnosis not present

## 2020-03-14 DIAGNOSIS — Z20822 Contact with and (suspected) exposure to covid-19: Secondary | ICD-10-CM | POA: Insufficient documentation

## 2020-03-14 LAB — SARS CORONAVIRUS 2 (TAT 6-24 HRS): SARS Coronavirus 2: NEGATIVE

## 2020-03-17 DIAGNOSIS — I2510 Atherosclerotic heart disease of native coronary artery without angina pectoris: Secondary | ICD-10-CM | POA: Diagnosis present

## 2020-03-17 DIAGNOSIS — R9389 Abnormal findings on diagnostic imaging of other specified body structures: Secondary | ICD-10-CM | POA: Diagnosis present

## 2020-03-18 ENCOUNTER — Ambulatory Visit (HOSPITAL_COMMUNITY)
Admission: RE | Admit: 2020-03-18 | Discharge: 2020-03-19 | Disposition: A | Discharge status: Home / Self Care | Payer: PPO | Attending: Cardiology | Admitting: Cardiology

## 2020-03-18 ENCOUNTER — Other Ambulatory Visit: Payer: Self-pay

## 2020-03-18 ENCOUNTER — Encounter (HOSPITAL_COMMUNITY)
Admission: RE | Disposition: A | Discharge status: Home / Self Care | Payer: Self-pay | Source: Home / Self Care | Attending: Cardiology

## 2020-03-18 DIAGNOSIS — R7303 Prediabetes: Secondary | ICD-10-CM | POA: Diagnosis not present

## 2020-03-18 DIAGNOSIS — M1990 Unspecified osteoarthritis, unspecified site: Secondary | ICD-10-CM | POA: Diagnosis not present

## 2020-03-18 DIAGNOSIS — E782 Mixed hyperlipidemia: Secondary | ICD-10-CM | POA: Insufficient documentation

## 2020-03-18 DIAGNOSIS — Z79890 Hormone replacement therapy (postmenopausal): Secondary | ICD-10-CM | POA: Diagnosis not present

## 2020-03-18 DIAGNOSIS — R0600 Dyspnea, unspecified: Secondary | ICD-10-CM | POA: Insufficient documentation

## 2020-03-18 DIAGNOSIS — Z888 Allergy status to other drugs, medicaments and biological substances status: Secondary | ICD-10-CM | POA: Insufficient documentation

## 2020-03-18 DIAGNOSIS — Z79899 Other long term (current) drug therapy: Secondary | ICD-10-CM | POA: Insufficient documentation

## 2020-03-18 DIAGNOSIS — R9439 Abnormal result of other cardiovascular function study: Secondary | ICD-10-CM | POA: Diagnosis not present

## 2020-03-18 DIAGNOSIS — I4892 Unspecified atrial flutter: Secondary | ICD-10-CM | POA: Insufficient documentation

## 2020-03-18 DIAGNOSIS — Z881 Allergy status to other antibiotic agents status: Secondary | ICD-10-CM | POA: Diagnosis not present

## 2020-03-18 DIAGNOSIS — Z6837 Body mass index (BMI) 37.0-37.9, adult: Secondary | ICD-10-CM | POA: Insufficient documentation

## 2020-03-18 DIAGNOSIS — I119 Hypertensive heart disease without heart failure: Secondary | ICD-10-CM | POA: Diagnosis not present

## 2020-03-18 DIAGNOSIS — I2510 Atherosclerotic heart disease of native coronary artery without angina pectoris: Secondary | ICD-10-CM | POA: Diagnosis present

## 2020-03-18 DIAGNOSIS — E039 Hypothyroidism, unspecified: Secondary | ICD-10-CM | POA: Insufficient documentation

## 2020-03-18 DIAGNOSIS — I2584 Coronary atherosclerosis due to calcified coronary lesion: Secondary | ICD-10-CM | POA: Insufficient documentation

## 2020-03-18 DIAGNOSIS — R9389 Abnormal findings on diagnostic imaging of other specified body structures: Secondary | ICD-10-CM | POA: Diagnosis present

## 2020-03-18 DIAGNOSIS — Z8249 Family history of ischemic heart disease and other diseases of the circulatory system: Secondary | ICD-10-CM | POA: Insufficient documentation

## 2020-03-18 DIAGNOSIS — E6609 Other obesity due to excess calories: Secondary | ICD-10-CM | POA: Diagnosis not present

## 2020-03-18 DIAGNOSIS — Z955 Presence of coronary angioplasty implant and graft: Secondary | ICD-10-CM

## 2020-03-18 DIAGNOSIS — Z7901 Long term (current) use of anticoagulants: Secondary | ICD-10-CM | POA: Insufficient documentation

## 2020-03-18 DIAGNOSIS — I25118 Atherosclerotic heart disease of native coronary artery with other forms of angina pectoris: Secondary | ICD-10-CM | POA: Diagnosis not present

## 2020-03-18 DIAGNOSIS — G4730 Sleep apnea, unspecified: Secondary | ICD-10-CM | POA: Insufficient documentation

## 2020-03-18 DIAGNOSIS — F329 Major depressive disorder, single episode, unspecified: Secondary | ICD-10-CM | POA: Insufficient documentation

## 2020-03-18 DIAGNOSIS — K219 Gastro-esophageal reflux disease without esophagitis: Secondary | ICD-10-CM | POA: Insufficient documentation

## 2020-03-18 DIAGNOSIS — Z9861 Coronary angioplasty status: Secondary | ICD-10-CM | POA: Diagnosis not present

## 2020-03-18 HISTORY — PX: INTRAVASCULAR ULTRASOUND/IVUS: CATH118244

## 2020-03-18 HISTORY — PX: LEFT HEART CATH AND CORONARY ANGIOGRAPHY: CATH118249

## 2020-03-18 HISTORY — PX: CORONARY STENT INTERVENTION: CATH118234

## 2020-03-18 LAB — POCT ACTIVATED CLOTTING TIME
Activated Clotting Time: 241 seconds
Activated Clotting Time: 422 seconds
Activated Clotting Time: 224 seconds
Activated Clotting Time: 279 seconds

## 2020-03-18 SURGERY — LEFT HEART CATH AND CORONARY ANGIOGRAPHY
Anesthesia: LOCAL

## 2020-03-18 MED ORDER — HEPARIN (PORCINE) IN NACL 1000-0.9 UT/500ML-% IV SOLN
INTRAVENOUS | Status: AC
  Filled 2020-03-18: qty 1000

## 2020-03-18 MED ORDER — SODIUM CHLORIDE 0.9 % IV SOLN: 250.0000 mL | INTRAVENOUS | Status: DC | PRN

## 2020-03-18 MED ORDER — MIDAZOLAM HCL 2 MG/2ML IJ SOLN
INTRAMUSCULAR | Status: DC | PRN
  Administered 2020-03-18: 1 mg via INTRAVENOUS

## 2020-03-18 MED ORDER — CLOPIDOGREL BISULFATE 300 MG PO TABS
ORAL_TABLET | ORAL | Status: DC | PRN
  Administered 2020-03-18: 600 mg via ORAL

## 2020-03-18 MED ORDER — LIDOCAINE HCL (PF) 1 % IJ SOLN
INTRAMUSCULAR | Status: AC
  Filled 2020-03-18: qty 30

## 2020-03-18 MED ORDER — AMLODIPINE BESYLATE 10 MG PO TABS
10.0000 mg | ORAL_TABLET | Freq: Every day | ORAL | Status: DC
  Administered 2020-03-19: 10 mg via ORAL
  Filled 2020-03-18: qty 1

## 2020-03-18 MED ORDER — HEPARIN SODIUM (PORCINE) 1000 UNIT/ML IJ SOLN
INTRAMUSCULAR | Status: DC | PRN
  Administered 2020-03-18: 2000 [IU] via INTRAVENOUS
  Administered 2020-03-18 (×3): 3000 [IU] via INTRAVENOUS
  Administered 2020-03-18: 7000 [IU] via INTRAVENOUS

## 2020-03-18 MED ORDER — FENTANYL CITRATE (PF) 100 MCG/2ML IJ SOLN
INTRAMUSCULAR | Status: AC
  Filled 2020-03-18: qty 2

## 2020-03-18 MED ORDER — ACETAMINOPHEN 325 MG PO TABS: 650.0000 mg | ORAL_TABLET | ORAL | Status: DC | PRN

## 2020-03-18 MED ORDER — LIDOCAINE HCL (PF) 1 % IJ SOLN
INTRAMUSCULAR | Status: DC | PRN
  Administered 2020-03-18: 2 mL

## 2020-03-18 MED ORDER — ASPIRIN 81 MG PO CHEW
CHEWABLE_TABLET | ORAL | Status: AC
  Filled 2020-03-18: qty 1

## 2020-03-18 MED ORDER — ASPIRIN 81 MG PO CHEW
81.0000 mg | CHEWABLE_TABLET | ORAL | Status: AC
  Administered 2020-03-18: 81 mg via ORAL

## 2020-03-18 MED ORDER — ATORVASTATIN CALCIUM 80 MG PO TABS: 80.0000 mg | ORAL_TABLET | Freq: Every day | ORAL | 1 refills | Status: DC

## 2020-03-18 MED ORDER — SODIUM CHLORIDE 0.9 % WEIGHT BASED INFUSION
3.0000 mL/kg/h | INTRAVENOUS | Status: AC
  Administered 2020-03-18: 3 mL/kg/h via INTRAVENOUS

## 2020-03-18 MED ORDER — PANTOPRAZOLE SODIUM 40 MG PO TBEC
40.0000 mg | DELAYED_RELEASE_TABLET | Freq: Every day | ORAL | Status: DC
  Administered 2020-03-18 – 2020-03-19 (×2): 40 mg via ORAL
  Filled 2020-03-18 (×2): qty 1

## 2020-03-18 MED ORDER — SODIUM CHLORIDE 0.9% FLUSH
3.0000 mL | Freq: Two times a day (BID) | INTRAVENOUS | Status: DC
  Administered 2020-03-18: 3 mL via INTRAVENOUS

## 2020-03-18 MED ORDER — CLOPIDOGREL BISULFATE 300 MG PO TABS
ORAL_TABLET | ORAL | Status: AC
  Filled 2020-03-18: qty 2

## 2020-03-18 MED ORDER — NITROGLYCERIN 1 MG/10 ML FOR IR/CATH LAB
INTRA_ARTERIAL | Status: DC | PRN
  Administered 2020-03-18: 200 ug

## 2020-03-18 MED ORDER — ONDANSETRON HCL 4 MG/2ML IJ SOLN: 4.0000 mg | Freq: Four times a day (QID) | INTRAMUSCULAR | Status: DC | PRN

## 2020-03-18 MED ORDER — HYDRALAZINE HCL 20 MG/ML IJ SOLN: 10.0000 mg | INTRAMUSCULAR | Status: AC | PRN

## 2020-03-18 MED ORDER — CLOPIDOGREL BISULFATE 75 MG PO TABS: 75.0000 mg | ORAL_TABLET | Freq: Every day | ORAL | 1 refills | Status: DC

## 2020-03-18 MED ORDER — SODIUM CHLORIDE 0.9% FLUSH: 3.0000 mL | Freq: Two times a day (BID) | INTRAVENOUS | Status: DC

## 2020-03-18 MED ORDER — LEVOTHYROXINE SODIUM 100 MCG PO TABS
100.0000 ug | ORAL_TABLET | Freq: Every day | ORAL | Status: DC
  Administered 2020-03-18: 100 ug via ORAL
  Filled 2020-03-18: qty 1

## 2020-03-18 MED ORDER — LABETALOL HCL 5 MG/ML IV SOLN: 10.0000 mg | INTRAVENOUS | Status: AC | PRN

## 2020-03-18 MED ORDER — MIDAZOLAM HCL 2 MG/2ML IJ SOLN
INTRAMUSCULAR | Status: AC
  Filled 2020-03-18: qty 2

## 2020-03-18 MED ORDER — VERAPAMIL HCL 2.5 MG/ML IV SOLN
INTRAVENOUS | Status: AC
  Filled 2020-03-18: qty 2

## 2020-03-18 MED ORDER — ESCITALOPRAM OXALATE 20 MG PO TABS
20.0000 mg | ORAL_TABLET | Freq: Every day | ORAL | Status: DC
  Administered 2020-03-18 – 2020-03-19 (×2): 20 mg via ORAL
  Filled 2020-03-18: qty 2
  Filled 2020-03-18 (×2): qty 1
  Filled 2020-03-18: qty 2

## 2020-03-18 MED ORDER — ESTRADIOL 1 MG PO TABS
1.0000 mg | ORAL_TABLET | Freq: Every day | ORAL | Status: DC
  Administered 2020-03-18 – 2020-03-19 (×2): 1 mg via ORAL
  Filled 2020-03-18 (×2): qty 1

## 2020-03-18 MED ORDER — MEDROXYPROGESTERONE ACETATE 2.5 MG PO TABS
5.0000 mg | ORAL_TABLET | Freq: Every day | ORAL | Status: DC
  Administered 2020-03-18 – 2020-03-19 (×2): 5 mg via ORAL
  Filled 2020-03-18 (×2): qty 2

## 2020-03-18 MED ORDER — PRAVASTATIN SODIUM 40 MG PO TABS: 80.0000 mg | ORAL_TABLET | Freq: Every day | ORAL | Status: DC

## 2020-03-18 MED ORDER — PANTOPRAZOLE SODIUM 40 MG PO TBEC: 40.0000 mg | DELAYED_RELEASE_TABLET | Freq: Every day | ORAL | 1 refills | Status: DC

## 2020-03-18 MED ORDER — ATORVASTATIN CALCIUM 80 MG PO TABS
80.0000 mg | ORAL_TABLET | Freq: Every day | ORAL | Status: DC
  Administered 2020-03-18 – 2020-03-19 (×2): 80 mg via ORAL
  Filled 2020-03-18 (×2): qty 1

## 2020-03-18 MED ORDER — EZETIMIBE 10 MG PO TABS
10.0000 mg | ORAL_TABLET | Freq: Every day | ORAL | Status: DC
  Administered 2020-03-18 – 2020-03-19 (×2): 10 mg via ORAL
  Filled 2020-03-18 (×2): qty 1

## 2020-03-18 MED ORDER — HEPARIN SODIUM (PORCINE) 1000 UNIT/ML IJ SOLN
INTRAMUSCULAR | Status: AC
  Filled 2020-03-18: qty 1

## 2020-03-18 MED ORDER — FENTANYL CITRATE (PF) 100 MCG/2ML IJ SOLN
INTRAMUSCULAR | Status: DC | PRN
  Administered 2020-03-18 (×2): 50 ug via INTRAVENOUS

## 2020-03-18 MED ORDER — METOPROLOL SUCCINATE ER 100 MG PO TB24
100.0000 mg | ORAL_TABLET | Freq: Every morning | ORAL | Status: DC
  Administered 2020-03-19: 100 mg via ORAL
  Filled 2020-03-18: qty 1

## 2020-03-18 MED ORDER — SODIUM CHLORIDE 0.9% FLUSH: 3.0000 mL | INTRAVENOUS | Status: DC | PRN

## 2020-03-18 MED ORDER — VERAPAMIL HCL 2.5 MG/ML IV SOLN: INTRA_ARTERIAL | Status: DC | PRN

## 2020-03-18 MED ORDER — SODIUM CHLORIDE 0.9 % WEIGHT BASED INFUSION
1.0000 mL/kg/h | INTRAVENOUS | Status: DC
  Administered 2020-03-18: 1 mL/kg/h via INTRAVENOUS

## 2020-03-18 MED ORDER — SODIUM CHLORIDE 0.9 % IV SOLN: INTRAVENOUS | Status: AC

## 2020-03-18 MED ORDER — CLOPIDOGREL BISULFATE 75 MG PO TABS
75.0000 mg | ORAL_TABLET | Freq: Every day | ORAL | Status: DC
  Filled 2020-03-18: qty 1

## 2020-03-18 MED ORDER — HEPARIN (PORCINE) IN NACL 1000-0.9 UT/500ML-% IV SOLN
INTRAVENOUS | Status: DC | PRN
  Administered 2020-03-18 (×2): 500 mL

## 2020-03-18 MED ORDER — NITROGLYCERIN 1 MG/10 ML FOR IR/CATH LAB
INTRA_ARTERIAL | Status: AC
  Filled 2020-03-18: qty 10

## 2020-03-18 SURGICAL SUPPLY — 36 items
BALLN SAPPHIRE 2.5X15 (BALLOONS) ×2
BALLN SAPPHIRE 3.0X15 (BALLOONS) ×2
BALLN WOLVERINE 2.50X6 (BALLOONS) ×2
BALLN WOLVERINE 3.00X6 (BALLOONS) ×2
BALLN WOLVERINE 3.50X10 (BALLOONS) ×2
BALLN WOLVERINE 3.75X10 (BALLOONS) ×2
BALLN ~~LOC~~ EMERGE MR 3.0X8 (BALLOONS) ×2
BALLN ~~LOC~~ EMERGE MR 4.0X12 (BALLOONS) ×2
BALLOON SAPPHIRE 2.5X15 (BALLOONS) IMPLANT
BALLOON SAPPHIRE 3.0X15 (BALLOONS) IMPLANT
BALLOON WOLVERINE 2.50X6 (BALLOONS) IMPLANT
BALLOON WOLVERINE 3.00X6 (BALLOONS) IMPLANT
BALLOON WOLVERINE 3.50X10 (BALLOONS) IMPLANT
BALLOON WOLVERINE 3.75X10 (BALLOONS) IMPLANT
BALLOON ~~LOC~~ EMERGE MR 3.0X8 (BALLOONS) IMPLANT
BALLOON ~~LOC~~ EMERGE MR 4.0X12 (BALLOONS) IMPLANT
CATH LAUNCHER 6FR EBU 3 (CATHETERS) ×1 IMPLANT
CATH OPTICROSS HD (CATHETERS) ×1 IMPLANT
CATH OPTITORQUE TIG 4.0 5F (CATHETERS) ×1 IMPLANT
DEVICE RAD COMP TR BAND LRG (VASCULAR PRODUCTS) ×1 IMPLANT
GLIDESHEATH SLEND A-KIT 6F 22G (SHEATH) ×1 IMPLANT
GUIDEWIRE INQWIRE 1.5J.035X260 (WIRE) IMPLANT
INQWIRE 1.5J .035X260CM (WIRE) ×2
KIT ENCORE 26 ADVANTAGE (KITS) ×1 IMPLANT
KIT HEART LEFT (KITS) ×2 IMPLANT
KIT HEMO VALVE WATCHDOG (MISCELLANEOUS) ×1 IMPLANT
PACK CARDIAC CATHETERIZATION (CUSTOM PROCEDURE TRAY) ×2 IMPLANT
SLED PULL BACK IVUS (MISCELLANEOUS) ×1 IMPLANT
STENT RESOLUTE ONYX 2.5X12 (Permanent Stent) ×1 IMPLANT
STENT RESOLUTE ONYX 3.5X15 (Permanent Stent) ×1 IMPLANT
SYR MEDRAD MARK 7 150ML (SYRINGE) ×2 IMPLANT
TRANSDUCER W/STOPCOCK (MISCELLANEOUS) ×2 IMPLANT
TUBING CIL FLEX 10 FLL-RA (TUBING) ×2 IMPLANT
WIRE ASAHI PROWATER 180CM (WIRE) ×1 IMPLANT
WIRE COUGAR XT STRL 190CM (WIRE) ×1 IMPLANT
WIRE HI TORQ VERSACORE-J 145CM (WIRE) ×1 IMPLANT

## 2020-03-18 NOTE — Interval H&P Note (Signed)
History and Physical Interval Note:  03/18/2020 3:57 PM  [name]  has presented today for surgery, with the diagnosis of abnormal stress test - hp.  The various methods of treatment have been discussed with the patient and family. After consideration of risks, benefits and other options for treatment, the patient has consented to  Procedure(s): LEFT HEART CATH AND CORONARY ANGIOGRAPHY (N/A) as a surgical intervention.  The patient's history has been reviewed, patient examined, no change in status, stable for surgery.  I have reviewed the patient's chart and labs.  Questions were answered to the patient's satisfaction.    2016/2017 Appropriate Use Criteria for Coronary Revascularization Clinical Presentation: Diabetes Mellitus? Symptom Status? S/P CABG? Antianginal Therapy (# of long-acting drugs)? Results of Non-invasive testing? FFR/iFR results in all diseased vessels? Patient undergoing renal transplant? Patient undergoing percutaneous valve procedure (TAVR, MitraClip, Others)? Symptom Status:  Ischemic Symptoms  Non-invasive Testing:  High risk  If no or indeterminate stress test, FFR/iFR results in all diseased vessels:  N/A  Diabetes Mellitus:  Yes  S/P CABG:  No  Antianginal therapy (number of long-acting drugs):  1  Patient undergoing renal transplant:  No  Patient undergoing percutaneous valve procedure:  No    newline 1 Vessel Disease PCI CABG  No proximal LAD involvement, No proximal left dominant LCX involvement M (6); Indication 2 M (4); Indication 2   Proximal left dominant LCX involvement A (7); Indication 5 A (7); Indication 5   Proximal LAD involvement A (7); Indication 5 A (7); Indication 5   newline 2 Vessel Disease  No proximal LAD involvement A (7); Indication 8 M (6); Indication 8   Proximal LAD involvement A (7); Indication 14 A (8); Indication 14   newline 3 Vessel Disease  Low disease complexity (e.g., focal stenoses, SYNTAX <=22) A (7); Indication 19 A  (8); Indication 19   Intermediate or high disease complexity (e.g., SYNTAX >=23) M (5); Indication 23 A (8); Indication 23   newline Left Main Disease  Isolated LMCA disease: ostial or midshaft A (7); Indication 24 A (9); Indication 24   Isolated LMCA disease: bifurcation involvement M (5); Indication 25 A (9); Indication 25   LMCA ostial or midshaft, concurrent low disease burden multivessel disease (e.g., 1-2 additional focal stenoses, SYNTAX <=22) A (7); Indication 26 A (9); Indication 26   LMCA ostial or midshaft, concurrent intermediate or high disease burden multivessel disease (e.g., 1-2 additional bifurcation stenoses, long stenoses, SYNTAX >=23) M (4); Indication 27 A (9); Indication 27   LMCA bifurcation involvement, concurrent low disease burden multivessel disease (e.g., 1-2 additional focal stenoses, SYNTAX <=22) M (5); Indication 28 A (9); Indication 28   LMCA bifurcation involvement, concurrent intermediate or high disease burden multivessel disease (e.g., 1-2 additional bifurcation stenoses, long stenoses, SYNTAX >=23) R (3); Indication 29 A (9); Indication [place]

## 2020-03-18 NOTE — H&P (Signed)
OV 03/10/2020    [name] Date of Birth: 1950/11/02 MRN: [id number] Primary Care Provider:Prevost, Leonia Reader, FNP Primary Cardiologist: Nigel Mormon, MD  (established care 02/04/2020).   Date: 03/10/20 Last Office Visit: 02/29/2020  No chief complaint on file.   HPI  [name] is a 69 y.o. female who presents to the office with a chief complaint of " review test results and effort related dyspnea." Patient's past medical history and cardiac risk factors include: Coronary artery disease, hypertension, hyperlipidemia, hypothyroidism, GERD, postmenopausal female, advanced age, non-insulin-dependent diabetes mellitus type 2, newly discovered paroxysmal atrial flutter during stress test.  Patient was referred to the office in April 2021 at the request of her primary care provider for evaluation of abnormal EKG and worsening cardiovascular risk factors.  At the last office visit he decided to undergo an echocardiogram and stress test to further risk stratify the patient given her EKG changes.  Echo nuclear stress test results noted below for further reference.  She was noted to have reversible perfusion defect involving the LAD distribution.  Symptomatically patient was not having any chest pain at rest or with effort related activities but did have effort related dyspnea which could be her anginal equivalent or secondary to uncontrolled hypertension.  Shared decision was to proceed with coronary CTA to evaluate for obstructive CAD.  Patient underwent coronary CTA last Friday and was noted to have disease in the LAD distribution with CT FFR reading less than 0.8.  She was asked to come into the office for further evaluation and reviewing the test results.  Since our last office visit patient states that her symptoms are relatively stable.  She does not have any chest pain at rest or with effort related activities.  Her shortness of breath is mostly with effort related activities.  She  did bring her blood pressure log in for review and her systolic blood pressures are not well controlled.  Started on Eliquis at the last visit given her newly discovered paroxysmal atrial flutter.  She does not endorse any evidence of bleeding.  She states that she had 1 episode of flutter-like sensation lasting for about 30 minutes which resolved after taking 2 baby aspirins.   Denies prior history of myocardial infarction, congestive heart failure, deep venous thrombosis, pulmonary embolism, stroke, transient ischemic attack.  FUNCTIONAL STATUS: She tries to walk at least 20 minutes a day as weather permitting.  ALLERGIES: Allergies  Allergen Reactions  . Benazepril Cough  . Ciprofloxacin Itching  . Naproxen Sodium Other (See Comments)    Stomach burns    MEDICATION LIST PRIOR TO VISIT: No current facility-administered medications on file prior to encounter.   Current Outpatient Medications on File Prior to Encounter  Medication Sig Dispense Refill  . acetaminophen (TYLENOL) 500 MG tablet Take 1,000 mg by mouth every 6 (six) hours as needed for mild pain or headache.    Marland Kitchen amLODipine (NORVASC) 10 MG tablet Take 10 mg by mouth daily.     Marland Kitchen apixaban (ELIQUIS) 5 MG TABS tablet Take 1 tablet (5 mg total) by mouth 2 (two) times daily. 60 tablet 0  . escitalopram (LEXAPRO) 20 MG tablet Take 20 mg by mouth daily.    Marland Kitchen estradiol (ESTRACE) 1 MG tablet Take 1 mg by mouth daily.     Marland Kitchen ezetimibe (ZETIA) 10 MG tablet Take 1 tablet (10 mg total) by mouth daily. 90 tablet 3  . irbesartan (AVAPRO) 300 MG tablet Take 300 mg by mouth  daily.    . levothyroxine (SYNTHROID, LEVOTHROID) 100 MCG tablet Take 100 mcg by mouth at bedtime.     . medroxyPROGESTERone (PROVERA) 5 MG tablet Take 5 mg by mouth daily.    . metoprolol succinate (TOPROL XL) 50 MG 24 hr tablet Take 1 tablet (50 mg total) by mouth in the morning. Hold if systolic blood pressure (top blood pressure number) less than 100 mmHg or heart rate  less than 60 bpm (pulse). (Patient taking differently: Take 100 mg by mouth in the morning. Hold if systolic blood pressure (top blood pressure number) less than 100 mmHg or heart rate less than 60 bpm (pulse).) 90 tablet 0  . omeprazole (PRILOSEC OTC) 20 MG tablet Take 20 mg by mouth daily.     . pravastatin (PRAVACHOL) 80 MG tablet Take 80 mg by mouth daily.      . hydrochlorothiazide (MICROZIDE) 12.5 MG capsule Take 1 capsule (12.5 mg total) by mouth in the morning. 90 capsule 0    PAST MEDICAL HISTORY: Past Medical History:  Diagnosis Date  . Arthritis   . Coronary artery disease   . Depression   . Diabetes mellitus without complication ([place])   . GERD (gastroesophageal reflux disease)   . Hypercholesteremia   . Hypertension   . Hypothyroidism   . Pre-diabetes 08/19/2019  . Sleep apnea    mild-modreate-tried cpap    PAST SURGICAL HISTORY: Past Surgical History:  Procedure Laterality Date  . CARPAL TUNNEL RELEASE     right  . COLONOSCOPY    . DILATION AND CURETTAGE OF UTERUS    . ORIF ANKLE FRACTURE  04/13/2012   Procedure: OPEN REDUCTION INTERNAL FIXATION (ORIF) ANKLE FRACTURE;  Surgeon: Wylene Simmer, MD;  Location: [facility];  Service: Orthopedics;  Laterality: Left;  . TONSILLECTOMY      FAMILY HISTORY: The patient family history includes Breast cancer in her sister; Colon cancer in her father; Dementia in her mother; Heart attack in her father; Hypertension in her sister, sister, sister, and sister.   SOCIAL HISTORY:  The patient  reports that she has never smoked. She has never used smokeless tobacco. She reports current alcohol use. She reports that she does not use drugs.  REVIEW OF SYSTEMS: Review of Systems  Constitution: Negative for chills, fever and malaise/fatigue.  HENT: Negative for ear discharge, ear pain and nosebleeds.   Eyes: Negative for blurred vision and discharge.  Cardiovascular: Positive for dyspnea on exertion and palpitations.  Negative for chest pain, claudication, leg swelling, near-syncope, orthopnea, paroxysmal nocturnal dyspnea and syncope.  Respiratory: Negative for cough and shortness of breath.   Endocrine: Negative for polydipsia, polyphagia and polyuria.  Hematologic/Lymphatic: Negative for bleeding problem.  Skin: Negative for flushing and nail changes.  Musculoskeletal: Negative for muscle cramps, muscle weakness and myalgias.  Gastrointestinal: Negative for abdominal pain, dysphagia, hematemesis, hematochezia, melena, nausea and vomiting.  Neurological: Negative for dizziness, focal weakness and light-headedness.   PHYSICAL EXAM: Vitals with BMI 03/10/2020 03/07/2020 03/07/2020  Height 5\' 8"  - -  Weight 255 lbs - -  BMI [id number] - -  Systolic 0000000 0000000 -  Diastolic 72 57 -  Pulse 75 57 61    CONSTITUTIONAL: Well-developed and well-nourished. No acute distress.  SKIN: Skin is warm and dry. No rash noted. No cyanosis. No pallor. No jaundice HEAD: Normocephalic and atraumatic.  EYES: No scleral icterus MOUTH/THROAT: Moist oral membranes.  NECK: No JVD present. No thyromegaly noted. No carotid bruits  LYMPHATIC: No visible  cervical adenopathy.  CHEST Normal respiratory effort. No intercostal retractions  LUNGS: Clear to auscultation bilaterally.  No stridor. No wheezes. No rales.  CARDIOVASCULAR: Regular rate and rhythm, positive S1-S2, no murmurs rubs or gallops appreciated.   ABDOMINAL: Obese, soft, nontender, nondistended, positive bowel sounds in all 4 quadrants. No apparent ascites.  EXTREMITIES: No peripheral edema  HEMATOLOGIC: No significant bruising NEUROLOGIC: Oriented to person, place, and time. Nonfocal. Normal muscle tone.  PSYCHIATRIC: Normal mood and affect. Normal behavior. Cooperative  CARDIAC DATABASE: EKG: 02/04/2020: Normal sinus rhythm, 60 bpm, normal axis, LVH per voltage criteria, diffuse ST-T changes most likely secondary to LVH but cannot rule out anterolateral  ischemia.  Echocardiogram: 02/22/2020: LVEF 59%, moderate concentric LVH, grade 2 diastolic dysfunction, severely dilated left atrium, mild TR.   Stress Testing: Lexiscan (Walking with mod Bruce)Tetrofosmin Stress Test  02/18/2020: Resting EKG/ECG demonstrated atypical atrial flutter with rapid ventricular response. Observed left ventricular hypertrophy with secondary ST-T changes, repolarization abnormality. Peak EKG/ECG revealed no significant ST-T change from baseline abnormality. Patient achieved 101% of MPHR with modified Bruce protocol.  During infusion the recovery ECG revealed occasional premature  There is a reversible mild defect in the distal anterior and apical regions.  Overall LV systolic function is normal with regional wall motion abnormalities in the same region. Stress LV EF: 55%.  Low risk in view of size of defect and preserved LVEF.    Coronary CTA w/ FFR.  03/07/2020:  1. Mild non-obstructive CAD of the proximal and mid-LAD and proximal LCx, CADRADS = 2. The mid-LAD stenosis may be more significant,however, there is blooming and alignment artifact. Will submit study for FFR, given the abnormal myoview results in the distal anterior and apical regions. 2. Coronary calcium score of 148. This was 82nd percentile for age and sex matched control. 3. Normal coronary origin with right dominance. 4. Dilated main pulmonary artery to 37 mm, suggesting pulmonary hypertension. 5.  Aortic atherosclerosis CT FFR ANALYSIS 1. CT FFR demonstrates flow-limiting stenosis of the proximal to mid-LAD, just upstream of the D1 branch, affecting the mid to distal LAD and a large D2 branch. LAD: Significant stenosis. Proximal FFR = 0.96, Mid FFR = 0.66, Distal FFR = 0.60  Heart Catheterization: None  LABORATORY DATA: BMP Latest Ref Rng & Units 02/29/2020 06/01/2014 04/12/2012  Glucose 65 - 99 mg/dL 124(H) 121(H) 107(H)  BUN 8 - 27 mg/dL 10 10 10   Creatinine 0.57 - 1.00 mg/dL 0.77 0.60 0.67   BUN/Creat Ratio 12 - 28 13 - -  Sodium 134 - 144 mmol/L 141 138 140  Potassium 3.5 - 5.2 mmol/L 4.6 3.6(L) 3.8  Chloride 96 - 106 mmol/L 104 100 102  CO2 20 - 29 mmol/L 22 21 25   Calcium 8.7 - 10.3 mg/dL 9.9 9.8 9.5   CBC Latest Ref Rng & Units 02/29/2020 06/01/2014 04/13/2012  WBC 3.4 - 10.8 x10E3/uL 8.6 8.9 -  Hemoglobin 11.1 - 15.9 g/dL 13.3 13.0 13.6  Hematocrit 34.0 - 46.6 % 39.8 38.6 -  Platelets 150 - 450 x10E3/uL 275 234 -   Lipid profile: 07/11/2019: Total cholesterol 203, triglycerides 208, HDL 56, LDL 113 01/07/2020: Total cholesterol 231, triglycerides 303, HDL 55, LDL 123  Hemoglobin A1c: 07/11/2019: 6.6 01/07/2020: 6.6  TSH:  01/01/2019 1.62  Serum creatinine: 01/07/2020 0.98 mg/dL  FINAL MEDICATION LIST END OF ENCOUNTER: No orders of the defined types were placed in this encounter.   No current facility-administered medications for this encounter.  IMPRESSION:  No diagnosis found.  RECOMMENDATIONS: [name] is a 69 y.o. female whose past medical history and cardiac risk factors include: Coronary artery disease, hypertension, hyperlipidemia, hypothyroidism, GERD, postmenopausal female, advanced age, non-insulin-dependent diabetes mellitus type 2, newly discovered paroxysmal atrial flutter during stress test.  Abnormal nuclear stress test.  There appears to be a reversible perfusion defect in the distribution of the LAD suggestive of underlying ischemia.  With coronary CTA with FFR analysis suggestive of flow-limiting stenosis in the proximal to mid LAD.  Symptomatically, patient does have effort related dyspnea which could be her anginal equivalent or secondary to elevated blood pressures.  We will start hydrochlorothiazide 12.5 mg p.o. daily.  This will help her systolic blood pressures and also facilitate diuresis as her echocardiogram noted grade 2 diastolic dysfunction with elevated left atrial pressures.    Check blood work prior to left heart  catheterization to reevaluate kidney function and electrolytes.    We will transition her from Lopressor to metoprolol succinate.    In the setting of diabetes mellitus, would recommend an LDL level less than or equal to 70 mg/dL.  Patient will follow up with her primary in regards to another repeat lipid profile.  The left heart catheterization procedure was explained to the patient in detail. The indication, alternatives, risks and benefits were reviewed. Complications including but not limited to bleeding, infection, acute kidney injury, blood transfusion, heart rhythm disturbances, contrast (dye) reaction, damage to the arteries or nerves in the legs or hands, cerebrovascular accident, myocardial infarction, need for emergent bypass surgery, blood clots in the legs, possible need for emergent blood transfusion, and rarely death were reviewed and discussed with the patient. The patient voices understanding and wishes to proceed.   Paroxysmal atrial flutter: . Rate control: Metoprolol succinate. Marland Kitchen Rhythm control: N/A. Marland Kitchen Thromboembolic prophylaxis: Eliquis.   . CHA2DS2-VASc SCORE is 5 which correlates to 6.7% risk of stroke per year.  . We emphasized the benefits, risks, and alternatives of anticoagulation.   Long-term oral anticoagulation:  Indication paroxysmal atrial flutter.  No prior history of intracranial bleeding or internal bleeding.  No recent surgeries.  Hemoglobin at the time [facility] was initiated was 13.3 g/dL.  Benign essential hypertension:  Currently being managed by primary team.  However, since the patient has an elevated left atrial pressure and effort related dyspnea.  Will starting hydrochlorothiazide 12.5 mg p.o. every morning.  Recommended to follow-up with primary team in regards to further blood pressure management.  Educated on the importance of a low-salt diet.  Mixed hyperlipidemia:  Continue pravastatin and Zetia.  Currently managed by primary  team.  Non-insulin-dependent diabetes mellitus type 2: Currently managed by primary team.  Obesity, due to excess calories: There is no height or weight on file to calculate BMI. . I reviewed with the patient the importance of diet, regular physical activity/exercise, weight loss.   . Patient is educated on increasing physical activity gradually as tolerated.  With the goal of moderate intensity exercise for 30 minutes a day 5 days a week.  No orders of the defined types were placed in this encounter.  --Continue cardiac medications as reconciled in final medication list. --No follow-ups on file. Or sooner if needed. --Continue follow-up with your primary care physician regarding the management of your other chronic comorbid conditions.  Patient's questions and concerns were addressed to her satisfaction. She voices understanding of the instructions provided during this encounter.   This note was created using a voice recognition software as a result there may  be grammatical errors inadvertently enclosed that do not reflect the nature of this encounter. Every attempt is made to correct such errors.  Rex Kras, [place], [facility]  Pager: [phone] Office: [phone]

## 2020-03-19 DIAGNOSIS — I25118 Atherosclerotic heart disease of native coronary artery with other forms of angina pectoris: Secondary | ICD-10-CM | POA: Diagnosis not present

## 2020-03-19 DIAGNOSIS — E782 Mixed hyperlipidemia: Secondary | ICD-10-CM | POA: Diagnosis not present

## 2020-03-19 DIAGNOSIS — Z9861 Coronary angioplasty status: Secondary | ICD-10-CM | POA: Diagnosis not present

## 2020-03-19 DIAGNOSIS — I4892 Unspecified atrial flutter: Secondary | ICD-10-CM | POA: Diagnosis not present

## 2020-03-19 DIAGNOSIS — G4730 Sleep apnea, unspecified: Secondary | ICD-10-CM | POA: Diagnosis not present

## 2020-03-19 DIAGNOSIS — I119 Hypertensive heart disease without heart failure: Secondary | ICD-10-CM | POA: Diagnosis not present

## 2020-03-19 DIAGNOSIS — I2584 Coronary atherosclerosis due to calcified coronary lesion: Secondary | ICD-10-CM | POA: Diagnosis not present

## 2020-03-19 DIAGNOSIS — Z955 Presence of coronary angioplasty implant and graft: Secondary | ICD-10-CM

## 2020-03-19 DIAGNOSIS — I2510 Atherosclerotic heart disease of native coronary artery without angina pectoris: Secondary | ICD-10-CM | POA: Diagnosis not present

## 2020-03-19 DIAGNOSIS — E039 Hypothyroidism, unspecified: Secondary | ICD-10-CM | POA: Diagnosis not present

## 2020-03-19 DIAGNOSIS — R7303 Prediabetes: Secondary | ICD-10-CM | POA: Diagnosis not present

## 2020-03-19 DIAGNOSIS — R9439 Abnormal result of other cardiovascular function study: Secondary | ICD-10-CM | POA: Diagnosis not present

## 2020-03-19 DIAGNOSIS — K219 Gastro-esophageal reflux disease without esophagitis: Secondary | ICD-10-CM | POA: Diagnosis not present

## 2020-03-19 DIAGNOSIS — R0600 Dyspnea, unspecified: Secondary | ICD-10-CM | POA: Diagnosis not present

## 2020-03-19 DIAGNOSIS — E6609 Other obesity due to excess calories: Secondary | ICD-10-CM | POA: Diagnosis not present

## 2020-03-19 DIAGNOSIS — R9389 Abnormal findings on diagnostic imaging of other specified body structures: Secondary | ICD-10-CM | POA: Diagnosis not present

## 2020-03-19 DIAGNOSIS — F329 Major depressive disorder, single episode, unspecified: Secondary | ICD-10-CM | POA: Diagnosis not present

## 2020-03-19 LAB — BASIC METABOLIC PANEL
Anion gap: 8 (ref 5–15)
BUN: 12 mg/dL (ref 8–23)
CO2: 22 mmol/L (ref 22–32)
Calcium: 8.9 mg/dL (ref 8.9–10.3)
Chloride: 107 mmol/L (ref 98–111)
Creatinine, Ser: 0.8 mg/dL (ref 0.44–1.00)
GFR calc Af Amer: >60 mL/min (ref >60)
GFR calc non Af Amer: >60 mL/min (ref >60)
Glucose, Bld: 132 mg/dL — ABNORMAL HIGH (ref 70–99)
Potassium: 3.9 mmol/L (ref 3.5–5.1)
Sodium: 137 mmol/L (ref 135–145)

## 2020-03-19 LAB — CBC
HCT: 37.4 % (ref 36.0–46.0)
Hemoglobin: 11.5 g/dL — ABNORMAL LOW (ref 12.0–15.0)
MCH: 30.9 pg (ref 26.0–34.0)
MCHC: 30.7 g/dL (ref 30.0–36.0)
MCV: 100.5 fL — ABNORMAL HIGH (ref 80.0–100.0)
Platelets: 199 10*3/uL (ref 150–400)
RBC: 3.72 MIL/uL — ABNORMAL LOW (ref 3.87–5.11)
RDW: 12.9 % (ref 11.5–15.5)
WBC: 7.7 10*3/uL (ref 4.0–10.5)
nRBC: 0 % (ref 0.0–0.2)

## 2020-03-19 LAB — GLUCOSE, CAPILLARY: Glucose-Capillary: 148 mg/dL — ABNORMAL HIGH (ref 70–99)

## 2020-03-19 MED FILL — ATORVASTATIN CALCIUM 80 MG: 80 | 30 days supply | Qty: 30 | Fill #0

## 2020-03-19 MED FILL — CLOPIDOGREL 75 MG TABLET: 75 | 30 days supply | Qty: 30 | Fill #0

## 2020-03-19 MED FILL — PANTOPRAZOLE SOD DR 40 MG T: 40 | 30 days supply | Qty: 30 | Fill #0

## 2020-03-19 NOTE — Discharge Summary (Signed)
Physician Discharge Summary  Patient ID: [name] MRN: [id number] DOB/AGE: 02/06/1951 69 y.o.  Admit date: 03/18/2020 Discharge date: 03/19/2020  Primary Discharge Diagnosis: Coronary artery disease  Secondary Discharge Diagnosis: Hypertension Prediabetes Paroxysmal atrial flutter Depression   Hospital Course:   69 y/o Caucasian female with hypertension, prediabetes, depression, paroxysmal atrial flutter, now with CAD and abnormal stress test and CTA FFR.  Patient underwent coronary angiography.  There is significant mismatch between reference vessel sizes of mid and prox LAD lesions, precluding use of one long stent, as it would be oversized distally, and undersized proximally. Thus, I placed one mid and one proximal stent. Diagonal 1 & 2 were wired and protected, but did not require angioplasty or intervention.  Uneventful hospital stay overnight.  Discharge today, with Plavix and Eliquis without aspirin in light of her ongoing anticoagulation paroxysmal atrial flutter.  Change pravastatin to atorvastatin 80 mg daily.  Change omeprazole to pantoprazole given ongoing use of Plavix.  Follow-up with Dr. Terri Skains on 03/28/2020.   Discharge Exam: Blood pressure 135/75, pulse 63, temperature 98.1 F (36.7 C), temperature source Oral, resp. rate 15, height 5\' 8"  (1.727 m), weight 111.6 kg, SpO2 98 %.   Physical Exam  Constitutional: She is oriented to person, place, and time. She appears well-developed and well-nourished. No distress.  HENT:  Head: Normocephalic and atraumatic.  Eyes: Pupils are equal, round, and reactive to light. Conjunctivae are normal.  Neck: No JVD present.  Cardiovascular: Normal rate, regular rhythm and intact distal pulses.  Pulmonary/Chest: Effort normal and breath sounds normal. She has no wheezes. She has no rales.  Abdominal: Soft. Bowel sounds are normal. There is no rebound.  Musculoskeletal:        General: No edema.  Lymphadenopathy:    She has no  cervical adenopathy.  Neurological: She is alert and oriented to person, place, and time. No cranial nerve deficit.  Skin: Skin is warm and dry.  Psychiatric: She has a normal mood and affect.  Nursing note and vitals reviewed.    Significant Diagnostic Studies:  EKG 03/19/2020: Sinus rhythm.  Nonspecific ST-T abnormalities, likely due to LVH.  No significant change compared to previous EKG.  Coronary intervention 03/18/2020: LM: Normal LAD: Prox 75-80% stenosis. Mild calcification.        Mid 75-80% stenosis. Mild calcification.         Prox D1 50% stenosis. LCx: Prox 20% stenosis. RCA: Prox 20% stenosis.       PTCA and stent placement 2.5 X 12 mm Resolute Onyx drug-eluting stent mid LAD     PTCA and stent placement 3.5 X 18 mm Resolute Onyx drug-eluting stent prox LAD  Labs:   Lab Results  Component Value Date   WBC 7.7 03/19/2020   HGB 11.5 (L) 03/19/2020   HCT 37.4 03/19/2020   MCV 100.5 (H) 03/19/2020   PLT 199 03/19/2020    Recent Labs  Lab 03/19/20 0630  NA 137  K 3.9  CL 107  CO2 22  BUN 12  CREATININE 0.80  CALCIUM 8.9  GLUCOSE 132*    Radiology: CARDIAC CATHETERIZATION  Addendum Date: 03/18/2020   LM: Normal LAD: Prox 75-80% stenosis. Mild calcification.        Mid 75-80% stenosis. Mild calcification.        Prox D1 50% stenosis. LCx: Prox 20% stenosis. RCA: Prox 20% stenosis.     PTCA and stent placement 2.5 X 12 mm Resolute Onyx drug-eluting stent mid LAD  PTCA and stent placement 3.5 X 18 mm Resolute Onyx drug-eluting stent prox LAD Nigel Mormon, MD [facility] [place] Pager: [phone] Office: [phone]    Result Date: 03/18/2020 LM: Normal LAD: Prox 75-80% stenosis. Mild calcification.        Mid 75-80% stenosis. Mild calcification.        Prox D1 50% stenosis. LCx: Prox 20% stenosis. RCA: Prox 20% stenosis.     PTCA and stent placement 2.5 X 12 mm Resolute Onyx drug-eluting stent mid LAD     PTCA and stent placement 3.5 X 18 mm  Resolute Onyx drug-eluting stent prox LAD Nigel Mormon, MD [facility] [place] Pager: [phone] Office: [phone]    CT CORONARY MORPH W/CTA COR W/SCORE W/CA W/CM &/OR WO/CM  Addendum Date: 03/08/2020   ADDENDUM REPORT: 03/08/2020 11:16 HISTORY: 69 yo female with dyspnea on exertion (DOE) abnormal nuclear stress test EXAM: Cardiac/Coronary CTA TECHNIQUE: The patient was scanned on a [employer]. PROTOCOL: A 120 kV prospective scan was triggered in the descending thoracic aorta at 111 HU's. Axial non-contrast 3 mm slices were carried out through the heart. The data set was analyzed on a dedicated work station and scored using the [place]. Gantry rotation speed was 250 msecs and collimation was .6 mm. Beta blockade and 0.8 mg of sl NTG was given. The 3D data set was reconstructed in 5% intervals of the 67-82 % of the R-R cycle. Diastolic phases were analyzed on a dedicated work station using MPR, MIP and VRT modes. The patient received 83mL OMNIPAQUE IOHEXOL 350 MG/ML SOLN of contrast. FINDINGS: Quality: Good (HR 72) Coronary calcium score: The patient's coronary artery calcium score is 148, which places the patient in the 82nd percentile. Coronary arteries: Normal coronary origins.  Right dominance. Right Coronary Artery: Dominant. Minimal mixed proximal and mid-RCA 1-24% stenosis (CADRADS1). No significant PLB or PDA disease. Left Main Coronary Artery: Normal. Bifurcates into the LAD and LCx arteries. Left Anterior Descending Coronary Artery: Moderate sized vessel which is does not reach the apex. There is mild 25-49% mixed proximal stenosis at the 1st septal perforator (CADRADS2). There is another mild to moderate mixed stenosis 25-49% at the D2 bifurcation with significant blooming/alignment artifact (this is a 2.25 vessel). Small <2 mm proximal D1 branch with minimal 1-24% ostial mixed stenosis (CADRADS1). Larger tortuous mid-vessel D2 branch without disease. Left  Circumflex Artery: Mild proximal 25-49% mixed stenosis. Large caliber proximal OM1 branch without disease. Smaller OM2 branch without disease. Aorta: Normal size, 31 mm at the mid ascending aorta (level of the PA bifurcation) measured double oblique. Aortic atherosclerosis. No dissection. Aortic Valve: Trileaflet.  No calcifications. Other findings: Normal pulmonary vein drainage into the left atrium. Normal left atrial appendage without a thrombus. Dilated main pulmonary artery to 37 mm, suggesting pulmonary hypertension. IMPRESSION: 1. Mild non-obstructive CAD of the proximal and mid-LAD and proximal LCx, CADRADS = 2. The mid-LAD stenosis may be more significant, however, there is blooming and alignment artifact. Will submit study for FFR, given the abnormal myoview results in the distal anterior and apical regions. 2. Coronary calcium score of 148. This was 82nd percentile for age and sex matched control. 3. Normal coronary origin with right dominance. 4. Dilated main pulmonary artery to 37 mm, suggesting pulmonary hypertension. 5.  Aortic atherosclerosis. Electronically Signed   By: Pixie Casino M.D.   On: 03/08/2020 11:16   Result Date: 03/08/2020 EXAM: OVER-READ INTERPRETATION  CT CHEST The following report is an over-read performed by  radiologist Dr. Abigail Miyamoto of [facility], [place] on 03/07/2020. This over-read does not include interpretation of cardiac or coronary anatomy or pathology. The coronary CTA interpretation by the cardiologist is attached. COMPARISON:  None. FINDINGS: Vascular: Aortic atherosclerosis. Pulmonary artery enlargement, outflow tract 3.4 cm. No central pulmonary embolism, on this non-dedicated study. Mediastinum/Nodes: No mediastinal or hilar adenopathy. Tiny hiatal hernia. Fluid level in the esophagus including on 19/12. Lungs/Pleura: No pleural fluid.  Clear imaged lungs. Upper Abdomen: Normal imaged portions of the liver, spleen. Musculoskeletal: No acute osseous  abnormality. IMPRESSION: 1.  No acute findings in the imaged extracardiac chest. 2.  Aortic Atherosclerosis (ICD10-I70.0). 3. Pulmonary artery enlargement suggests pulmonary arterial hypertension. 4. Tiny hiatal hernia. Esophageal air fluid level suggests dysmotility or gastroesophageal reflux. Electronically Signed: By: Abigail Miyamoto M.D. On: 03/07/2020 14:50   CT CORONARY FRACTIONAL FLOW RESERVE DATA PREP  Result Date: 03/08/2020 EXAM: CT FFR ANALYSIS CLINICAL DATA:  Dyspnea on exertion (DOE) FINDINGS: FFRct analysis was performed on the original cardiac CT angiogram dataset. Diagrammatic representation of the FFRct analysis is provided in a separate PDF document in PACS. This dictation was created using the PDF document and an interactive 3D model of the results. 3D model is not available in the EMR/PACS. Normal FFR range is >0.80. 1. Left Main: No significant stenosis. FFR = 0.98 2. LAD: Significant stenosis. Proximal FFR = 0.96, Mid FFR = 0.66, Distal FFR = 0.60 3. LCX: No significant stenosis. Proximal FFR = 0.94, Distal FFR = 0.87 4. RCA: No significant stenosis. Proximal FFR = 0.99, Mid FFR = 0.93, Distal FFR = 0.90 IMPRESSION: 1. CT FFR demonstrates flow-limiting stenosis of the proximal to mid-LAD, just upstream of the D1 branch, affecting the mid to distal LAD and a large D2 branch. 2. Consider cardiac catheterization and possible percutaneous intervention. Electronically Signed   By: Pixie Casino M.D.   On: 03/08/2020 18:49   PCV ECHOCARDIOGRAM COMPLETE  Result Date: 02/25/2020 Echocardiogram 02/22/2020: Normal LV systolic function with EF 59%. Left ventricle cavity is normal in size. Moderate concentric hypertrophy of the left ventricle. Cannot exclude apical variety of Hypertrophic cardiomyopathy.  Normal global wall motion. Doppler evidence of grade II (pseudonormal) diastolic dysfunction, elevated LAP. There is intraventricular pressure gradient of approximateky 25 mm Hg. due to cavitary  obliteration.  Calculated EF 59%. Left atrial cavity is severely dilated at 4.9 cm. Structurally normal tricuspid valve.  Mild tricuspid regurgitation. No evidence of pulmonary hypertension. Insignificant pericardial effusion.     FOLLOW UP PLANS AND APPOINTMENTS Discharge Instructions    Amb Referral to Cardiac Rehabilitation   Complete by: As directed    Diagnosis:  Coronary Stents PTCA     After initial evaluation and assessments completed: Virtual Based Care may be provided alone or in conjunction with Phase 2 Cardiac Rehab based on patient barriers.: Yes   Diet - low sodium heart healthy   Complete by: As directed    Increase activity slowly   Complete by: As directed      Allergies as of 03/19/2020      Reactions   Benazepril Cough   Ciprofloxacin Itching   Naproxen Sodium Other (See Comments)   Stomach burns      Medication List    STOP taking these medications   omeprazole 20 MG tablet Commonly known as: PRILOSEC OTC   pravastatin 80 MG tablet Commonly known as: PRAVACHOL     TAKE these medications   acetaminophen 500 MG tablet Commonly known as: TYLENOL  Take 1,000 mg by mouth every 6 (six) hours as needed for mild pain or headache.   amLODipine 10 MG tablet Commonly known as: NORVASC Take 10 mg by mouth daily.   apixaban 5 MG Tabs tablet Commonly known as: ELIQUIS Take 1 tablet (5 mg total) by mouth 2 (two) times daily.   atorvastatin 80 MG tablet Commonly known as: LIPITOR Take 1 tablet (80 mg total) by mouth daily.   clopidogrel 75 MG tablet Commonly known as: PLAVIX Take 1 tablet (75 mg total) by mouth daily with breakfast.   escitalopram 20 MG tablet Commonly known as: LEXAPRO Take 20 mg by mouth daily.   estradiol 1 MG tablet Commonly known as: ESTRACE Take 1 mg by mouth daily.   ezetimibe 10 MG tablet Commonly known as: ZETIA Take 1 tablet (10 mg total) by mouth daily.   hydrochlorothiazide 12.5 MG capsule Commonly known as:  MICROZIDE Take 1 capsule (12.5 mg total) by mouth in the morning.   irbesartan 300 MG tablet Commonly known as: AVAPRO Take 300 mg by mouth daily.   levothyroxine 100 MCG tablet Commonly known as: SYNTHROID Take 100 mcg by mouth at bedtime.   medroxyPROGESTERone 5 MG tablet Commonly known as: PROVERA Take 5 mg by mouth daily.   metoprolol succinate 50 MG 24 hr tablet Commonly known as: Toprol XL Take 1 tablet (50 mg total) by mouth in the morning. Hold if systolic blood pressure (top blood pressure number) less than 100 mmHg or heart rate less than 60 bpm (pulse). What changed: how much to take   pantoprazole 40 MG tablet Commonly known as: PROTONIX Take 1 tablet (40 mg total) by mouth daily.      Follow-up Information    Rex Kras, DO Follow up on 03/28/2020.   Specialties: Cardiology, Radiology, Vascular Surgery Why: 9:45 AM Contact information: [place] [address] [place], [place] [facility] Pager: [phone] Office: [phone] If no answer: [phone]

## 2020-03-19 NOTE — Progress Notes (Addendum)
CARDIAC REHAB PHASE I   PRE:  Rate/Rhythm: NSR 65  BP:  Sitting: 120/75       SaO2: 99% RA  MODE:  Ambulation: 470 ft  NSR 86/SaO2 98%   POST:  Rate/Rhythm: NSR 66  BP:  Sitting: 131/51        SaO2: 99% RA  0835 - 0936  Pt received in bed, agrees to ambulate. Gait belt placed on patient, but unneeded as patient ambulated with steady gait. No complaints during walk. Pt back to recliner, brake on and call bell/tray table at side. Provided education on self care S/P PCI/stenting x 2. Discussed wound care, s/s to report to MD, precautions and activity restrictions. Reviewed risk factors: HTN, DM II, Lipids, Stress. Provided handouts on low Na, heart healthy, and Diabetic diet. Stressed the importance of taking Asa/Plavix as prescribed. Reviewed use and storage of NTG. Provided stent card and encouraged to copy/carry. Reviewed activity/walking and dicussed temperature parameters and s/s to terminate exercise. Pt verbalized understanding of education provided. Discuss and will refer patient to [facility]; pt is very interested in participating.   Lesly Rubenstein, MS, ACSM EP-C, CCRP 03/19/2020  9:22 AM

## 2020-03-19 NOTE — Progress Notes (Signed)
Pt's stable, DC home via wheelchair 

## 2020-03-19 NOTE — Discharge Instructions (Signed)
Radial Site Care  This sheet gives you information about how to care for yourself after your procedure. Your health care provider may also give you more specific instructions. If you have problems or questions, contact your health care provider. What can I expect after the procedure? After the procedure, it is common to have:  Bruising and tenderness at the catheter insertion area. Follow these instructions at home: Medicines  Take over-the-counter and prescription medicines only as told by your health care provider. Insertion site care  Follow instructions from your health care provider about how to take care of your insertion site. Make sure you: ? Wash your hands with soap and water before you change your bandage (dressing). If soap and water are not available, use hand sanitizer. ? Change your dressing as told by your health care provider. ? Leave stitches (sutures), skin glue, or adhesive strips in place. These skin closures may need to stay in place for 2 weeks or longer. If adhesive strip edges start to loosen and curl up, you may trim the loose edges. [name] not remove adhesive strips completely unless your health care provider tells you to [name] that.  Check your insertion site every day for signs of infection. Check for: ? Redness, swelling, or pain. ? Fluid or blood. ? Pus or a bad smell. ? Warmth.  [name] not take baths, swim, or use a hot tub until your health care provider approves.  You may shower 24-48 hours after the procedure, or as directed by your health care provider. ? Remove the dressing and gently wash the site with plain soap and water. ? Pat the area dry with a clean towel. ? [name] not rub the site. That could cause bleeding.  [name] not apply powder or lotion to the site. Activity   For 24 hours after the procedure, or as directed by your health care provider: ? [name] not flex or bend the affected arm. ? [name] not push or pull heavy objects with the affected arm. ? [name] not  drive yourself home from the hospital or clinic. You may drive 24 hours after the procedure unless your health care provider tells you not to. ? [name] not operate machinery or power tools.  [name] not lift anything that is heavier than 10 lb (4.5 kg), or the limit that you are told, until your health care provider says that it is safe.  Ask your health care provider when it is okay to: ? Return to work or school. ? Resume usual physical activities or sports. ? Resume sexual activity. General instructions  If the catheter site starts to bleed, raise your arm and put firm pressure on the site. If the bleeding does not stop, get help right away. This is a medical emergency.  If you went home on the same day as your procedure, a responsible adult should be with you for the first 24 hours after you arrive home.  Keep all follow-up visits as told by your health care provider. This is important. Contact a health care provider if:  You have a fever.  You have redness, swelling, or yellow drainage around your insertion site. Get help right away if:  You have unusual pain at the radial site.  The catheter insertion area swells very fast.  The insertion area is bleeding, and the bleeding does not stop when you hold steady pressure on the area.  Your arm or hand becomes pale, cool, tingly, or numb. These symptoms may represent a serious problem   that is an emergency. [name] not wait to see if the symptoms will go away. Get medical help right away. Call your local emergency services (911 in the [place] [name] not drive yourself to the hospital. Summary  After the procedure, it is common to have bruising and tenderness at the site.  Follow instructions from your health care provider about how to take care of your radial site wound. Check the wound every day for signs of infection.  [name] not lift anything that is heavier than 10 lb (4.5 kg), or the limit that you are told, until your health care provider says  that it is safe. This information is not intended to replace advice given to you by your health care provider. Make sure you discuss any questions you have with your health care provider. Document Revised: 11/09/2017 Document Reviewed: 11/09/2017 Elsevier Patient Education  2020 [employer]  

## 2020-03-26 ENCOUNTER — Ambulatory Visit: Payer: PPO | Admitting: Cardiology

## 2020-03-26 ENCOUNTER — Other Ambulatory Visit: Payer: Self-pay

## 2020-03-26 ENCOUNTER — Encounter: Payer: Self-pay | Admitting: Cardiology

## 2020-03-26 VITALS — BP 127/62 | HR 68 | Ht 68.0 in | Wt 249.0 lb

## 2020-03-26 DIAGNOSIS — E119 Type 2 diabetes mellitus without complications: Secondary | ICD-10-CM | POA: Diagnosis not present

## 2020-03-26 DIAGNOSIS — Z6838 Body mass index (BMI) 38.0-38.9, adult: Secondary | ICD-10-CM | POA: Diagnosis not present

## 2020-03-26 DIAGNOSIS — Z955 Presence of coronary angioplasty implant and graft: Secondary | ICD-10-CM | POA: Diagnosis not present

## 2020-03-26 DIAGNOSIS — R9439 Abnormal result of other cardiovascular function study: Secondary | ICD-10-CM

## 2020-03-26 DIAGNOSIS — E782 Mixed hyperlipidemia: Secondary | ICD-10-CM

## 2020-03-26 DIAGNOSIS — I2510 Atherosclerotic heart disease of native coronary artery without angina pectoris: Secondary | ICD-10-CM

## 2020-03-26 DIAGNOSIS — E039 Hypothyroidism, unspecified: Secondary | ICD-10-CM | POA: Diagnosis not present

## 2020-03-26 DIAGNOSIS — Z7901 Long term (current) use of anticoagulants: Secondary | ICD-10-CM | POA: Diagnosis not present

## 2020-03-26 DIAGNOSIS — I4892 Unspecified atrial flutter: Secondary | ICD-10-CM

## 2020-03-26 DIAGNOSIS — I10 Essential (primary) hypertension: Secondary | ICD-10-CM

## 2020-03-26 NOTE — Progress Notes (Signed)
[name] Date of Birth: 09/23/1951 MRN: [id number] Primary Care Provider:[place], [place] Reader, [place] Primary Cardiologist: Rex Kras, DO  (established care 02/04/2020).   Date: 03/26/20 Last Office Visit: 02/29/2020  Chief Complaint  Patient presents with  . Follow-up    Post heart catheterization    HPI  [name] is a 69 y.o. female who presents to the office with a chief complaint of " post heart catheterization follow-up." Patient's past medical history and cardiac risk factors include: Coronary artery disease status post angioplasty and stent, hypertension, hyperlipidemia, hypothyroidism, GERD, postmenopausal female, advanced age, non-insulin-dependent diabetes mellitus type 2, Paroxysmal atrial flutter during stress test.  Patient is accompanied by her son Shanon Brow at today's office visit.  Patient was referred to the office in April 2021 at the request of her primary care provider for evaluation of abnormal EKG and worsening cardiovascular risk factors.  Patient has undergone extensive evaluation given her symptoms cardiovascular risk factors.  Her stress test was reported to be abnormal with reversible ischemia in the LAD distribution.  And coronary CTA was CT FFR was also reported to be abnormal in the LAD distribution.  She is recommended to undergo invasive angiography.  She underwent left heart catheterization with possible intervention on March 18, 2020.  She had obstructive CAD in the LAD distribution and underwent PTCA and stent placement in the mid and proximal LAD distribution.  Report noted below for further reference.    Post procedure patient states that she has been doing well.  She does have residual effort related dyspnea which appears to be chronic and stable.  Her arteriotomy site is healing well and no bruits appreciated.  Denies prior history of myocardial infarction, congestive heart failure, deep venous thrombosis, pulmonary embolism, stroke, transient  ischemic attack.  FUNCTIONAL STATUS: She tries to walk at least 20 minutes a day as weather permitting.  ALLERGIES: Allergies  Allergen Reactions  . Benazepril Cough  . Ciprofloxacin Itching  . Naproxen Sodium Other (See Comments)    Stomach burns    MEDICATION LIST PRIOR TO VISIT: Current Outpatient Medications on File Prior to Visit  Medication Sig Dispense Refill  . acetaminophen (TYLENOL) 500 MG tablet Take 1,000 mg by mouth every 6 (six) hours as needed for mild pain or headache.    Marland Kitchen amLODipine (NORVASC) 10 MG tablet Take 10 mg by mouth daily.     Marland Kitchen apixaban (ELIQUIS) 5 MG TABS tablet Take 1 tablet (5 mg total) by mouth 2 (two) times daily. 60 tablet 0  . atorvastatin (LIPITOR) 80 MG tablet Take 1 tablet (80 mg total) by mouth daily. 30 tablet 1  . clopidogrel (PLAVIX) 75 MG tablet Take 1 tablet (75 mg total) by mouth daily with breakfast. 30 tablet 1  . escitalopram (LEXAPRO) 20 MG tablet Take 20 mg by mouth daily.    Marland Kitchen estradiol (ESTRACE) 1 MG tablet Take 1 mg by mouth daily.     . hydrochlorothiazide (MICROZIDE) 12.5 MG capsule Take 1 capsule (12.5 mg total) by mouth in the morning. 90 capsule 0  . ibuprofen (ADVIL) 200 MG tablet Take 200 mg by mouth every 6 (six) hours as needed.    . irbesartan (AVAPRO) 300 MG tablet Take 300 mg by mouth daily.    Marland Kitchen levothyroxine (SYNTHROID, LEVOTHROID) 100 MCG tablet Take 100 mcg by mouth at bedtime.     . medroxyPROGESTERone (PROVERA) 5 MG tablet Take 5 mg by mouth daily.    . metoprolol succinate (TOPROL XL)  50 MG 24 hr tablet Take 1 tablet (50 mg total) by mouth in the morning. Hold if systolic blood pressure (top blood pressure number) less than 100 mmHg or heart rate less than 60 bpm (pulse). (Patient taking differently: Take 100 mg by mouth in the morning. Hold if systolic blood pressure (top blood pressure number) less than 100 mmHg or heart rate less than 60 bpm (pulse).) 90 tablet 0  . pantoprazole (PROTONIX) 40 MG tablet Take 1  tablet (40 mg total) by mouth daily. 30 tablet 1   No current facility-administered medications on file prior to visit.    PAST MEDICAL HISTORY: Past Medical History:  Diagnosis Date  . Arthritis   . Coronary artery disease   . Depression   . Diabetes mellitus without complication ([place])   . GERD (gastroesophageal reflux disease)   . Hypercholesteremia   . Hypertension   . Hypothyroidism   . Pre-diabetes 08/19/2019  . Sleep apnea    mild-modreate-tried cpap    PAST SURGICAL HISTORY: Past Surgical History:  Procedure Laterality Date  . CARPAL TUNNEL RELEASE     right  . COLONOSCOPY    . CORONARY ANGIOPLASTY WITH STENT PLACEMENT    . CORONARY STENT INTERVENTION N/A 03/18/2020   Procedure: CORONARY STENT INTERVENTION;  Surgeon: Nigel Mormon, MD;  Location: [facility];  Service: Cardiovascular;  Laterality: N/A;  . DILATION AND CURETTAGE OF UTERUS    . INTRAVASCULAR ULTRASOUND/IVUS N/A 03/18/2020   Procedure: Intravascular Ultrasound/IVUS;  Surgeon: Nigel Mormon, MD;  Location: [facility];  Service: Cardiovascular;  Laterality: N/A;  . LEFT HEART CATH AND CORONARY ANGIOGRAPHY N/A 03/18/2020   Procedure: LEFT HEART CATH AND CORONARY ANGIOGRAPHY;  Surgeon: Nigel Mormon, MD;  Location: [facility];  Service: Cardiovascular;  Laterality: N/A;  . ORIF ANKLE FRACTURE  04/13/2012   Procedure: OPEN REDUCTION INTERNAL FIXATION (ORIF) ANKLE FRACTURE;  Surgeon: Wylene Simmer, MD;  Location: [facility];  Service: Orthopedics;  Laterality: Left;  . TONSILLECTOMY      FAMILY HISTORY: The patient family history includes Breast cancer in her sister; Colon cancer in her father; Dementia in her mother; Heart attack in her father; Hypertension in her sister, sister, sister, and sister.   SOCIAL HISTORY:  The patient  reports that she has never smoked. She has never used smokeless tobacco. She reports current alcohol use. She reports that she  does not use drugs.  REVIEW OF SYSTEMS: Review of Systems  Constitution: Negative for chills, fever and malaise/fatigue.  HENT: Negative for ear discharge, ear pain and nosebleeds.   Eyes: Negative for blurred vision and discharge.  Cardiovascular: Positive for dyspnea on exertion and palpitations. Negative for chest pain, claudication, leg swelling, near-syncope, orthopnea, paroxysmal nocturnal dyspnea and syncope.  Respiratory: Negative for cough and shortness of breath.   Endocrine: Negative for polydipsia, polyphagia and polyuria.  Hematologic/Lymphatic: Negative for bleeding problem.  Skin: Negative for flushing and nail changes.  Musculoskeletal: Negative for muscle cramps, muscle weakness and myalgias.  Gastrointestinal: Negative for abdominal pain, dysphagia, hematemesis, hematochezia, melena, nausea and vomiting.  Neurological: Negative for dizziness, focal weakness and light-headedness.   PHYSICAL EXAM: Vitals with BMI 03/26/2020 03/19/2020 03/18/2020  Height 5\' 8"  - -  Weight 249 lbs - -  BMI 85.27 - -  Systolic 782 423 536  Diastolic 62 75 57  Pulse 68 63 61    CONSTITUTIONAL: Well-developed and well-nourished. No acute distress.  SKIN: Skin is warm  and dry. No rash noted. No cyanosis. No pallor. No jaundice HEAD: Normocephalic and atraumatic.  EYES: No scleral icterus MOUTH/THROAT: Moist oral membranes.  NECK: No JVD present. No thyromegaly noted. No carotid bruits  LYMPHATIC: No visible cervical adenopathy.  CHEST Normal respiratory effort. No intercostal retractions  LUNGS: Clear to auscultation bilaterally.  No stridor. No wheezes. No rales.  CARDIOVASCULAR: Regular rate and rhythm, positive S1-S2, no murmurs rubs or gallops appreciated.   ABDOMINAL: Obese, soft, nontender, nondistended, positive bowel sounds in all 4 quadrants. No apparent ascites.  EXTREMITIES: No peripheral edema. Radial site is healing well without hematoma / bruit.   HEMATOLOGIC: No significant  bruising NEUROLOGIC: Oriented to person, place, and time. Nonfocal. Normal muscle tone.  PSYCHIATRIC: Normal mood and affect. Normal behavior. Cooperative  CARDIAC DATABASE: EKG: 03/26/2020: Normal sinus rhythm, 63 bpm, normal axis, LVH per voltage criteria, ST-T changes most likely secondary to underlying LVH.  No significant change compared to prior EKG  Echocardiogram: 02/22/2020: LVEF 59%, moderate concentric LVH, grade 2 diastolic dysfunction, severely dilated left atrium, mild TR.   Stress Testing: Lexiscan (Walking with mod Bruce)Tetrofosmin Stress Test  02/18/2020: Resting EKG/ECG demonstrated atypical atrial flutter with rapid ventricular response. Observed left ventricular hypertrophy with secondary ST-T changes, repolarization abnormality. Peak EKG/ECG revealed no significant ST-T change from baseline abnormality. Patient achieved 101% of MPHR with modified Bruce protocol.  During infusion the recovery ECG revealed occasional premature  There is a reversible mild defect in the distal anterior and apical regions.  Overall LV systolic function is normal with regional wall motion abnormalities in the same region. Stress LV EF: 55%.  Low risk in view of size of defect and preserved LVEF.    Coronary CTA w/ FFR.  03/07/2020:  1. Mild non-obstructive CAD of the proximal and mid-LAD and proximal LCx, CADRADS = 2. The mid-LAD stenosis may be more significant,however, there is blooming and alignment artifact. Will submit study for FFR, given the abnormal myoview results in the distal anterior and apical regions. 2. Coronary calcium score of 148. This was 82nd percentile for age and sex matched control. 3. Normal coronary origin with right dominance. 4. Dilated main pulmonary artery to 37 mm, suggesting pulmonary hypertension. 5.  Aortic atherosclerosis CT FFR ANALYSIS 1. CT FFR demonstrates flow-limiting stenosis of the proximal to mid-LAD, just upstream of the D1 branch, affecting the  mid to distal LAD and a large D2 branch. LAD: Significant stenosis. Proximal FFR = 0.96, Mid FFR = 0.66, Distal FFR = 0.60  Heart Catheterization: March 18, 2020: LM: Normal LAD: Prox 75-80% stenosis. Mild calcification.        Mid 75-80% stenosis. Mild calcification.        Prox D1 50% stenosis. LCx: Prox 20% stenosis. RCA: Prox 20% stenosis.       PTCA and stent placement 2.5 X 12 mm Resolute Onyx drug-eluting stent mid LAD     PTCA and stent placement 3.5 X 18 mm Resolute Onyx drug-eluting stent prox LAD  LABORATORY DATA: BMP Latest Ref Rng & Units 03/19/2020 02/29/2020 06/01/2014  Glucose 70 - 99 mg/dL 132(H) 124(H) 121(H)  BUN 8 - 23 mg/dL 12 10 10   Creatinine 0.44 - 1.00 mg/dL 0.80 0.77 0.60  BUN/Creat Ratio 12 - 28 - 13 -  Sodium 135 - 145 mmol/L 137 141 138  Potassium 3.5 - 5.1 mmol/L 3.9 4.6 3.6(L)  Chloride 98 - 111 mmol/L 107 104 100  CO2 22 - 32 mmol/L 22 22 21   Calcium  8.9 - 10.3 mg/dL 8.9 9.9 9.8   CBC Latest Ref Rng & Units 03/19/2020 02/29/2020 06/01/2014  WBC 4.0 - 10.5 K/uL 7.7 8.6 8.9  Hemoglobin 12.0 - 15.0 g/dL 11.5(L) 13.3 13.0  Hematocrit 36.0 - 46.0 % 37.4 39.8 38.6  Platelets 150 - 400 K/uL 199 275 234   Lipid profile: 07/11/2019: Total cholesterol 203, triglycerides 208, HDL 56, LDL 113 01/07/2020: Total cholesterol 231, triglycerides 303, HDL 55, LDL 123  Hemoglobin A1c: 07/11/2019: 6.6 01/07/2020: 6.6  TSH:  01/01/2019 1.62  Serum creatinine: 01/07/2020 0.98 mg/dL  FINAL MEDICATION LIST END OF ENCOUNTER: No orders of the defined types were placed in this encounter.    Current Outpatient Medications:  .  acetaminophen (TYLENOL) 500 MG tablet, Take 1,000 mg by mouth every 6 (six) hours as needed for mild pain or headache., Disp: , Rfl:  .  amLODipine (NORVASC) 10 MG tablet, Take 10 mg by mouth daily. , Disp: , Rfl:  .  apixaban (ELIQUIS) 5 MG TABS tablet, Take 1 tablet (5 mg total) by mouth 2 (two) times daily., Disp: 60 tablet, Rfl: 0 .  atorvastatin  (LIPITOR) 80 MG tablet, Take 1 tablet (80 mg total) by mouth daily., Disp: 30 tablet, Rfl: 1 .  clopidogrel (PLAVIX) 75 MG tablet, Take 1 tablet (75 mg total) by mouth daily with breakfast., Disp: 30 tablet, Rfl: 1 .  escitalopram (LEXAPRO) 20 MG tablet, Take 20 mg by mouth daily., Disp: , Rfl:  .  estradiol (ESTRACE) 1 MG tablet, Take 1 mg by mouth daily. , Disp: , Rfl:  .  hydrochlorothiazide (MICROZIDE) 12.5 MG capsule, Take 1 capsule (12.5 mg total) by mouth in the morning., Disp: 90 capsule, Rfl: 0 .  ibuprofen (ADVIL) 200 MG tablet, Take 200 mg by mouth every 6 (six) hours as needed., Disp: , Rfl:  .  irbesartan (AVAPRO) 300 MG tablet, Take 300 mg by mouth daily., Disp: , Rfl:  .  levothyroxine (SYNTHROID, LEVOTHROID) 100 MCG tablet, Take 100 mcg by mouth at bedtime. , Disp: , Rfl:  .  medroxyPROGESTERone (PROVERA) 5 MG tablet, Take 5 mg by mouth daily., Disp: , Rfl:  .  metoprolol succinate (TOPROL XL) 50 MG 24 hr tablet, Take 1 tablet (50 mg total) by mouth in the morning. Hold if systolic blood pressure (top blood pressure number) less than 100 mmHg or heart rate less than 60 bpm (pulse). (Patient taking differently: Take 100 mg by mouth in the morning. Hold if systolic blood pressure (top blood pressure number) less than 100 mmHg or heart rate less than 60 bpm (pulse).), Disp: 90 tablet, Rfl: 0 .  pantoprazole (PROTONIX) 40 MG tablet, Take 1 tablet (40 mg total) by mouth daily., Disp: 30 tablet, Rfl: 1  IMPRESSION:    ICD-10-CM   1. Essential hypertension  I10 EKG 12-Lead    Magnesium    Basic metabolic panel    Basic Metabolic Panel (BMET)  2. Atherosclerosis of native coronary artery of native heart without angina pectoris  I25.10 AMB referral to cardiac rehabilitation  3. History of coronary angioplasty with insertion of stent  Z95.5 AMB referral to cardiac rehabilitation  4. Paroxysmal atrial flutter (HCC)  I48.92 CBC  5. Long term (current) use of anticoagulants  Z79.01   6.  Type 2 diabetes mellitus without complication, without long-term current use of insulin (HCC)  E11.9   7. Mixed hyperlipidemia  E78.2 Lipid Panel With LDL/HDL Ratio    Lipid Panel With LDL/HDL Ratio  8.  Abnormal nuclear stress test  R94.39   9. Hypothyroidism, unspecified type  E03.9   10. Class 2 severe obesity due to excess calories with serious comorbidity and body mass index (BMI) of 38.0 to 38.9 in adult [facility])  E66.01    Z68.38      RECOMMENDATIONS: DESA RECH is a 69 y.o. female whose past medical history and cardiac risk factors include: Coronary artery disease status post angioplasty and stent to the proximal and mid LAD, hypertension, hyperlipidemia, hypothyroidism, GERD, postmenopausal female, advanced age, non-insulin-dependent diabetes mellitus type 2, paroxysmal atrial flutter during stress test.  Atherosclerosis of the native coronary arteries without angina pectoris and prior angioplasty and stenting:  Left heart catheterization results reviewed with the patient and her son at today's visit.  Arteriotomy site is healing well without any post procedure complications.  Continue clopidogrel.  Not on dual antiplatelet therapy as she is on Eliquis for paroxysmal atrial flutter.  Continue beta-blocker therapy  Continue ARB therapy  Continue high intensity statin  Cardiac rehab referral provided  We will check lipid profile prior to the next office visit  Paroxysmal atrial flutter: . Rate control: Metoprolol succinate. Marland Kitchen Rhythm control: N/A. Marland Kitchen Thromboembolic prophylaxis: Eliquis.   . CHA2DS2-VASc SCORE is 5 which correlates to 6.7% risk of stroke per year.  . Reemphasized the benefits, risks, and alternatives of anticoagulation.   Long-term oral anticoagulation:  Indication paroxysmal atrial flutter.  No prior history of intracranial bleeding or internal bleeding.  No recent surgeries.  Hemoglobin at the time [facility] was initiated was 13.3 g/dL.  Benign essential  hypertension: . Office blood pressure is at goal.  . Medication reconciled.  . Low salt diet recommended. A diet that is rich in fruits, vegetables, legumes, and low-fat dairy products and low in snacks, sweets, and meats (such as the Dietary Approaches to Stop Hypertension [DASH] diet).   Mixed hyperlipidemia:  Continue Lipitor.  Currently managed by primary team.  Non-insulin-dependent diabetes mellitus type 2: Currently managed by primary team.  Obesity, due to excess calories: Body mass index is 37.86 kg/m. . I reviewed with the patient the importance of diet, regular physical activity/exercise, weight loss.   . Patient is educated on increasing physical activity gradually as tolerated.  With the goal of moderate intensity exercise for 30 minutes a day 5 days a week.  Orders Placed This Encounter  Procedures  . Lipid Panel With LDL/HDL Ratio  . AMB referral to cardiac rehabilitation  . EKG 12-Lead   --Continue cardiac medications as reconciled in final medication list. --Return in about 3 months (around 06/26/2020). Or sooner if needed. --Continue follow-up with your primary care physician regarding the management of your other chronic comorbid conditions.  Patient's questions and concerns were addressed to her satisfaction. She voices understanding of the instructions provided during this encounter.   This note was created using a voice recognition software as a result there may be grammatical errors inadvertently enclosed that do not reflect the nature of this encounter. Every attempt is made to correct such errors.  Rex Kras, [place], [facility]  Pager: [phone] Office: [phone]

## 2020-03-28 ENCOUNTER — Ambulatory Visit: Payer: PPO | Admitting: Cardiology

## 2020-03-28 ENCOUNTER — Telehealth (HOSPITAL_COMMUNITY): Payer: Self-pay

## 2020-03-28 NOTE — Telephone Encounter (Signed)
Called patient to see if she was interested in participating in the Cardiac Rehab Program. Patient stated not at this time b/c she is due to have eye surgery.  Closed referral

## 2020-04-05 ENCOUNTER — Other Ambulatory Visit: Payer: Self-pay | Admitting: Cardiology

## 2020-04-05 DIAGNOSIS — I4892 Unspecified atrial flutter: Secondary | ICD-10-CM

## 2020-04-07 ENCOUNTER — Other Ambulatory Visit: Payer: Self-pay

## 2020-04-07 DIAGNOSIS — I4892 Unspecified atrial flutter: Secondary | ICD-10-CM

## 2020-04-07 MED ORDER — APIXABAN 5 MG PO TABS: 5.0000 mg | ORAL_TABLET | Freq: Two times a day (BID) | ORAL | 1 refills | Status: DC

## 2020-04-23 DIAGNOSIS — H25812 Combined forms of age-related cataract, left eye: Secondary | ICD-10-CM | POA: Diagnosis not present

## 2020-04-23 DIAGNOSIS — H2512 Age-related nuclear cataract, left eye: Secondary | ICD-10-CM | POA: Diagnosis not present

## 2020-04-29 DIAGNOSIS — H25011 Cortical age-related cataract, right eye: Secondary | ICD-10-CM | POA: Diagnosis not present

## 2020-04-29 DIAGNOSIS — H2511 Age-related nuclear cataract, right eye: Secondary | ICD-10-CM | POA: Diagnosis not present

## 2020-04-29 DIAGNOSIS — H25041 Posterior subcapsular polar age-related cataract, right eye: Secondary | ICD-10-CM | POA: Diagnosis not present

## 2020-05-07 DIAGNOSIS — H2511 Age-related nuclear cataract, right eye: Secondary | ICD-10-CM | POA: Diagnosis not present

## 2020-05-07 DIAGNOSIS — H25041 Posterior subcapsular polar age-related cataract, right eye: Secondary | ICD-10-CM | POA: Diagnosis not present

## 2020-05-07 DIAGNOSIS — H25011 Cortical age-related cataract, right eye: Secondary | ICD-10-CM | POA: Diagnosis not present

## 2020-05-20 ENCOUNTER — Other Ambulatory Visit: Payer: Self-pay

## 2020-05-20 MED ORDER — PANTOPRAZOLE SODIUM 40 MG PO TBEC: 40.0000 mg | DELAYED_RELEASE_TABLET | Freq: Every day | ORAL | 1 refills | Status: DC

## 2020-05-20 MED ORDER — CLOPIDOGREL BISULFATE 75 MG PO TABS: 75.0000 mg | ORAL_TABLET | Freq: Every day | ORAL | 1 refills | Status: DC

## 2020-05-20 MED ORDER — ATORVASTATIN CALCIUM 80 MG PO TABS: 80.0000 mg | ORAL_TABLET | Freq: Every day | ORAL | 1 refills | Status: DC

## 2020-05-28 ENCOUNTER — Telehealth (HOSPITAL_COMMUNITY): Payer: Self-pay

## 2020-05-28 ENCOUNTER — Encounter (HOSPITAL_COMMUNITY): Payer: Self-pay

## 2020-05-28 NOTE — Telephone Encounter (Signed)
**  UPDATED**  Pt insurance is active and benefits verified through [employer]. Co-pay $15.00, DED $0.00/$0.00 met, out of pocket $3,400.00/$1,136.42 met, co-insurance 0%. No pre-authorization required. Nick/HTA, 05/28/20 @ 957, NOP#[id number]

## 2020-05-28 NOTE — Telephone Encounter (Signed)
Pt returned CR phone call and stated she is interested in CR. She will come in for orientation on 06/12/20 @ 2PM and will attend the 145PM class.  Mailed letter

## 2020-05-28 NOTE — Telephone Encounter (Signed)
Attempted to call patient in regards to Cardiac Rehab - LM on VM Mailed letter 

## 2020-06-03 ENCOUNTER — Telehealth (HOSPITAL_COMMUNITY): Payer: Self-pay | Admitting: Student-PharmD

## 2020-06-09 ENCOUNTER — Other Ambulatory Visit: Payer: Self-pay | Admitting: Cardiology

## 2020-06-09 DIAGNOSIS — I10 Essential (primary) hypertension: Secondary | ICD-10-CM

## 2020-06-09 NOTE — Telephone Encounter (Signed)
Cardiac Rehab Medication Review by a Pharmacist  Does the patient  feel that his/her medications are working for him/her?  yes  Has the patient been experiencing any side effects to the medications prescribed?  no  Does the patient measure his/her own blood pressure or blood glucose at home?  yes - occasionally  130/75, 149/65 HR 60-70  Does the patient have any problems obtaining medications due to transportation or finances?   no  Understanding of regimen: good Understanding of indications: good Potential of compliance: good   Mercy Riding, PharmD PGY1 Acute Care Pharmacy Resident [phone]

## 2020-06-10 ENCOUNTER — Telehealth (HOSPITAL_COMMUNITY): Payer: Self-pay

## 2020-06-10 NOTE — Telephone Encounter (Signed)
Cardiac Rehab Note:  Unsuccessful telephone call to Henreitta Cea per patients request to complete pre-orientation health assessment. [employer] compliant VM message left requesting call back to [phone].  Omarii Scalzo E. Rollene Rotunda RN, BSN [place]. [facility]  Cardiac and Pulmonary Rehabilitation Phone: [phone] Fax: [phone]

## 2020-06-10 NOTE — Telephone Encounter (Signed)
Cardiac Rehab Telephone Note:  Successful telephone encounter to [name] to confirm Cardiac Rehab orientation appointment for 06/12/20 at 2:00. Unfortunately nursing assessment could not be completed at this time secondary to patient at work. Patient requested call back today after 3:15 pm. Will follow up with patient at that time per her request.  Amy Nicklas E. Rollene Rotunda RN, BSN [place]. [facility]  Cardiac and Pulmonary Rehabilitation Phone: [phone] Fax: [phone]

## 2020-06-11 ENCOUNTER — Encounter (HOSPITAL_COMMUNITY)
Admission: RE | Admit: 2020-06-11 | Discharge: 2020-06-11 | Disposition: A | Discharge status: Home / Self Care | Payer: PPO | Source: Ambulatory Visit | Attending: Cardiology | Admitting: Cardiology

## 2020-06-11 ENCOUNTER — Telehealth (HOSPITAL_COMMUNITY): Payer: Self-pay | Admitting: *Deleted

## 2020-06-11 ENCOUNTER — Other Ambulatory Visit: Payer: Self-pay

## 2020-06-11 DIAGNOSIS — Z955 Presence of coronary angioplasty implant and graft: Secondary | ICD-10-CM | POA: Insufficient documentation

## 2020-06-11 NOTE — Progress Notes (Signed)
Cardiac Rehab Telephone Note:  Successful telephone encounter to [name] to confirm Cardiac Rehab orientation appointment for 06/12/20 at 2:00 pm. Nursing assessment completed. Patient questions answered. Instructions for appointment provided. Patient screening for Covid-19 negative.  Amy Prisco E. Rollene Rotunda RN, BSN [place]. [facility]  Cardiac and Pulmonary Rehabilitation Phone: [phone] Fax: [phone]

## 2020-06-11 NOTE — Telephone Encounter (Signed)
Left message to call cardiac rehab regarding. Cardiac rehab orientation.Barnet Pall, RN,BSN 06/11/2020 2:13 PM

## 2020-06-12 ENCOUNTER — Other Ambulatory Visit: Payer: Self-pay

## 2020-06-12 ENCOUNTER — Encounter (HOSPITAL_COMMUNITY): Payer: Self-pay

## 2020-06-12 ENCOUNTER — Encounter (HOSPITAL_COMMUNITY)
Admission: RE | Admit: 2020-06-12 | Discharge: 2020-06-12 | Disposition: A | Discharge status: Home / Self Care | Payer: PPO | Source: Ambulatory Visit | Attending: Cardiology | Admitting: Cardiology

## 2020-06-12 VITALS — BP 118/74 | HR 75 | Ht 67.5 in | Wt 255.1 lb

## 2020-06-12 DIAGNOSIS — Z955 Presence of coronary angioplasty implant and graft: Secondary | ICD-10-CM

## 2020-06-12 DIAGNOSIS — I4892 Unspecified atrial flutter: Secondary | ICD-10-CM

## 2020-06-12 MED ORDER — METOPROLOL SUCCINATE ER 50 MG PO TB24: 50.0000 mg | ORAL_TABLET | Freq: Every morning | ORAL | 1 refills | Status: DC

## 2020-06-12 NOTE — Progress Notes (Signed)
Cardiac Individual Treatment Plan  Patient Details  Name: [name] MRN: [id number] Date of Birth: 09-06-1951 Referring Provider:     [place] from 06/12/2020 in [place]  Referring Provider Rex Kras DO      Initial Encounter Date:    CARDIAC REHAB PHASE II ORIENTATION from 06/12/2020 in [place]  Date 06/12/20      Visit Diagnosis: S/P DES x 2 LAD 03/18/20  Patient's Home Medications on Admission:  Current Outpatient Medications:  .  acetaminophen (TYLENOL) 500 MG tablet, Take 1,000 mg by mouth every 6 (six) hours as needed for mild pain or headache., Disp: , Rfl:  .  amLODipine (NORVASC) 10 MG tablet, Take 10 mg by mouth daily. , Disp: , Rfl:  .  apixaban (ELIQUIS) 5 MG TABS tablet, Take 5 mg by mouth 2 (two) times daily., Disp: , Rfl:  .  atorvastatin (LIPITOR) 80 MG tablet, Take 1 tablet (80 mg total) by mouth daily., Disp: 90 tablet, Rfl: 1 .  clopidogrel (PLAVIX) 75 MG tablet, Take 1 tablet (75 mg total) by mouth daily with breakfast., Disp: 90 tablet, Rfl: 1 .  escitalopram (LEXAPRO) 20 MG tablet, Take 20 mg by mouth daily., Disp: , Rfl:  .  estradiol (ESTRACE) 1 MG tablet, Take 1 mg by mouth daily. , Disp: , Rfl:  .  ezetimibe (ZETIA) 10 MG tablet, Take 10 mg by mouth daily., Disp: , Rfl:  .  hydrochlorothiazide (MICROZIDE) 12.5 MG capsule, Take 1 capsule by mouth once daily in the morning, Disp: 90 capsule, Rfl: 0 .  ibuprofen (ADVIL) 200 MG tablet, Take 200 mg by mouth every 6 (six) hours as needed., Disp: , Rfl:  .  irbesartan (AVAPRO) 300 MG tablet, Take 300 mg by mouth daily., Disp: , Rfl:  .  levothyroxine (SYNTHROID, LEVOTHROID) 100 MCG tablet, Take 100 mcg by mouth at bedtime. , Disp: , Rfl:  .  medroxyPROGESTERone (PROVERA) 5 MG tablet, Take 5 mg by mouth daily., Disp: , Rfl:  .  metoprolol succinate (TOPROL XL) 50 MG 24 hr tablet, Take 1 tablet (50 mg total) by mouth  in the morning. Hold if systolic blood pressure (top blood pressure number) less than 100 mmHg or heart rate less than 60 bpm (pulse)., Disp: 90 tablet, Rfl: 0 .  pantoprazole (PROTONIX) 40 MG tablet, Take 1 tablet (40 mg total) by mouth daily., Disp: 90 tablet, Rfl: 1  Past Medical History: Past Medical History:  Diagnosis Date  . Arthritis   . Coronary artery disease   . Depression   . Diabetes mellitus without complication ([place])   . GERD (gastroesophageal reflux disease)   . Hypercholesteremia   . Hypertension   . Hypothyroidism   . Pre-diabetes 08/19/2019  . Sleep apnea    mild-modreate-tried cpap    Tobacco Use: Social History   Tobacco Use  Smoking Status Never Smoker  Smokeless Tobacco Never Used    Labs: Recent Review Scientist, physiological    Labs for ITP Cardiac and Pulmonary Rehab Latest Ref Rng & Units 09/30/2011   TCO2 0 - 100 mmol/L 24      Capillary Blood Glucose: Lab Results  Component Value Date   GLUCAP 148 (H) 03/18/2020     Exercise Target Goals: Exercise Program Goal: Individual exercise prescription set using results from initial 6 min walk test and THRR while considering  patient's activity barriers and safety.   Exercise Prescription Goal:  Starting with aerobic activity 30 plus minutes a day, 3 days per week for initial exercise prescription. Provide home exercise prescription and guidelines that participant acknowledges understanding prior to discharge.  Activity Barriers & Risk Stratification:  Activity Barriers & Cardiac Risk Stratification - 06/12/20 1550      Activity Barriers & Cardiac Risk Stratification   Activity Barriers Back Problems;Joint Problems    Cardiac Risk Stratification Moderate           6 Minute Walk:  6 Minute Walk    Row Name 06/12/20 1549         6 Minute Walk   Phase Initial     Distance 1200 feet     Walk Time 6 minutes     # of Rest Breaks 0     MPH 2.3     METS 2.3     RPE 12     Perceived Dyspnea  1      VO2 Peak 7.9     Symptoms Yes (comment)     Comments mild +1 SOB, and light headed toward end of walk test due to mask     Resting HR 75 bpm     Resting BP 118/74     Resting Oxygen Saturation  97 %     Exercise Oxygen Saturation  during 6 min walk 98 %     Max Ex. HR 116 bpm     Max Ex. BP 120/60     2 Minute Post BP 116/62            Oxygen Initial Assessment:   Oxygen Re-Evaluation:   Oxygen Discharge (Final Oxygen Re-Evaluation):   Initial Exercise Prescription:  Initial Exercise Prescription - 06/12/20 1500      Date of Initial Exercise RX and Referring Provider   Date 06/12/20    Referring Provider Rex Kras DO    Expected Discharge Date 08/08/20      NuStep   Level 2    SPM 70    Minutes 30    METs 2.2      Prescription Details   Frequency (times per week) 3x    Duration Progress to 10 minutes continuous walking  at current work load and total walking time to 30-45 min      Intensity   THRR 40-80% of Max Heartrate 61-122    Ratings of Perceived Exertion 11-13    Perceived Dyspnea 0-4      Progression   Progression Continue progressive overload as per policy without signs/symptoms or physical distress.      Resistance Training   Training Prescription Yes    Weight 3lbs    Reps 10-15           Perform Capillary Blood Glucose checks as needed.  Exercise Prescription Changes:   Exercise Comments:   Exercise Goals and Review:   Exercise Goals    Row Name 06/12/20 1541             Exercise Goals   Increase Physical Activity Yes       Intervention Provide advice, education, support and counseling about physical activity/exercise needs.;Develop an individualized exercise prescription for aerobic and resistive training based on initial evaluation findings, risk stratification, comorbidities and participant's personal goals.       Expected Outcomes Short Term: Attend rehab on a regular basis to increase amount of physical  activity.;Long Term: Add in home exercise to make exercise part of routine and to increase amount of physical activity.;Long  Term: Exercising regularly at least 3-5 days a week.       Increase Strength and Stamina Yes       Intervention Provide advice, education, support and counseling about physical activity/exercise needs.;Develop an individualized exercise prescription for aerobic and resistive training based on initial evaluation findings, risk stratification, comorbidities and participant's personal goals.       Expected Outcomes Short Term: Increase workloads from initial exercise prescription for resistance, speed, and METs.;Long Term: Improve cardiorespiratory fitness, muscular endurance and strength as measured by increased METs and functional capacity (6MWT);Short Term: Perform resistance training exercises routinely during rehab and add in resistance training at home       Able to understand and use rate of perceived exertion (RPE) scale Yes       Intervention Provide education and explanation on how to use RPE scale       Expected Outcomes Short Term: Able to use RPE daily in rehab to express subjective intensity level;Long Term:  Able to use RPE to guide intensity level when exercising independently       Knowledge and understanding of Target Heart Rate Range (THRR) Yes       Intervention Provide education and explanation of THRR including how the numbers were predicted and where they are located for reference       Expected Outcomes Short Term: Able to state/look up THRR;Short Term: Able to use daily as guideline for intensity in rehab;Long Term: Able to use THRR to govern intensity when exercising independently       Able to check pulse independently Yes       Intervention Provide education and demonstration on how to check pulse in carotid and radial arteries.;Review the importance of being able to check your own pulse for safety during independent exercise       Expected Outcomes Short  Term: Able to explain why pulse checking is important during independent exercise;Long Term: Able to check pulse independently and accurately       Understanding of Exercise Prescription Yes       Intervention Provide education, explanation, and written materials on patient's individual exercise prescription       Expected Outcomes Short Term: Able to explain program exercise prescription;Long Term: Able to explain home exercise prescription to exercise independently              Exercise Goals Re-Evaluation :    Discharge Exercise Prescription (Final Exercise Prescription Changes):   Nutrition:  Target Goals: Understanding of nutrition guidelines, daily intake of sodium 1500mg , cholesterol 200mg , calories 30% from fat and 7% or less from saturated fats, daily to have 5 or more servings of fruits and vegetables.  Biometrics:  Pre Biometrics - 06/12/20 1550      Pre Biometrics   Height 5' 7.5" (1.715 m)    Weight 115.7 kg    Waist Circumference 49 inches    Hip Circumference 47 inches    Waist to Hip Ratio 1.04 %    BMI (Calculated) 39.34    Triceps Skinfold 35 mm    % Body Fat 50.6 %    Grip Strength 34 kg    Flexibility 0 in    Single Leg Stand 2.1 seconds            Nutrition Therapy Plan and Nutrition Goals:   Nutrition Assessments:   Nutrition Goals Re-Evaluation:   Nutrition Goals Discharge (Final Nutrition Goals Re-Evaluation):   Psychosocial: Target Goals: Acknowledge presence or absence of significant depression and/or  stress, maximize coping skills, provide positive support system. Participant is able to verbalize types and ability to use techniques and skills needed for reducing stress and depression.  Initial Review & Psychosocial Screening:  Initial Psych Review & Screening - 06/12/20 1540      Initial Review   Current issues with Current Anxiety/Panic   Shantoya says that she feels a little depressed at times but has the support of her sisters  and [place]? Yes   Keeghan lives alone but has her sisters who live nearby for support     Barriers   Psychosocial barriers to participate in program The patient should benefit from training in stress management and relaxation.      Screening Interventions   Interventions Encouraged to exercise    Expected Outcomes Long Term Goal: Stressors or current issues are controlled or eliminated.;Short Term goal: Identification and review with participant of any Quality of Life or Depression concerns found by scoring the questionnaire.           Quality of Life Scores:  Quality of Life - 06/12/20 1552      Quality of Life   Select Quality of Life      Quality of Life Scores   Health/Function Pre 16.03 %    Socioeconomic Pre 23.86 %    Psych/Spiritual Pre 22.14 %    Family Pre 25.5 %    GLOBAL Pre 20.14 %          Scores of 19 and below usually indicate a poorer quality of life in these areas.  A difference of  2-3 points is a clinically meaningful difference.  A difference of 2-3 points in the total score of the Quality of Life Index has been associated with significant improvement in overall quality of life, self-image, physical symptoms, and general health in studies assessing change in quality of life.  PHQ-9: Recent Review Flowsheet Data    Depression screen [facility] 2/9 06/12/2020   Decreased Interest 1   Down, Depressed, Hopeless 0   PHQ - 2 Score 1     Interpretation of Total Score  Total Score Depression Severity:  1-4 = Minimal depression, 5-9 = Mild depression, 10-14 = Moderate depression, 15-19 = Moderately severe depression, 20-27 = Severe depression   Psychosocial Evaluation and Intervention:   Psychosocial Re-Evaluation:   Psychosocial Discharge (Final Psychosocial Re-Evaluation):   Vocational Rehabilitation: Provide vocational rehab assistance to qualifying candidates.   Vocational Rehab Evaluation &  Intervention:  Vocational Rehab - 06/12/20 1544      Initial Vocational Rehab Evaluation & Intervention   Assessment shows need for Vocational Rehabilitation No   Nozomi works 3 days a week and does not need vocational rehab at this time          Education: Education Goals: Education classes will be provided on a weekly basis, covering required topics. Participant will state understanding/return demonstration of topics presented.  Learning Barriers/Preferences:  Learning Barriers/Preferences - 06/12/20 1553      Learning Barriers/Preferences   Learning Barriers None    Learning Preferences Written Material;Skilled Demonstration;Individual Instruction           Education Topics: Hypertension, Hypertension Reduction -Define heart disease and high blood pressure. Discus how high blood pressure affects the body and ways to reduce high blood pressure.   Exercise and Your Heart -Discuss why it is important to exercise, the FITT principles of exercise, normal and abnormal responses  to exercise, and how to exercise safely.   Angina -Discuss definition of angina, causes of angina, treatment of angina, and how to decrease risk of having angina.   Cardiac Medications -Review what the following cardiac medications are used for, how they affect the body, and side effects that may occur when taking the medications.  Medications include Aspirin, Beta blockers, calcium channel blockers, ACE Inhibitors, angiotensin receptor blockers, diuretics, digoxin, and antihyperlipidemics.   Congestive Heart Failure -Discuss the definition of CHF, how to live with CHF, the signs and symptoms of CHF, and how keep track of weight and sodium intake.   Heart Disease and Intimacy -Discus the effect sexual activity has on the heart, how changes occur during intimacy as we age, and safety during sexual activity.   Smoking Cessation / COPD -Discuss different methods to quit smoking, the health benefits  of quitting smoking, and the definition of COPD.   Nutrition I: Fats -Discuss the types of cholesterol, what cholesterol does to the heart, and how cholesterol levels can be controlled.   Nutrition II: Labels -Discuss the different components of food labels and how to read food label   Heart Parts/Heart Disease and PAD -Discuss the anatomy of the heart, the pathway of blood circulation through the heart, and these are affected by heart disease.   Stress I: Signs and Symptoms -Discuss the causes of stress, how stress may lead to anxiety and depression, and ways to limit stress.   Stress II: Relaxation -Discuss different types of relaxation techniques to limit stress.   Warning Signs of Stroke / TIA -Discuss definition of a stroke, what the signs and symptoms are of a stroke, and how to identify when someone is having stroke.   Knowledge Questionnaire Score:  Knowledge Questionnaire Score - 06/12/20 1551      Knowledge Questionnaire Score   Pre Score 19/24           Core Components/Risk Factors/Patient Goals at Admission:  Personal Goals and Risk Factors at Admission - 06/12/20 1611      Core Components/Risk Factors/Patient Goals on Admission    Weight Management Obesity;Yes    Intervention Weight Management: Provide education and appropriate resources to help participant work on and attain dietary goals.;Weight Management: Develop a combined nutrition and exercise program designed to reach desired caloric intake, while maintaining appropriate intake of nutrient and fiber, sodium and fats, and appropriate energy expenditure required for the weight goal.;Weight Management/Obesity: Establish reasonable short term and long term weight goals.;Obesity: Provide education and appropriate resources to help participant work on and attain dietary goals.    Admit Weight 255 lb 1.2 oz (115.7 kg)    Goal Weight: Long Term 220 lb (99.8 kg)    Expected Outcomes Short Term: Continue to  assess and modify interventions until short term weight is achieved;Long Term: Adherence to nutrition and physical activity/exercise program aimed toward attainment of established weight goal;Understanding recommendations for meals to include 15-35% energy as protein, 25-35% energy from fat, 35-60% energy from carbohydrates, less than 200mg  of dietary cholesterol, 20-35 gm of total fiber daily;Weight Loss: Understanding of general recommendations for a balanced deficit meal plan, which promotes 1-2 lb weight loss per week and includes a negative energy balance of [phone] kcal/d;Understanding of distribution of calorie intake throughout the day with the consumption of 4-5 meals/snacks    Diabetes Yes    Intervention Provide education about signs/symptoms and action to take for hypo/hyperglycemia.;Provide education about proper nutrition, including hydration, and aerobic/resistive exercise prescription along  with prescribed medications to achieve blood glucose in normal ranges: Fasting glucose 65-99 mg/dL    Expected Outcomes Short Term: Participant verbalizes understanding of the signs/symptoms and immediate care of hyper/hypoglycemia, proper foot care and importance of medication, aerobic/resistive exercise and nutrition plan for blood glucose control.;Long Term: Attainment of HbA1C < 7%.    Hypertension Yes    Intervention Provide education on lifestyle modifcations including regular physical activity/exercise, weight management, moderate sodium restriction and increased consumption of fresh fruit, vegetables, and low fat dairy, alcohol moderation, and smoking cessation.;Monitor prescription use compliance.    Expected Outcomes Short Term: Continued assessment and intervention until BP is < 140/31mm HG in hypertensive participants. < 130/60mm HG in hypertensive participants with diabetes, heart failure or chronic kidney disease.;Long Term: Maintenance of blood pressure at goal levels.    Lipids Yes     Intervention Provide education and support for participant on nutrition & aerobic/resistive exercise along with prescribed medications to achieve LDL 70mg , HDL >40mg .    Expected Outcomes Short Term: Participant states understanding of desired cholesterol values and is compliant with medications prescribed. Participant is following exercise prescription and nutrition guidelines.;Long Term: Cholesterol controlled with medications as prescribed, with individualized exercise RX and with personalized nutrition plan. Value goals: LDL < 70mg , HDL > 40 mg.    Stress Yes    Intervention Offer individual and/or small group education and counseling on adjustment to heart disease, stress management and health-related lifestyle change. Teach and support self-help strategies.;Refer participants experiencing significant psychosocial distress to appropriate mental health specialists for further evaluation and treatment. When possible, include family members and significant others in education/counseling sessions.    Expected Outcomes Short Term: Participant demonstrates changes in health-related behavior, relaxation and other stress management skills, ability to obtain effective social support, and compliance with psychotropic medications if prescribed.;Long Term: Emotional wellbeing is indicated by absence of clinically significant psychosocial distress or social isolation.           Core Components/Risk Factors/Patient Goals Review:    Core Components/Risk Factors/Patient Goals at Discharge (Final Review):    ITP Comments:  ITP Comments    Row Name 06/12/20 1541 06/12/20 1549         ITP Comments Dr. Fransico Him, Medical Director Dr. Fransico Him MD, Medical Director             Comments: Nevin Bloodgood attended orientation on 06/12/2020 to review rules and guidelines for program.  Completed 6 minute walk test, Intitial ITP, and exercise prescription.  VSS. Telemetry-Sinus Rhythm with an inverted T wave this  has been previously documented. Xiomara did report feeling mildly  lightheaded at the end of the walk test due to  Having to wear a mask this resolved at rest.. Safety measures and social distancing in place per CDC guidelines.Barnet Pall, RN,BSN 06/12/2020 4:17 PM

## 2020-06-16 ENCOUNTER — Encounter (HOSPITAL_COMMUNITY)
Admission: RE | Admit: 2020-06-16 | Discharge: 2020-06-16 | Disposition: A | Discharge status: Home / Self Care | Payer: PPO | Source: Ambulatory Visit | Attending: Cardiology | Admitting: Cardiology

## 2020-06-16 ENCOUNTER — Other Ambulatory Visit: Payer: Self-pay

## 2020-06-16 DIAGNOSIS — Z955 Presence of coronary angioplasty implant and graft: Secondary | ICD-10-CM

## 2020-06-16 LAB — GLUCOSE, CAPILLARY: Glucose-Capillary: 142 mg/dL — ABNORMAL HIGH (ref 70–99)

## 2020-06-16 NOTE — Progress Notes (Signed)
Daily Session Note  Patient Details  Name: [name] MRN: [id number] Date of Birth: Jan 19, 1951 Referring Provider:     [employer] from 06/12/2020 in [place]  Referring Provider Rex Kras DO      Encounter Date: 06/16/2020  Check In:  Session Check In - 06/16/20 1347      Check-In   Supervising physician immediately available to respond to emergencies [facility] immediately available    Physician(s) Dr. Maylene Roes    Location [facility]    Staff Present Lesly Rubenstein, MS, EP-C, CCRP;Tyara Nevels, MS,ACSM CEP, Exercise Physiologist;Desta Bujak Rollene Rotunda, RN, Deland Pretty, MS, ACSM CEP, Exercise Physiologist;Maria Whitaker, RN, BSN    Virtual Visit No    Medication changes reported     No    Fall or balance concerns reported    No    Tobacco Cessation No Change    Warm-up and Cool-down Performed on first and last piece of equipment    Resistance Training Performed Yes    VAD Patient? No    PAD/SET Patient? No      Pain Assessment   Currently in Pain? No/denies    Pain Score 0-No pain    Multiple Pain Sites No           Capillary Blood Glucose: Results for orders placed or performed during the hospital encounter of 06/16/20 (from the past 24 hour(s))  Glucose, capillary     Status: Abnormal   Collection Time: 06/16/20  1:31 PM  Result Value Ref Range   Glucose-Capillary 142 (H) 70 - 99 mg/dL     Exercise Prescription Changes - 06/16/20 1600      Response to Exercise   Blood Pressure (Admit) 118/68    Blood Pressure (Exercise) 130/70    Blood Pressure (Exit) 120/74    Heart Rate (Admit) 81 bpm    Heart Rate (Exercise) 89 bpm    Heart Rate (Exit) 81 bpm    Rating of Perceived Exertion (Exercise) 12    Symptoms None    Comments Pt's first day of exercise in the [facility] program.    Duration Progress to 30 minutes of  aerobic without signs/symptoms of physical distress    Intensity THRR  unchanged      Progression   Progression Continue to progress workloads to maintain intensity without signs/symptoms of physical distress.    Average METs 1.9      Resistance Training   Training Prescription Yes    Weight 3 lbs    Reps 10-15    Time 10 Minutes      Interval Training   Interval Training No      NuStep   Level 2    SPM 70    Minutes 30    METs 1.9           Social History   Tobacco Use  Smoking Status Never Smoker  Smokeless Tobacco Never Used    Goals Met:  Exercise tolerated well Personal goals reviewed No report of cardiac concerns or symptoms Strength training completed today  Goals Unmet:  Not Applicable  Comments: Pt started cardiac rehab today.  Pt tolerated light exercise without difficulty. VSS, telemetry-NSR with T wave inversion, asymptomatic.  Medication list reconciled. Pt denies barriers to medicaiton compliance.  PSYCHOSOCIAL ASSESSMENT:  PHQ-1. Pt exhibits positive coping skills, hopeful outlook with supportive family. No psychosocial needs identified at this time, no psychosocial interventions necessary.  Pt oriented to  exercise equipment and routine.    Understanding verbalized.   Dr. Fransico Him is Medical Director for Cardiac Rehab at [facility]

## 2020-06-18 ENCOUNTER — Encounter (HOSPITAL_COMMUNITY)
Admission: RE | Admit: 2020-06-18 | Discharge: 2020-06-18 | Disposition: A | Discharge status: Home / Self Care | Payer: PPO | Source: Ambulatory Visit | Attending: Cardiology | Admitting: Cardiology

## 2020-06-18 ENCOUNTER — Other Ambulatory Visit: Payer: Self-pay

## 2020-06-18 DIAGNOSIS — I2510 Atherosclerotic heart disease of native coronary artery without angina pectoris: Secondary | ICD-10-CM | POA: Diagnosis not present

## 2020-06-18 DIAGNOSIS — Z955 Presence of coronary angioplasty implant and graft: Secondary | ICD-10-CM | POA: Diagnosis not present

## 2020-06-18 NOTE — Progress Notes (Signed)
[name] 69 y.o. 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 Arthritis   . Coronary artery disease   . Depression   . Diabetes mellitus without complication ([place])   . GERD (gastroesophageal reflux disease)   . Hypercholesteremia   . Hypertension   . Hypothyroidism   . Pre-diabetes 08/19/2019  . Sleep apnea    mild-modreate-tried cpap     Medications reviewed.   Current Outpatient Medications:  .  acetaminophen (TYLENOL) 500 MG tablet, Take 1,000 mg by mouth every 6 (six) hours as needed for mild pain or headache., Disp: , Rfl:  .  amLODipine (NORVASC) 10 MG tablet, Take 10 mg by mouth daily. , Disp: , Rfl:  .  apixaban (ELIQUIS) 5 MG TABS tablet, Take 5 mg by mouth 2 (two) times daily., Disp: , Rfl:  .  atorvastatin (LIPITOR) 80 MG tablet, Take 1 tablet (80 mg total) by mouth daily., Disp: 90 tablet, Rfl: 1 .  clopidogrel (PLAVIX) 75 MG tablet, Take 1 tablet (75 mg total) by mouth daily with breakfast., Disp: 90 tablet, Rfl: 1 .  escitalopram (LEXAPRO) 20 MG tablet, Take 20 mg by mouth daily., Disp: , Rfl:  .  estradiol (ESTRACE) 1 MG tablet, Take 1 mg by mouth daily. , Disp: , Rfl:  .  ezetimibe (ZETIA) 10 MG tablet, Take 10 mg by mouth daily., Disp: , Rfl:  .  hydrochlorothiazide (MICROZIDE) 12.5 MG capsule, Take 1 capsule by mouth once daily in the morning, Disp: 90 capsule, Rfl: 0 .  ibuprofen (ADVIL) 200 MG tablet, Take 200 mg by mouth every 6 (six) hours as needed., Disp: , Rfl:  .  irbesartan (AVAPRO) 300 MG tablet, Take 300 mg by mouth daily., Disp: , Rfl:  .  levothyroxine (SYNTHROID, LEVOTHROID) 100 MCG tablet, Take 100 mcg by mouth at bedtime. , Disp: , Rfl:  .  medroxyPROGESTERone (PROVERA) 5 MG tablet, Take 5 mg by mouth daily., Disp: , Rfl:  .  metoprolol succinate (TOPROL XL) 50 MG 24 hr tablet, Take 1 tablet (50 mg total) by mouth in the morning. Hold if systolic blood pressure (top blood  pressure number) less than 100 mmHg or heart rate less than 60 bpm (pulse)., Disp: 90 tablet, Rfl: 1 .  pantoprazole (PROTONIX) 40 MG tablet, Take 1 tablet (40 mg total) by mouth daily., Disp: 90 tablet, Rfl: 1   Ht Readings from Last 1 Encounters:  06/12/20 5' 7.5" (1.715 m)     Wt Readings from Last 3 Encounters:  06/12/20 255 lb 1.2 oz (115.7 kg)  03/26/20 249 lb (112.9 kg)  03/18/20 246 lb (111.6 kg)     There is no height or weight on file to calculate BMI.   Social History   Tobacco Use  Smoking Status Never Smoker  Smokeless Tobacco Never Used     No results found for: CHOL No results found for: HDL No results found for: LDLCALC No results found for: TRIG   No results found for: HGBA1C   CBG (last 3)  Recent Labs    06/16/20 1331  GLUCAP 142*     Nutrition Note  Spoke with pt. Nutrition Plan and Nutrition Survey goals reviewed with pt.   Pt has Type 2 Diabetes. Last A1c indicates blood glucose well-controlled. Pt has never been on medication. Her last A1C was 6.6.  Pt eats out often for dinner. She was trying to lose  weight. She had started eating salads daily and cutting back portions.  Will review heart healthy diet.   Pt expressed understanding of the information reviewed.   Nutrition Diagnosis ? Food-and nutrition-related knowledge deficit related to lack of exposure to information as related to diagnosis of: ? CVD ? Pre-diabetes  Nutrition Intervention ? Pt's individual nutrition plan reviewed with pt. ? Benefits of adopting Heart Healthy diet discussed when Med[employer] reviewed.   ? Continue client-centered nutrition education by RD, as part of interdisciplinary care.  Goal(s) ? Pt to identify food quantities necessary to achieve weight loss of 6-24 lb at graduation from cardiac rehab.  ? Pt to build a healthy plate including vegetables, fruits, whole grains, and low-fat dairy products in a heart healthy meal plan. ? Pt able to name foods that  affect blood glucose  Plan:     Will provide client-centered nutrition education as part of interdisciplinary care  Monitor and evaluate progress toward nutrition goal with team.   Michaele Offer, MS, RDN, LDN

## 2020-06-20 ENCOUNTER — Other Ambulatory Visit: Payer: Self-pay

## 2020-06-20 ENCOUNTER — Encounter (HOSPITAL_COMMUNITY)
Admission: RE | Admit: 2020-06-20 | Discharge: 2020-06-20 | Disposition: A | Discharge status: Home / Self Care | Payer: PPO | Source: Ambulatory Visit | Attending: Cardiology | Admitting: Cardiology

## 2020-06-20 DIAGNOSIS — Z955 Presence of coronary angioplasty implant and graft: Secondary | ICD-10-CM

## 2020-06-24 ENCOUNTER — Other Ambulatory Visit: Payer: Self-pay | Admitting: Cardiology

## 2020-06-24 DIAGNOSIS — I2510 Atherosclerotic heart disease of native coronary artery without angina pectoris: Secondary | ICD-10-CM

## 2020-06-24 DIAGNOSIS — Z7901 Long term (current) use of anticoagulants: Secondary | ICD-10-CM

## 2020-06-24 DIAGNOSIS — I4892 Unspecified atrial flutter: Secondary | ICD-10-CM

## 2020-06-24 NOTE — Progress Notes (Signed)
Cardiac Individual Treatment Plan  Patient Details  Name: [name] MRN: [id number] Date of Birth: 1951/02/14 Referring Provider:     [facility] from 06/12/2020 in [place]  Referring Provider Rex Kras DO      Initial Encounter Date:    CARDIAC REHAB PHASE II ORIENTATION from 06/12/2020 in [place]  Date 06/12/20      Visit Diagnosis: S/P DES x 2 LAD 03/18/20  Patient's Home Medications on Admission:  Current Outpatient Medications:  .  acetaminophen (TYLENOL) 500 MG tablet, Take 1,000 mg by mouth every 6 (six) hours as needed for mild pain or headache., Disp: , Rfl:  .  amLODipine (NORVASC) 10 MG tablet, Take 10 mg by mouth daily. , Disp: , Rfl:  .  apixaban (ELIQUIS) 5 MG TABS tablet, Take 5 mg by mouth 2 (two) times daily., Disp: , Rfl:  .  atorvastatin (LIPITOR) 80 MG tablet, Take 1 tablet (80 mg total) by mouth daily., Disp: 90 tablet, Rfl: 1 .  clopidogrel (PLAVIX) 75 MG tablet, Take 1 tablet (75 mg total) by mouth daily with breakfast., Disp: 90 tablet, Rfl: 1 .  escitalopram (LEXAPRO) 20 MG tablet, Take 20 mg by mouth daily., Disp: , Rfl:  .  estradiol (ESTRACE) 1 MG tablet, Take 1 mg by mouth daily. , Disp: , Rfl:  .  ezetimibe (ZETIA) 10 MG tablet, Take 10 mg by mouth daily., Disp: , Rfl:  .  hydrochlorothiazide (MICROZIDE) 12.5 MG capsule, Take 1 capsule by mouth once daily in the morning, Disp: 90 capsule, Rfl: 0 .  ibuprofen (ADVIL) 200 MG tablet, Take 200 mg by mouth every 6 (six) hours as needed., Disp: , Rfl:  .  irbesartan (AVAPRO) 300 MG tablet, Take 300 mg by mouth daily., Disp: , Rfl:  .  levothyroxine (SYNTHROID, LEVOTHROID) 100 MCG tablet, Take 100 mcg by mouth at bedtime. , Disp: , Rfl:  .  medroxyPROGESTERone (PROVERA) 5 MG tablet, Take 5 mg by mouth daily., Disp: , Rfl:  .  metoprolol succinate (TOPROL XL) 50 MG 24 hr tablet, Take 1 tablet (50 mg total) by mouth  in the morning. Hold if systolic blood pressure (top blood pressure number) less than 100 mmHg or heart rate less than 60 bpm (pulse)., Disp: 90 tablet, Rfl: 1 .  pantoprazole (PROTONIX) 40 MG tablet, Take 1 tablet (40 mg total) by mouth daily., Disp: 90 tablet, Rfl: 1  Past Medical History: Past Medical History:  Diagnosis Date  . Arthritis   . Coronary artery disease   . Depression   . Diabetes mellitus without complication ([place])   . GERD (gastroesophageal reflux disease)   . Hypercholesteremia   . Hypertension   . Hypothyroidism   . Pre-diabetes 08/19/2019  . Sleep apnea    mild-modreate-tried cpap    Tobacco Use: Social History   Tobacco Use  Smoking Status Never Smoker  Smokeless Tobacco Never Used    Labs: Recent Review Scientist, physiological    Labs for ITP Cardiac and Pulmonary Rehab Latest Ref Rng & Units 09/30/2011 06/25/2020   Cholestrol 100 - 199 mg/dL - 154   LDLCALC 0 - 99 mg/dL - 68   LDLDIRECT 0 - 99 mg/dL - 68   HDL >39 mg/dL - 55   Trlycerides 0 - 149 mg/dL - 188(H)   TCO2 0 - 100 mmol/L 24 -      Capillary Blood Glucose: Lab Results  Component  Value Date   GLUCAP 142 (H) 06/16/2020   GLUCAP 148 (H) 03/18/2020     Exercise Target Goals: Exercise Program Goal: Individual exercise prescription set using results from initial 6 min walk test and THRR while considering  patient's activity barriers and safety.   Exercise Prescription Goal: Initial exercise prescription builds to 30-45 minutes a day of aerobic activity, 2-3 days per week.  Home exercise guidelines will be given to patient during program as part of exercise prescription that the participant will acknowledge.  Activity Barriers & Risk Stratification:  Activity Barriers & Cardiac Risk Stratification - 06/12/20 1550      Activity Barriers & Cardiac Risk Stratification   Activity Barriers Back Problems;Joint Problems    Cardiac Risk Stratification Moderate           6 Minute Walk:  6  Minute Walk    Row Name 06/12/20 1549         6 Minute Walk   Phase Initial     Distance 1200 feet     Walk Time 6 minutes     # of Rest Breaks 0     MPH 2.3     METS 2.3     RPE 12     Perceived Dyspnea  1     VO2 Peak 7.9     Symptoms Yes (comment)     Comments mild +1 SOB, and light headed toward end of walk test due to mask     Resting HR 75 bpm     Resting BP 118/74     Resting Oxygen Saturation  97 %     Exercise Oxygen Saturation  during 6 min walk 98 %     Max Ex. HR 116 bpm     Max Ex. BP 120/60     2 Minute Post BP 116/62            Oxygen Initial Assessment:   Oxygen Re-Evaluation:   Oxygen Discharge (Final Oxygen Re-Evaluation):   Initial Exercise Prescription:  Initial Exercise Prescription - 06/12/20 1500      Date of Initial Exercise RX and Referring Provider   Date 06/12/20    Referring Provider Rex Kras DO    Expected Discharge Date 08/08/20      NuStep   Level 2    SPM 70    Minutes 30    METs 2.2      Prescription Details   Frequency (times per week) 3x    Duration Progress to 10 minutes continuous walking  at current work load and total walking time to 30-45 min      Intensity   THRR 40-80% of Max Heartrate 61-122    Ratings of Perceived Exertion 11-13    Perceived Dyspnea 0-4      Progression   Progression Continue progressive overload as per policy without signs/symptoms or physical distress.      Resistance Training   Training Prescription Yes    Weight 3lbs    Reps 10-15           Perform Capillary Blood Glucose checks as needed.  Exercise Prescription Changes:   Exercise Prescription Changes    Row Name 06/16/20 1600 06/25/20 1430           Response to Exercise   Blood Pressure (Admit) 118/68 120/70      Blood Pressure (Exercise) 130/70 114/70      Blood Pressure (Exit) 120/74 108/70      Heart Rate (Admit)  81 bpm 84 bpm      Heart Rate (Exercise) 89 bpm 90 bpm      Heart Rate (Exit) 81 bpm 64 bpm       Rating of Perceived Exertion (Exercise) 12 12      Symptoms None None      Comments Pt's first day of exercise in the CRP2 program. Reviwed METs and home Exercise Rx      Duration Progress to 30 minutes of  aerobic without signs/symptoms of physical distress Progress to 30 minutes of  aerobic without signs/symptoms of physical distress      Intensity THRR unchanged THRR unchanged        Progression   Progression Continue to progress workloads to maintain intensity without signs/symptoms of physical distress. Continue to progress workloads to maintain intensity without signs/symptoms of physical distress.      Average METs 1.9 1.9        Resistance Training   Training Prescription Yes No      Weight 3 lbs --  No weights done on Wednesday      Reps 10-15 --      Time 10 Minutes --        Interval Training   Interval Training No No        NuStep   Level 2 2      SPM 70 75      Minutes 30 30      METs 1.9 1.9        Home Exercise Plan   Plans to continue exercise at -- Home (comment)      Frequency -- Add 2 additional days to program exercise sessions.      Initial Home Exercises Provided -- 06/25/20             Exercise Comments:   Exercise Comments    Row Name 06/16/20 1605 06/25/20 1440         Exercise Comments Pt first day of exercise in the CRP2 program. Pt tolerated exercise well with no complaints. Reviewed METs and provided home exercise Rx to patient. Pt to walk 30 minutes 2x/week at home.             Exercise Goals and Review:   Exercise Goals    Row Name 06/12/20 1541             Exercise Goals   Increase Physical Activity Yes       Intervention Provide advice, education, support and counseling about physical activity/exercise needs.;Develop an individualized exercise prescription for aerobic and resistive training based on initial evaluation findings, risk stratification, comorbidities and participant's personal goals.       Expected Outcomes  Short Term: Attend rehab on a regular basis to increase amount of physical activity.;Long Term: Add in home exercise to make exercise part of routine and to increase amount of physical activity.;Long Term: Exercising regularly at least 3-5 days a week.       Increase Strength and Stamina Yes       Intervention Provide advice, education, support and counseling about physical activity/exercise needs.;Develop an individualized exercise prescription for aerobic and resistive training based on initial evaluation findings, risk stratification, comorbidities and participant's personal goals.       Expected Outcomes Short Term: Increase workloads from initial exercise prescription for resistance, speed, and METs.;Long Term: Improve cardiorespiratory fitness, muscular endurance and strength as measured by increased METs and functional capacity (6MWT);Short Term: Perform resistance training exercises routinely during rehab and  add in resistance training at home       Able to understand and use rate of perceived exertion (RPE) scale Yes       Intervention Provide education and explanation on how to use RPE scale       Expected Outcomes Short Term: Able to use RPE daily in rehab to express subjective intensity level;Long Term:  Able to use RPE to guide intensity level when exercising independently       Knowledge and understanding of Target Heart Rate Range (THRR) Yes       Intervention Provide education and explanation of THRR including how the numbers were predicted and where they are located for reference       Expected Outcomes Short Term: Able to state/look up THRR;Short Term: Able to use daily as guideline for intensity in rehab;Long Term: Able to use THRR to govern intensity when exercising independently       Able to check pulse independently Yes       Intervention Provide education and demonstration on how to check pulse in carotid and radial arteries.;Review the importance of being able to check your own  pulse for safety during independent exercise       Expected Outcomes Short Term: Able to explain why pulse checking is important during independent exercise;Long Term: Able to check pulse independently and accurately       Understanding of Exercise Prescription Yes       Intervention Provide education, explanation, and written materials on patient's individual exercise prescription       Expected Outcomes Short Term: Able to explain program exercise prescription;Long Term: Able to explain home exercise prescription to exercise independently              Exercise Goals Re-Evaluation :  Exercise Goals Re-Evaluation    Row Name 06/16/20 1600 06/25/20 1435           Exercise Goal Re-Evaluation   Exercise Goals Review Increase Physical Activity;Increase Strength and Stamina;Able to understand and use rate of perceived exertion (RPE) scale;Knowledge and understanding of Target Heart Rate Range (THRR);Able to check pulse independently;Understanding of Exercise Prescription Increase Physical Activity;Increase Strength and Stamina;Able to understand and use rate of perceived exertion (RPE) scale;Knowledge and understanding of Target Heart Rate Range (THRR);Able to check pulse independently;Understanding of Exercise Prescription      Comments Pt's first day in the CRP2 program. Pt tolerated exercise Rx well and understands exercise Rx, RPE scale, and THRR. Reviewed METs and Home Exercise Prescription. Pt encouraged to walk at home 2x/wk for 30 minutes. Pt verbalized understanding of home exercise Rx and was provided copy.      Expected Outcomes Will continue to monitor patient and progress as tolerated. Pt will walk at home 2x/week for 30 minutes,             Discharge Exercise Prescription (Final Exercise Prescription Changes):  Exercise Prescription Changes - 06/25/20 1430      Response to Exercise   Blood Pressure (Admit) 120/70    Blood Pressure (Exercise) 114/70    Blood Pressure (Exit)  108/70    Heart Rate (Admit) 84 bpm    Heart Rate (Exercise) 90 bpm    Heart Rate (Exit) 64 bpm    Rating of Perceived Exertion (Exercise) 12    Symptoms None    Comments Reviwed METs and home Exercise Rx    Duration Progress to 30 minutes of  aerobic without signs/symptoms of physical distress    Intensity THRR  unchanged      Progression   Progression Continue to progress workloads to maintain intensity without signs/symptoms of physical distress.    Average METs 1.9      Resistance Training   Training Prescription No    Weight --   No weights done on Wednesday     Interval Training   Interval Training No      NuStep   Level 2    SPM 75    Minutes 30    METs 1.9      Home Exercise Plan   Plans to continue exercise at Home (comment)    Frequency Add 2 additional days to program exercise sessions.    Initial Home Exercises Provided 06/25/20           Nutrition:  Target Goals: Understanding of nutrition guidelines, daily intake of sodium 1500mg , cholesterol 200mg , calories 30% from fat and 7% or less from saturated fats, daily to have 5 or more servings of fruits and vegetables.  Biometrics:  Pre Biometrics - 06/12/20 1550      Pre Biometrics   Height 5' 7.5" (1.715 m)    Weight 115.7 kg    Waist Circumference 49 inches    Hip Circumference 47 inches    Waist to Hip Ratio 1.04 %    BMI (Calculated) 39.34    Triceps Skinfold 35 mm    % Body Fat 50.6 %    Grip Strength 34 kg    Flexibility 0 in    Single Leg Stand 2.1 seconds            Nutrition Therapy Plan and Nutrition Goals:  Nutrition Therapy & Goals - 06/26/20 0721      Nutrition Therapy   Diet Heart Healthy      Personal Nutrition Goals   Nutrition Goal Pt to identify food quantities necessary to achieve weight loss of 6-24 lb at graduation from cardiac rehab.    Personal Goal #2 Pt to build a healthy plate including vegetables, fruits, whole grains, and low-fat dairy products in a heart  healthy meal plan.    Personal Goal #3 Pt able to name foods that affect blood glucose      Intervention Plan   Intervention Prescribe, educate and counsel regarding individualized specific dietary modifications aiming towards targeted core components such as weight, hypertension, lipid management, diabetes, heart failure and other comorbidities.;Nutrition handout(s) given to patient.    Expected Outcomes Short Term Goal: A plan has been developed with personal nutrition goals set during dietitian appointment.;Long Term Goal: Adherence to prescribed nutrition plan.           Nutrition Assessments:   Nutrition Goals Re-Evaluation:   Nutrition Goals Re-Evaluation:   Nutrition Goals Discharge (Final Nutrition Goals Re-Evaluation):   Psychosocial: Target Goals: Acknowledge presence or absence of significant depression and/or stress, maximize coping skills, provide positive support system. Participant is able to verbalize types and ability to use techniques and skills needed for reducing stress and depression.  Initial Review & Psychosocial Screening:  Initial Psych Review & Screening - 06/12/20 1540      Initial Review   Current issues with Current Anxiety/Panic   Lucrezia says that she feels a little depressed at times but has the support of her sisters and [place]? Yes   Makeya lives alone but has her sisters who live nearby for support     Barriers   Psychosocial barriers  to participate in program The patient should benefit from training in stress management and relaxation.      Screening Interventions   Interventions Encouraged to exercise    Expected Outcomes Long Term Goal: Stressors or current issues are controlled or eliminated.;Short Term goal: Identification and review with participant of any Quality of Life or Depression concerns found by scoring the questionnaire.           Quality of Life Scores:  Quality of Life -  06/12/20 1552      Quality of Life   Select Quality of Life      Quality of Life Scores   Health/Function Pre 16.03 %    Socioeconomic Pre 23.86 %    Psych/Spiritual Pre 22.14 %    Family Pre 25.5 %    GLOBAL Pre 20.14 %          Scores of 19 and below usually indicate a poorer quality of life in these areas.  A difference of  2-3 points is a clinically meaningful difference.  A difference of 2-3 points in the total score of the Quality of Life Index has been associated with significant improvement in overall quality of life, self-image, physical symptoms, and general health in studies assessing change in quality of life.  PHQ-9: Recent Review Flowsheet Data    Depression screen [facility] 2/9 06/12/2020   Decreased Interest 1   Down, Depressed, Hopeless 0   PHQ - 2 Score 1     Interpretation of Total Score  Total Score Depression Severity:  1-4 = Minimal depression, 5-9 = Mild depression, 10-14 = Moderate depression, 15-19 = Moderately severe depression, 20-27 = Severe depression   Psychosocial Evaluation and Intervention:  Psychosocial Evaluation - 06/16/20 1654      Psychosocial Evaluation & Interventions   Interventions Stress management education;Relaxation education;Encouraged to exercise with the program and follow exercise prescription    Comments Ms. Heather has a history of anxiety and depression. Today she presents to cardiac rehab with a joyful spirit, positive attitude and outlook on her health. She is eager to participate in CR. She has a strong support system including her children. Depression symptoms are management at this time. Ms. Brummitt denied barriers to participation in CR or self health management.    Expected Outcomes Ms. Cosio will continue to have a positive attitude and outlook. She will self reflect and report any return of depression or anxiety symptoms.    Continue Psychosocial Services  Follow up required by staff           Psychosocial Re-Evaluation:   Psychosocial Re-Evaluation    [place] Name 06/24/20 1425             Psychosocial Re-Evaluation   Current issues with Current Anxiety/Panic       Comments Ms. Keefe has a history of anxiety and depression. She continues to presents to cardiac rehab with a joyful spirit, positive attitude and outlook on her health. She is eager to participate in CR. She has a strong support system including her children. Depression symptoms are management at this time. Ms. Dragovich denied barriers to participation in CR or self health management.       Expected Outcomes Ms. Gaspar will continue to have a positive attitude and outlook. She will self reflect and report any return of depression or anxiety symptoms.       Interventions Encouraged to attend Cardiac Rehabilitation for the exercise  Psychosocial Discharge (Final Psychosocial Re-Evaluation):  Psychosocial Re-Evaluation - 06/24/20 1425      Psychosocial Re-Evaluation   Current issues with Current Anxiety/Panic    Comments Ms. Jezewski has a history of anxiety and depression. She continues to presents to cardiac rehab with a joyful spirit, positive attitude and outlook on her health. She is eager to participate in CR. She has a strong support system including her children. Depression symptoms are management at this time. Ms. Harlacher denied barriers to participation in CR or self health management.    Expected Outcomes Ms. Streeter will continue to have a positive attitude and outlook. She will self reflect and report any return of depression or anxiety symptoms.    Interventions Encouraged to attend Cardiac Rehabilitation for the exercise           Vocational Rehabilitation: Provide vocational rehab assistance to qualifying candidates.   Vocational Rehab Evaluation & Intervention:  Vocational Rehab - 06/12/20 1544      Initial Vocational Rehab Evaluation & Intervention   Assessment shows need for Vocational Rehabilitation No   Mahayla works 3  days a week and does not need vocational rehab at this time          Education: Education Goals: Education classes will be provided on a weekly basis, covering required topics. Participant will state understanding/return demonstration of topics presented.  Learning Barriers/Preferences:  Learning Barriers/Preferences - 06/12/20 1553      Learning Barriers/Preferences   Learning Barriers None    Learning Preferences Written Material;Skilled Demonstration;Individual Instruction           Education Topics: Count Your Pulse:  -Group instruction provided by verbal instruction, demonstration, patient participation and written materials to support subject.  Instructors address importance of being able to find your pulse and how to count your pulse when at home without a heart monitor.  Patients get hands on experience counting their pulse with staff help and individually.   Heart Attack, Angina, and Risk Factor Modification:  -Group instruction provided by verbal instruction, video, and written materials to support subject.  Instructors address signs and symptoms of angina and heart attacks.    Also discuss risk factors for heart disease and how to make changes to improve heart health risk factors.   Functional Fitness:  -Group instruction provided by verbal instruction, demonstration, patient participation, and written materials to support subject.  Instructors address safety measures for doing things around the house.  Discuss how to get up and down off the floor, how to pick things up properly, how to safely get out of a chair without assistance, and balance training.   Meditation and Mindfulness:  -Group instruction provided by verbal instruction, patient participation, and written materials to support subject.  Instructor addresses importance of mindfulness and meditation practice to help reduce stress and improve awareness.  Instructor also leads participants through a meditation  exercise.    Stretching for Flexibility and Mobility:  -Group instruction provided by verbal instruction, patient participation, and written materials to support subject.  Instructors lead participants through series of stretches that are designed to increase flexibility thus improving mobility.  These stretches are additional exercise for major muscle groups that are typically performed during regular warm up and cool down.   Hands Only CPR:  -Group verbal, video, and participation provides a basic overview of AHA guidelines for community CPR. Role-play of emergencies allow participants the opportunity to practice calling for help and chest compression technique with discussion of AED use.  Hypertension: -Group verbal and written instruction that provides a basic overview of hypertension including the most recent diagnostic guidelines, risk factor reduction with self-care instructions and medication management.    Nutrition I class: Heart Healthy Eating:  -Group instruction provided by PowerPoint slides, verbal discussion, and written materials to support subject matter. The instructor gives an explanation and review of the Therapeutic Lifestyle Changes diet recommendations, which includes a discussion on lipid goals, dietary fat, sodium, fiber, plant stanol/sterol esters, sugar, and the components of a well-balanced, healthy diet.   Nutrition II class: Lifestyle Skills:  -Group instruction provided by PowerPoint slides, verbal discussion, and written materials to support subject matter. The instructor gives an explanation and review of label reading, grocery shopping for heart health, heart healthy recipe modifications, and ways to make healthier choices when eating out.   Diabetes Question & Answer:  -Group instruction provided by PowerPoint slides, verbal discussion, and written materials to support subject matter. The instructor gives an explanation and review of diabetes  co-morbidities, pre- and post-prandial blood glucose goals, pre-exercise blood glucose goals, signs, symptoms, and treatment of hypoglycemia and hyperglycemia, and foot care basics.   Diabetes Blitz:  -Group instruction provided by PowerPoint slides, verbal discussion, and written materials to support subject matter. The instructor gives an explanation and review of the physiology behind type 1 and type 2 diabetes, diabetes medications and rational behind using different medications, pre- and post-prandial blood glucose recommendations and Hemoglobin A1c goals, diabetes diet, and exercise including blood glucose guidelines for exercising safely.    Portion Distortion:  -Group instruction provided by PowerPoint slides, verbal discussion, written materials, and food models to support subject matter. The instructor gives an explanation of serving size versus portion size, changes in portions sizes over the last 20 years, and what consists of a serving from each food group.   Stress Management:  -Group instruction provided by verbal instruction, video, and written materials to support subject matter.  Instructors review role of stress in heart disease and how to cope with stress positively.     Exercising on Your Own:  -Group instruction provided by verbal instruction, power point, and written materials to support subject.  Instructors discuss benefits of exercise, components of exercise, frequency and intensity of exercise, and end points for exercise.  Also discuss use of nitroglycerin and activating EMS.  Review options of places to exercise outside of rehab.  Review guidelines for sex with heart disease.   Cardiac Drugs I:  -Group instruction provided by verbal instruction and written materials to support subject.  Instructor reviews cardiac drug classes: antiplatelets, anticoagulants, beta blockers, and statins.  Instructor discusses reasons, side effects, and lifestyle considerations for each  drug class.   Cardiac Drugs II:  -Group instruction provided by verbal instruction and written materials to support subject.  Instructor reviews cardiac drug classes: angiotensin converting enzyme inhibitors (ACE-I), angiotensin II receptor blockers (ARBs), nitrates, and calcium channel blockers.  Instructor discusses reasons, side effects, and lifestyle considerations for each drug class.   Anatomy and Physiology of the Circulatory System:  Group verbal and written instruction and models provide basic cardiac anatomy and physiology, with the coronary electrical and arterial systems. Review of: AMI, Angina, Valve disease, Heart Failure, Peripheral Artery Disease, Cardiac Arrhythmia, Pacemakers, and the ICD.   Other Education:  -Group or individual verbal, written, or video instructions that support the educational goals of the cardiac rehab program.   Holiday Eating Survival Tips:  -Group instruction provided by PowerPoint slides, verbal discussion,  and written materials to support subject matter. The instructor gives patients tips, tricks, and techniques to help them not only survive but enjoy the holidays despite the onslaught of food that accompanies the holidays.   Knowledge Questionnaire Score:  Knowledge Questionnaire Score - 06/12/20 1551      Knowledge Questionnaire Score   Pre Score 19/24           Core Components/Risk Factors/Patient Goals at Admission:  Personal Goals and Risk Factors at Admission - 06/12/20 1611      Core Components/Risk Factors/Patient Goals on Admission    Weight Management Obesity;Yes    Intervention Weight Management: Provide education and appropriate resources to help participant work on and attain dietary goals.;Weight Management: Develop a combined nutrition and exercise program designed to reach desired caloric intake, while maintaining appropriate intake of nutrient and fiber, sodium and fats, and appropriate energy expenditure required for the  weight goal.;Weight Management/Obesity: Establish reasonable short term and long term weight goals.;Obesity: Provide education and appropriate resources to help participant work on and attain dietary goals.    Admit Weight 255 lb 1.2 oz (115.7 kg)    Goal Weight: Long Term 220 lb (99.8 kg)    Expected Outcomes Short Term: Continue to assess and modify interventions until short term weight is achieved;Long Term: Adherence to nutrition and physical activity/exercise program aimed toward attainment of established weight goal;Understanding recommendations for meals to include 15-35% energy as protein, 25-35% energy from fat, 35-60% energy from carbohydrates, less than 200mg  of dietary cholesterol, 20-35 gm of total fiber daily;Weight Loss: Understanding of general recommendations for a balanced deficit meal plan, which promotes 1-2 lb weight loss per week and includes a negative energy balance of [phone] kcal/d;Understanding of distribution of calorie intake throughout the day with the consumption of 4-5 meals/snacks    Diabetes Yes    Intervention Provide education about signs/symptoms and action to take for hypo/hyperglycemia.;Provide education about proper nutrition, including hydration, and aerobic/resistive exercise prescription along with prescribed medications to achieve blood glucose in normal ranges: Fasting glucose 65-99 mg/dL    Expected Outcomes Short Term: Participant verbalizes understanding of the signs/symptoms and immediate care of hyper/hypoglycemia, proper foot care and importance of medication, aerobic/resistive exercise and nutrition plan for blood glucose control.;Long Term: Attainment of HbA1C < 7%.    Hypertension Yes    Intervention Provide education on lifestyle modifcations including regular physical activity/exercise, weight management, moderate sodium restriction and increased consumption of fresh fruit, vegetables, and low fat dairy, alcohol moderation, and smoking  cessation.;Monitor prescription use compliance.    Expected Outcomes Short Term: Continued assessment and intervention until BP is < 140/6mm HG in hypertensive participants. < 130/67mm HG in hypertensive participants with diabetes, heart failure or chronic kidney disease.;Long Term: Maintenance of blood pressure at goal levels.    Lipids Yes    Intervention Provide education and support for participant on nutrition & aerobic/resistive exercise along with prescribed medications to achieve LDL 70mg , HDL >40mg .    Expected Outcomes Short Term: Participant states understanding of desired cholesterol values and is compliant with medications prescribed. Participant is following exercise prescription and nutrition guidelines.;Long Term: Cholesterol controlled with medications as prescribed, with individualized exercise RX and with personalized nutrition plan. Value goals: LDL < 70mg , HDL > 40 mg.    Stress Yes    Intervention Offer individual and/or small group education and counseling on adjustment to heart disease, stress management and health-related lifestyle change. Teach and support self-help strategies.;Refer participants experiencing significant psychosocial distress to  appropriate mental health specialists for further evaluation and treatment. When possible, include family members and significant others in education/counseling sessions.    Expected Outcomes Short Term: Participant demonstrates changes in health-related behavior, relaxation and other stress management skills, ability to obtain effective social support, and compliance with psychotropic medications if prescribed.;Long Term: Emotional wellbeing is indicated by absence of clinically significant psychosocial distress or social isolation.           Core Components/Risk Factors/Patient Goals Review:   Goals and Risk Factor Review    Row Name 06/16/20 1658 06/24/20 1426           Core Components/Risk Factors/Patient Goals Review    Personal Goals Review Weight Management/Obesity;Lipids;Diabetes;Stress;Hypertension Weight Management/Obesity;Lipids;Diabetes;Stress;Hypertension      Review Ms Derderian has multiple CAD risk factors. She is eager to participate in cardiac rehab for modifications and exercise routien. Her personal goals are to increase her stamina and strength, decrease her anxiety, and improve her diet. She also hopes to improve her coping skills. Ms Silos has multiple CAD risk factors. She is eager to participate in cardiac rehab for modifications and exercise routien. Her personal goals are to increase her stamina and strength, decrease her anxiety, and improve her diet. She also hopes to improve her coping skills.      Expected Outcomes Patient will continue to participate in CR for risk factor modifications and exercise. Patient will continue to participate in CR for risk factor modifications and exercise.             Core Components/Risk Factors/Patient Goals at Discharge (Final Review):   Goals and Risk Factor Review - 06/24/20 1426      Core Components/Risk Factors/Patient Goals Review   Personal Goals Review Weight Management/Obesity;Lipids;Diabetes;Stress;Hypertension    Review Ms Garmon has multiple CAD risk factors. She is eager to participate in cardiac rehab for modifications and exercise routien. Her personal goals are to increase her stamina and strength, decrease her anxiety, and improve her diet. She also hopes to improve her coping skills.    Expected Outcomes Patient will continue to participate in CR for risk factor modifications and exercise.           ITP Comments:  ITP Comments    Row Name 06/12/20 1541 06/12/20 1549 06/16/20 1651 06/24/20 1421     ITP Comments Dr. Fransico Him, Medical Director Dr. Fransico Him MD, Medical Director Nevin Bloodgood P. Deremer completed her first cardiac rehab exercise session today and tolerated well. VSS. Patient denied complaints. Worked to an RPE of 11-12. Ms.  Garbett' goals are to increase her stamina and strength, decrease anxiety, and improve her diet. Long term she hopes to increase her coping skills. 30 day ITP review: Ms Tellefsen has completed 3 cardiac rehab exercise sessions since admission. VSS. Denies complaints. Workes to an RPE of 11-13. Goals are to increase stamina and strength, decrease her anxiety about her health, and to improve her diet. It is too early in the program to evaluate progression towards goals. Will reassess in 30 days.           Comments: see ITP comments

## 2020-06-25 ENCOUNTER — Encounter (HOSPITAL_COMMUNITY)
Admission: RE | Admit: 2020-06-25 | Discharge: 2020-06-25 | Disposition: A | Discharge status: Home / Self Care | Payer: PPO | Source: Ambulatory Visit | Attending: Cardiology | Admitting: Cardiology

## 2020-06-25 ENCOUNTER — Other Ambulatory Visit: Payer: Self-pay

## 2020-06-25 DIAGNOSIS — Z955 Presence of coronary angioplasty implant and graft: Secondary | ICD-10-CM | POA: Diagnosis not present

## 2020-06-25 DIAGNOSIS — I4892 Unspecified atrial flutter: Secondary | ICD-10-CM | POA: Diagnosis not present

## 2020-06-25 DIAGNOSIS — I2510 Atherosclerotic heart disease of native coronary artery without angina pectoris: Secondary | ICD-10-CM | POA: Diagnosis not present

## 2020-06-25 DIAGNOSIS — Z7901 Long term (current) use of anticoagulants: Secondary | ICD-10-CM | POA: Diagnosis not present

## 2020-06-26 ENCOUNTER — Encounter: Payer: Self-pay | Admitting: Cardiology

## 2020-06-26 ENCOUNTER — Ambulatory Visit: Payer: PPO | Admitting: Cardiology

## 2020-06-26 VITALS — BP 126/56 | HR 64 | Resp 17 | Ht 67.0 in | Wt 253.0 lb

## 2020-06-26 DIAGNOSIS — E782 Mixed hyperlipidemia: Secondary | ICD-10-CM | POA: Diagnosis not present

## 2020-06-26 DIAGNOSIS — I4892 Unspecified atrial flutter: Secondary | ICD-10-CM | POA: Diagnosis not present

## 2020-06-26 DIAGNOSIS — I10 Essential (primary) hypertension: Secondary | ICD-10-CM | POA: Diagnosis not present

## 2020-06-26 DIAGNOSIS — I2510 Atherosclerotic heart disease of native coronary artery without angina pectoris: Secondary | ICD-10-CM

## 2020-06-26 DIAGNOSIS — E119 Type 2 diabetes mellitus without complications: Secondary | ICD-10-CM

## 2020-06-26 DIAGNOSIS — Z7901 Long term (current) use of anticoagulants: Secondary | ICD-10-CM

## 2020-06-26 DIAGNOSIS — Z955 Presence of coronary angioplasty implant and graft: Secondary | ICD-10-CM

## 2020-06-26 LAB — BASIC METABOLIC PANEL
BUN/Creatinine Ratio: 15 (ref 12–28)
BUN: 15 mg/dL (ref 8–27)
CO2: 21 mmol/L (ref 20–29)
Calcium: 10.2 mg/dL (ref 8.7–10.3)
Chloride: 103 mmol/L (ref 96–106)
Creatinine, Ser: 0.98 mg/dL (ref 0.57–1.00)
GFR calc Af Amer: 69 mL/min/{1.73_m2} (ref >59)
GFR calc non Af Amer: 60 mL/min/{1.73_m2} (ref >59)
Glucose: 158 mg/dL — ABNORMAL HIGH (ref 65–99)
Potassium: 4.5 mmol/L (ref 3.5–5.2)
Sodium: 141 mmol/L (ref 134–144)

## 2020-06-26 LAB — LIPID PANEL WITH LDL/HDL RATIO
Cholesterol, Total: 154 mg/dL (ref 100–199)
HDL: 55 mg/dL (ref >39)
LDL Chol Calc (NIH): 68 mg/dL (ref 0–99)
LDL/HDL Ratio: 1.2 ratio (ref 0.0–3.2)
Triglycerides: 188 mg/dL — ABNORMAL HIGH (ref 0–149)
VLDL Cholesterol Cal: 31 mg/dL (ref 5–40)

## 2020-06-26 LAB — CBC
Hematocrit: 37.1 % (ref 34.0–46.6)
Hemoglobin: 12.6 g/dL (ref 11.1–15.9)
MCH: 31.2 pg (ref 26.6–33.0)
MCHC: 34 g/dL (ref 31.5–35.7)
MCV: 92 fL (ref 79–97)
Platelets: 251 10*3/uL (ref 150–450)
RBC: 4.04 x10E6/uL (ref 3.77–5.28)
RDW: 12.5 % (ref 11.7–15.4)
WBC: 8.8 10*3/uL (ref 3.4–10.8)

## 2020-06-26 LAB — LDL CHOLESTEROL, DIRECT: LDL Direct: 68 mg/dL (ref 0–99)

## 2020-06-26 LAB — MAGNESIUM: Magnesium: 1.9 mg/dL (ref 1.6–2.3)

## 2020-06-26 MED ORDER — NITROGLYCERIN 0.4 MG SL SUBL: 0.4000 mg | SUBLINGUAL_TABLET | SUBLINGUAL | 0 refills | Status: AC | PRN

## 2020-06-26 NOTE — Progress Notes (Signed)
[name] Date of Birth: 06/22/1951 MRN: [id number] Primary Care Provider:[place], [place] Reader, [place] Primary Cardiologist: Rex Kras, DO  (established care 02/04/2020).   Date: 06/26/2020 Last Office Visit: 03/26/2020  Chief Complaint  Patient presents with  . Hypertension  . Follow-up    3 month    HPI  [name] is a 68 y.o. female who presents to the office with a chief complaint of " hypertension, CAD, atrial flutter follow-up." Patient's past medical history and cardiac risk factors include: Coronary artery disease status post angioplasty and stent, hypertension, hyperlipidemia, hypothyroidism, GERD, postmenopausal female, advanced age, non-insulin-dependent diabetes mellitus type 2, Paroxysmal atrial flutter during stress test.  Patient was referred to the office in April 2021 at the request of her primary care provider for evaluation of abnormal EKG and worsening cardiovascular risk factors.  Patient had undergone extensive evaluation given her symptoms cardiovascular risk factors.  Her stress test was reported to be abnormal with reversible ischemia in the LAD distribution.  Her coronary CTA was CT FFR was also reported to be abnormal in the LAD distribution. She underwent left heart catheterization on March 18, 2020 and noted to have obstructive CAD in the LAD distribution and underwent PTCA and stent placement in the mid and proximal LAD distribution.  Report noted below for further reference.    Since last office visit patient is doing well from a cardiovascular standpoint.  She denies any chest pain or shortness of breath at rest or with effort related activities.  She is going to cardiac rehab 3 times a week and is making good progress.  She is compliant with her medical therapy without any side effects or intolerances.  Denies prior history of myocardial infarction, congestive heart failure, deep venous thrombosis, pulmonary embolism, stroke, transient ischemic  attack.  FUNCTIONAL STATUS: She tries to walk at least 20 minutes a day as weather permitting.  ALLERGIES: Allergies  Allergen Reactions  . Benazepril Cough  . Ciprofloxacin Itching  . Naproxen Sodium Other (See Comments)    Stomach burns    MEDICATION LIST PRIOR TO VISIT: Current Outpatient Medications on File Prior to Visit  Medication Sig Dispense Refill  . acetaminophen (TYLENOL) 500 MG tablet Take 1,000 mg by mouth every 6 (six) hours as needed for mild pain or headache.    Marland Kitchen amLODipine (NORVASC) 10 MG tablet Take 10 mg by mouth daily.     Marland Kitchen apixaban (ELIQUIS) 5 MG TABS tablet Take 5 mg by mouth 2 (two) times daily.    Marland Kitchen atorvastatin (LIPITOR) 80 MG tablet Take 1 tablet (80 mg total) by mouth daily. 90 tablet 1  . clopidogrel (PLAVIX) 75 MG tablet Take 1 tablet (75 mg total) by mouth daily with breakfast. 90 tablet 1  . escitalopram (LEXAPRO) 20 MG tablet Take 20 mg by mouth daily.    Marland Kitchen estradiol (ESTRACE) 1 MG tablet Take 1 mg by mouth daily.     Marland Kitchen ezetimibe (ZETIA) 10 MG tablet Take 10 mg by mouth daily.    . hydrochlorothiazide (MICROZIDE) 12.5 MG capsule Take 1 capsule by mouth once daily in the morning 90 capsule 0  . ibuprofen (ADVIL) 200 MG tablet Take 200 mg by mouth every 6 (six) hours as needed.    . irbesartan (AVAPRO) 300 MG tablet Take 300 mg by mouth daily.    Marland Kitchen levothyroxine (SYNTHROID, LEVOTHROID) 100 MCG tablet Take 100 mcg by mouth at bedtime.     . medroxyPROGESTERone (PROVERA) 5 MG tablet Take  5 mg by mouth daily.    . pantoprazole (PROTONIX) 40 MG tablet Take 1 tablet (40 mg total) by mouth daily. 90 tablet 1  . metoprolol succinate (TOPROL XL) 50 MG 24 hr tablet Take 1 tablet (50 mg total) by mouth in the morning. Hold if systolic blood pressure (top blood pressure number) less than 100 mmHg or heart rate less than 60 bpm (pulse). 90 tablet 1   No current facility-administered medications on file prior to visit.    PAST MEDICAL HISTORY: Past Medical  History:  Diagnosis Date  . Arthritis   . Coronary artery disease   . Depression   . Diabetes mellitus without complication ([place])   . GERD (gastroesophageal reflux disease)   . Hypercholesteremia   . Hypertension   . Hypothyroidism   . Pre-diabetes 08/19/2019  . Sleep apnea    mild-modreate-tried cpap    PAST SURGICAL HISTORY: Past Surgical History:  Procedure Laterality Date  . CARPAL TUNNEL RELEASE     right  . COLONOSCOPY    . CORONARY ANGIOPLASTY WITH STENT PLACEMENT    . CORONARY STENT INTERVENTION N/A 03/18/2020   Procedure: CORONARY STENT INTERVENTION;  Surgeon: Nigel Mormon, MD;  Location: [facility];  Service: Cardiovascular;  Laterality: N/A;  . DILATION AND CURETTAGE OF UTERUS    . INTRAVASCULAR ULTRASOUND/IVUS N/A 03/18/2020   Procedure: Intravascular Ultrasound/IVUS;  Surgeon: Nigel Mormon, MD;  Location: [facility];  Service: Cardiovascular;  Laterality: N/A;  . LEFT HEART CATH AND CORONARY ANGIOGRAPHY N/A 03/18/2020   Procedure: LEFT HEART CATH AND CORONARY ANGIOGRAPHY;  Surgeon: Nigel Mormon, MD;  Location: [facility];  Service: Cardiovascular;  Laterality: N/A;  . ORIF ANKLE FRACTURE  04/13/2012   Procedure: OPEN REDUCTION INTERNAL FIXATION (ORIF) ANKLE FRACTURE;  Surgeon: Wylene Simmer, MD;  Location: [facility];  Service: Orthopedics;  Laterality: Left;  . TONSILLECTOMY      FAMILY HISTORY: The patient family history includes Breast cancer in her sister; Colon cancer in her father; Dementia in her mother; Heart attack in her father; Hypertension in her sister, sister, sister, and sister.   SOCIAL HISTORY:  The patient  reports that she has never smoked. She has never used smokeless tobacco. She reports current alcohol use. She reports that she does not use drugs.  REVIEW OF SYSTEMS: Review of Systems  Constitutional: Negative for chills, fever and malaise/fatigue.  HENT: Negative for ear discharge, ear  pain and nosebleeds.   Eyes: Negative for blurred vision and discharge.  Cardiovascular: Negative for chest pain, claudication, dyspnea on exertion, leg swelling, near-syncope, orthopnea, palpitations, paroxysmal nocturnal dyspnea and syncope.  Respiratory: Negative for cough and shortness of breath.   Endocrine: Negative for polydipsia, polyphagia and polyuria.  Hematologic/Lymphatic: Negative for bleeding problem.  Skin: Negative for flushing and nail changes.  Musculoskeletal: Negative for muscle cramps, muscle weakness and myalgias.  Gastrointestinal: Negative for abdominal pain, dysphagia, hematemesis, hematochezia, melena, nausea and vomiting.  Neurological: Negative for dizziness, focal weakness and light-headedness.   PHYSICAL EXAM: Vitals with BMI 06/26/2020 06/12/2020 03/26/2020  Height 5\' 7"  5' 7.5" 5\' 8"   Weight 253 lbs 255 lbs 1 oz 249 lbs  BMI 39.62 25.05 39.76  Systolic 734 193 790  Diastolic 56 74 62  Pulse 64 75 68    CONSTITUTIONAL: Well-developed and well-nourished. No acute distress.  SKIN: Skin is warm and dry. No rash noted. No cyanosis. No pallor. No jaundice HEAD: Normocephalic and atraumatic.  EYES: No scleral icterus MOUTH/THROAT: Moist oral membranes.  NECK: No JVD present. No thyromegaly noted. No carotid bruits  LYMPHATIC: No visible cervical adenopathy.  CHEST Normal respiratory effort. No intercostal retractions  LUNGS: Clear to auscultation bilaterally.  No stridor. No wheezes. No rales.  CARDIOVASCULAR: Regular rate and rhythm, positive S1-S2, no murmurs rubs or gallops appreciated.   ABDOMINAL: Obese, soft, nontender, nondistended, positive bowel sounds in all 4 quadrants. No apparent ascites.  EXTREMITIES: No peripheral edema. Radial site is healing well without hematoma / bruit.   HEMATOLOGIC: No significant bruising NEUROLOGIC: Oriented to person, place, and time. Nonfocal. Normal muscle tone.  PSYCHIATRIC: Normal mood and affect. Normal behavior.  Cooperative  CARDIAC DATABASE: EKG: 03/26/2020: Normal sinus rhythm, 63 bpm, normal axis, LVH per voltage criteria, ST-T changes most likely secondary to underlying LVH.  No significant change compared to prior EKG 06/26/2020: Normal sinus rhythm, 64bpm, normal axis, LVH per voltage criteria, ST-T changes most likely secondary to LVH, no underlying injury pattern.  No significant change compared to prior ECG.  Echocardiogram: 02/22/2020: LVEF 59%, moderate concentric LVH, grade 2 diastolic dysfunction, severely dilated left atrium, mild TR.   Stress Testing: Lexiscan (Walking with mod Bruce)Tetrofosmin Stress Test  02/18/2020: Resting EKG/ECG demonstrated atypical atrial flutter with rapid ventricular response. Observed left ventricular hypertrophy with secondary ST-T changes, repolarization abnormality. Peak EKG/ECG revealed no significant ST-T change from baseline abnormality. Patient achieved 101% of MPHR with modified Bruce protocol.  During infusion the recovery ECG revealed occasional premature  There is a reversible mild defect in the distal anterior and apical regions.  Overall LV systolic function is normal with regional wall motion abnormalities in the same region. Stress LV EF: 55%.  Low risk in view of size of defect and preserved LVEF.    Coronary CTA w/ FFR.  03/07/2020:  1. Mild non-obstructive CAD of the proximal and mid-LAD and proximal LCx, CADRADS = 2. The mid-LAD stenosis may be more significant,however, there is blooming and alignment artifact. Will submit study for FFR, given the abnormal myoview results in the distal anterior and apical regions. 2. Coronary calcium score of 148. This was 82nd percentile for age and sex matched control. 3. Normal coronary origin with right dominance. 4. Dilated main pulmonary artery to 37 mm, suggesting pulmonary hypertension. 5.  Aortic atherosclerosis CT FFR ANALYSIS 1. CT FFR demonstrates flow-limiting stenosis of the proximal to  mid-LAD, just upstream of the D1 branch, affecting the mid to distal LAD and a large D2 branch. LAD: Significant stenosis. Proximal FFR = 0.96, Mid FFR = 0.66, Distal FFR = 0.60  Heart Catheterization: March 18, 2020: LM: Normal LAD: Prox 75-80% stenosis. Mild calcification.        Mid 75-80% stenosis. Mild calcification.        Prox D1 50% stenosis. LCx: Prox 20% stenosis. RCA: Prox 20% stenosis.       PTCA and stent placement 2.5 X 12 mm Resolute Onyx drug-eluting stent mid LAD     PTCA and stent placement 3.5 X 18 mm Resolute Onyx drug-eluting stent prox LAD  LABORATORY DATA: BMP Latest Ref Rng & Units 06/25/2020 03/19/2020 02/29/2020  Glucose 65 - 99 mg/dL 158(H) 132(H) 124(H)  BUN 8 - 27 mg/dL 15 12 10   Creatinine 0.57 - 1.00 mg/dL 0.98 0.80 0.77  BUN/Creat Ratio 12 - 28 15 - 13  Sodium 134 - 144 mmol/L 141 137 141  Potassium 3.5 - 5.2 mmol/L 4.5 3.9 4.6  Chloride 96 - 106 mmol/L  103 107 104  CO2 20 - 29 mmol/L 21 22 22   Calcium 8.7 - 10.3 mg/dL 10.2 8.9 9.9   CBC Latest Ref Rng & Units 06/25/2020 03/19/2020 02/29/2020  WBC 3.4 - 10.8 x10E3/uL 8.8 7.7 8.6  Hemoglobin 11.1 - 15.9 g/dL 12.6 11.5(L) 13.3  Hematocrit 34.0 - 46.6 % 37.1 37.4 39.8  Platelets 150 - 450 x10E3/uL 251 199 275   Lipid Panel     Component Value Date/Time   CHOL 154 06/25/2020 0831   TRIG 188 (H) 06/25/2020 0831   HDL 55 06/25/2020 0831   LDLCALC 68 06/25/2020 0831   LDLDIRECT 68 06/25/2020 0831   LABVLDL 31 06/25/2020 0831   Lipid profile: 07/11/2019: Total cholesterol 203, triglycerides 208, HDL 56, LDL 113 01/07/2020: Total cholesterol 231, triglycerides 303, HDL 55, LDL 123  Hemoglobin A1c: 07/11/2019: 6.6 01/07/2020: 6.6  TSH:  01/01/2019 1.62  Serum creatinine: 01/07/2020 0.98 mg/dL  FINAL MEDICATION LIST END OF ENCOUNTER: Meds ordered this encounter  Medications  . nitroGLYCERIN (NITROSTAT) 0.4 MG SL tablet    Sig: Place 1 tablet (0.4 mg total) under the tongue every 5 (five) minutes as  needed for chest pain. If you require more than two tablets five minutes apart go to the nearest ER via EMS.    Dispense:  30 tablet    Refill:  0     Current Outpatient Medications:  .  acetaminophen (TYLENOL) 500 MG tablet, Take 1,000 mg by mouth every 6 (six) hours as needed for mild pain or headache., Disp: , Rfl:  .  amLODipine (NORVASC) 10 MG tablet, Take 10 mg by mouth daily. , Disp: , Rfl:  .  apixaban (ELIQUIS) 5 MG TABS tablet, Take 5 mg by mouth 2 (two) times daily., Disp: , Rfl:  .  atorvastatin (LIPITOR) 80 MG tablet, Take 1 tablet (80 mg total) by mouth daily., Disp: 90 tablet, Rfl: 1 .  clopidogrel (PLAVIX) 75 MG tablet, Take 1 tablet (75 mg total) by mouth daily with breakfast., Disp: 90 tablet, Rfl: 1 .  escitalopram (LEXAPRO) 20 MG tablet, Take 20 mg by mouth daily., Disp: , Rfl:  .  estradiol (ESTRACE) 1 MG tablet, Take 1 mg by mouth daily. , Disp: , Rfl:  .  ezetimibe (ZETIA) 10 MG tablet, Take 10 mg by mouth daily., Disp: , Rfl:  .  hydrochlorothiazide (MICROZIDE) 12.5 MG capsule, Take 1 capsule by mouth once daily in the morning, Disp: 90 capsule, Rfl: 0 .  ibuprofen (ADVIL) 200 MG tablet, Take 200 mg by mouth every 6 (six) hours as needed., Disp: , Rfl:  .  irbesartan (AVAPRO) 300 MG tablet, Take 300 mg by mouth daily., Disp: , Rfl:  .  levothyroxine (SYNTHROID, LEVOTHROID) 100 MCG tablet, Take 100 mcg by mouth at bedtime. , Disp: , Rfl:  .  medroxyPROGESTERone (PROVERA) 5 MG tablet, Take 5 mg by mouth daily., Disp: , Rfl:  .  pantoprazole (PROTONIX) 40 MG tablet, Take 1 tablet (40 mg total) by mouth daily., Disp: 90 tablet, Rfl: 1 .  metoprolol succinate (TOPROL XL) 50 MG 24 hr tablet, Take 1 tablet (50 mg total) by mouth in the morning. Hold if systolic blood pressure (top blood pressure number) less than 100 mmHg or heart rate less than 60 bpm (pulse)., Disp: 90 tablet, Rfl: 1 .  nitroGLYCERIN (NITROSTAT) 0.4 MG SL tablet, Place 1 tablet (0.4 mg total) under the tongue  every 5 (five) minutes as needed for chest pain. If you require more  than two tablets five minutes apart go to the nearest ER via EMS., Disp: 30 tablet, Rfl: 0  IMPRESSION:    ICD-10-CM   1. Paroxysmal atrial flutter (HCC)  I48.92 EKG 12-Lead  2. Long term (current) use of anticoagulants  Z79.01   3. Atherosclerosis of native coronary artery of native heart without angina pectoris  I25.10 nitroGLYCERIN (NITROSTAT) 0.4 MG SL tablet    Lipid Panel With LDL/HDL Ratio    Lipid Panel With LDL/HDL Ratio  4. Essential hypertension  I10   5. History of coronary angioplasty with insertion of stent  Z95.5   6. Type 2 diabetes mellitus without complication, without long-term current use of insulin (HCC)  E11.9   7. Mixed hyperlipidemia  E78.2 Lipid Panel With LDL/HDL Ratio    Lipid Panel With LDL/HDL Ratio     RECOMMENDATIONS: [name] is a 69 y.o. female whose past medical history and cardiac risk factors include: Coronary artery disease status post angioplasty and stent to the proximal and mid LAD, hypertension, hyperlipidemia, hypothyroidism, GERD, postmenopausal female, advanced age, non-insulin-dependent diabetes mellitus type 2, paroxysmal atrial flutter during stress test.  Atherosclerosis of the native coronary arteries without angina pectoris and prior angioplasty and stenting:  Continue clopidogrel.  Not on dual antiplatelet therapy as she is on Eliquis for paroxysmal atrial flutter.  Continue beta-blocker therapy  Continue ARB therapy  Continue high intensity statin and Zetia  Continue cardiac rehab.  We will check lipid profile prior to the next office visit  Patient is requesting a prescription for sublingual nitroglycerin tablets to use for as needed basis.  Independently reviewed the labs from 06/25/2020.  Serum creatinine and electrolytes within normal limits.  Patient's lipid profile in general has improved compared to prior.  Her triglyceride levels are currently not  at goal.  Patient would like to work on lifestyle modifications and to improve her diet prior to initiating pharmacological therapy.  We will recheck lipid profile prior to next office visit and reassess lifestyle changes.  Paroxysmal atrial flutter: . Rate control: Metoprolol succinate. Marland Kitchen Rhythm control: N/A. Marland Kitchen Thromboembolic prophylaxis: Eliquis.   . CHA2DS2-VASc SCORE is 5 which correlates to 6.7% risk of stroke per year.  . Reemphasized the benefits, risks, and alternatives of anticoagulation.   Long-term oral anticoagulation:  Indication paroxysmal atrial flutter.  No prior history of intracranial bleeding or internal bleeding.  No recent surgeries.  Hemoglobin at the time [facility] was initiated was 13.3 g/dL.  Most recent hemoglobin 12.6 g/dL (06/25/2020)  Benign essential hypertension: . Office blood pressure is at goal.  . Medication reconciled.  . Low salt diet recommended. A diet that is rich in fruits, vegetables, legumes, and low-fat dairy products and low in snacks, sweets, and meats (such as the Dietary Approaches to Stop Hypertension [DASH] diet).   Mixed hyperlipidemia:  Continue Lipitor and Zetia.  Lipid profile prior to the next office visit  Currently managed by primary team.  Non-insulin-dependent diabetes mellitus type 2: Currently managed by primary team.  Obesity, due to excess calories: Body mass index is 39.63 kg/m. . I reviewed with the patient the importance of diet, regular physical activity/exercise, weight loss.   . Patient is educated on increasing physical activity gradually as tolerated.  With the goal of moderate intensity exercise for 30 minutes a day 5 days a week.  Orders Placed This Encounter  Procedures  . Lipid Panel With LDL/HDL Ratio  . EKG 12-Lead   --Continue cardiac medications as reconciled in final  medication list. --Return in about 6 months (around 12/24/2020) for Reevaluation of, CAD, Atrial flutter, Lipid. Or sooner if  needed. --Continue follow-up with your primary care physician regarding the management of your other chronic comorbid conditions.  Patient's questions and concerns were addressed to her satisfaction. She voices understanding of the instructions provided during this encounter.   This note was created using a voice recognition software as a result there may be grammatical errors inadvertently enclosed that do not reflect the nature of this encounter. Every attempt is made to correct such errors.  Rex Kras, [place], [facility]  Pager: [phone] Office: [phone]

## 2020-06-26 NOTE — Progress Notes (Signed)
Reviewed home exercise Rx with patient today. Pt is not currently exercising at home. Rationale for home exercise reviewed. Pt does not want to walk outdoors due to the weather, but does have a cycle ergometer which she will use at home 2x/week for 30 minutes. We reviewed warm-up an cool-down, THRR range, and the importance of hydration before, during and after exercise. Pt encouraged to carry cell phone if she does walk outdoors. We also reviewed weather parameters for temperature and humidity for exercise outdoors. Reviewed S/S to terminate exercise and when to call MD vs 911. Pt verbalized understanding of the home exercise Rx and was provided a copy.   Lesly Rubenstein MS, ACSM-EP-C, CCRP

## 2020-06-27 ENCOUNTER — Encounter (HOSPITAL_COMMUNITY)
Admission: RE | Admit: 2020-06-27 | Discharge: 2020-06-27 | Disposition: A | Discharge status: Home / Self Care | Payer: PPO | Source: Ambulatory Visit | Attending: Cardiology | Admitting: Cardiology

## 2020-06-27 ENCOUNTER — Other Ambulatory Visit: Payer: Self-pay

## 2020-06-27 DIAGNOSIS — Z955 Presence of coronary angioplasty implant and graft: Secondary | ICD-10-CM | POA: Diagnosis not present

## 2020-06-30 ENCOUNTER — Encounter (HOSPITAL_COMMUNITY): Payer: PPO

## 2020-07-02 ENCOUNTER — Encounter (HOSPITAL_COMMUNITY)
Admission: RE | Admit: 2020-07-02 | Discharge: 2020-07-02 | Disposition: A | Discharge status: Home / Self Care | Payer: PPO | Source: Ambulatory Visit | Attending: Cardiology | Admitting: Cardiology

## 2020-07-02 ENCOUNTER — Other Ambulatory Visit: Payer: Self-pay

## 2020-07-02 DIAGNOSIS — Z955 Presence of coronary angioplasty implant and graft: Secondary | ICD-10-CM | POA: Diagnosis not present

## 2020-07-04 ENCOUNTER — Encounter (HOSPITAL_COMMUNITY)
Admission: RE | Admit: 2020-07-04 | Discharge: 2020-07-04 | Disposition: A | Discharge status: Home / Self Care | Payer: PPO | Source: Ambulatory Visit | Attending: Cardiology | Admitting: Cardiology

## 2020-07-04 ENCOUNTER — Other Ambulatory Visit: Payer: Self-pay

## 2020-07-04 DIAGNOSIS — Z955 Presence of coronary angioplasty implant and graft: Secondary | ICD-10-CM | POA: Diagnosis not present

## 2020-07-07 ENCOUNTER — Other Ambulatory Visit: Payer: Self-pay

## 2020-07-07 ENCOUNTER — Encounter (HOSPITAL_COMMUNITY)
Admission: RE | Admit: 2020-07-07 | Discharge: 2020-07-07 | Disposition: A | Discharge status: Home / Self Care | Payer: PPO | Source: Ambulatory Visit | Attending: Cardiology | Admitting: Cardiology

## 2020-07-07 DIAGNOSIS — E7800 Pure hypercholesterolemia, unspecified: Secondary | ICD-10-CM | POA: Diagnosis not present

## 2020-07-07 DIAGNOSIS — R7303 Prediabetes: Secondary | ICD-10-CM | POA: Diagnosis not present

## 2020-07-07 DIAGNOSIS — E039 Hypothyroidism, unspecified: Secondary | ICD-10-CM | POA: Diagnosis not present

## 2020-07-07 DIAGNOSIS — Z955 Presence of coronary angioplasty implant and graft: Secondary | ICD-10-CM

## 2020-07-07 DIAGNOSIS — I10 Essential (primary) hypertension: Secondary | ICD-10-CM | POA: Diagnosis not present

## 2020-07-07 NOTE — Progress Notes (Signed)
Nutrition Note Spoke with patient today about heart healthy diet. Reviewed lipid panel and A1C. Pt states feeling overwhelmed with diabetes diagnosis and trying to make heart healthy changes as well. She has researched DASH and mediterranean. Recommended eating pattern of lean protein at every meal or snack, 2 servings fatty fish per week, 3-5 servings non starchy vegetables per day, 2-3 servings fruit per day, 1 oz nuts per day. Provided handouts and recipes.  Will continue to educate on label reading and carbohydrates impact on CBGs. Collaborated with pt for goal setting. Pt verbalized understanding.      Michaele Offer, MS, RDN, LDN

## 2020-07-09 ENCOUNTER — Encounter (HOSPITAL_COMMUNITY)
Admission: RE | Admit: 2020-07-09 | Discharge: 2020-07-09 | Disposition: A | Discharge status: Home / Self Care | Payer: PPO | Source: Ambulatory Visit | Attending: Cardiology | Admitting: Cardiology

## 2020-07-09 ENCOUNTER — Other Ambulatory Visit: Payer: Self-pay

## 2020-07-09 DIAGNOSIS — Z955 Presence of coronary angioplasty implant and graft: Secondary | ICD-10-CM

## 2020-07-11 ENCOUNTER — Encounter (HOSPITAL_COMMUNITY)
Admission: RE | Admit: 2020-07-11 | Discharge: 2020-07-11 | Disposition: A | Discharge status: Home / Self Care | Payer: PPO | Source: Ambulatory Visit | Attending: Cardiology | Admitting: Cardiology

## 2020-07-11 ENCOUNTER — Other Ambulatory Visit: Payer: Self-pay

## 2020-07-11 DIAGNOSIS — Z955 Presence of coronary angioplasty implant and graft: Secondary | ICD-10-CM | POA: Diagnosis not present

## 2020-07-11 DIAGNOSIS — I4892 Unspecified atrial flutter: Secondary | ICD-10-CM | POA: Diagnosis not present

## 2020-07-11 DIAGNOSIS — E1165 Type 2 diabetes mellitus with hyperglycemia: Secondary | ICD-10-CM | POA: Diagnosis not present

## 2020-07-11 DIAGNOSIS — E039 Hypothyroidism, unspecified: Secondary | ICD-10-CM | POA: Diagnosis not present

## 2020-07-11 DIAGNOSIS — I2510 Atherosclerotic heart disease of native coronary artery without angina pectoris: Secondary | ICD-10-CM | POA: Diagnosis not present

## 2020-07-11 DIAGNOSIS — I10 Essential (primary) hypertension: Secondary | ICD-10-CM | POA: Diagnosis not present

## 2020-07-11 DIAGNOSIS — E7800 Pure hypercholesterolemia, unspecified: Secondary | ICD-10-CM | POA: Diagnosis not present

## 2020-07-14 ENCOUNTER — Encounter (HOSPITAL_COMMUNITY)
Admission: RE | Admit: 2020-07-14 | Discharge: 2020-07-14 | Disposition: A | Discharge status: Home / Self Care | Payer: PPO | Source: Ambulatory Visit | Attending: Cardiology | Admitting: Cardiology

## 2020-07-14 ENCOUNTER — Other Ambulatory Visit: Payer: Self-pay

## 2020-07-14 VITALS — Wt 253.1 lb

## 2020-07-14 DIAGNOSIS — Z955 Presence of coronary angioplasty implant and graft: Secondary | ICD-10-CM | POA: Diagnosis not present

## 2020-07-14 NOTE — Progress Notes (Signed)
Nutrition Note - Follow Up  Spoke with pt. Reviewed most recent lipid and A1C values. Pt A1C increased from 6.5 to 7.4. All Lipids now WNL. Discussed lifestyle/diet changes to lower A1C.  Reiterated Plate method and how to portion carbohydrates.  Pt has been actively making changes to her diet to reduce carbohydrates. She states barrier of snacking and feeling bored/lonely at home. She does better when she gets to be with friends/family.  She has been doing fast food more often because she lives alone and does not want to cook. We discussed ways to make easy meals for one. Will continue to work on carb counting and lowering CBGs. She feels CBG monitoring will increase her anxiety at this point. Pt verbalizes understanding.   Michaele Offer, MS, RDN, LDN

## 2020-07-15 LAB — GLUCOSE, CAPILLARY: Glucose-Capillary: 109 mg/dL — ABNORMAL HIGH (ref 70–99)

## 2020-07-15 NOTE — Progress Notes (Signed)
Cardiac Individual Treatment Plan  Patient Details  Name: [name] MRN: [id number] Date of Birth: 1951-06-01 Referring Provider:     [facility] from 06/12/2020 in [facility]  Referring Provider Rex Kras DO      Initial Encounter Date:    CARDIAC REHAB PHASE II ORIENTATION from 06/12/2020 in [place]  Date 06/12/20      Visit Diagnosis: S/P DES x 2 LAD 03/18/20  Patient's Home Medications on Admission:  Current Outpatient Medications:  .  acetaminophen (TYLENOL) 500 MG tablet, Take 1,000 mg by mouth every 6 (six) hours as needed for mild pain or headache., Disp: , Rfl:  .  amLODipine (NORVASC) 10 MG tablet, Take 10 mg by mouth daily. , Disp: , Rfl:  .  apixaban (ELIQUIS) 5 MG TABS tablet, Take 5 mg by mouth 2 (two) times daily., Disp: , Rfl:  .  atorvastatin (LIPITOR) 80 MG tablet, Take 1 tablet (80 mg total) by mouth daily., Disp: 90 tablet, Rfl: 1 .  clopidogrel (PLAVIX) 75 MG tablet, Take 1 tablet (75 mg total) by mouth daily with breakfast., Disp: 90 tablet, Rfl: 1 .  escitalopram (LEXAPRO) 20 MG tablet, Take 20 mg by mouth daily., Disp: , Rfl:  .  estradiol (ESTRACE) 1 MG tablet, Take 1 mg by mouth daily. , Disp: , Rfl:  .  ezetimibe (ZETIA) 10 MG tablet, Take 10 mg by mouth daily., Disp: , Rfl:  .  hydrochlorothiazide (MICROZIDE) 12.5 MG capsule, Take 1 capsule by mouth once daily in the morning, Disp: 90 capsule, Rfl: 0 .  ibuprofen (ADVIL) 200 MG tablet, Take 200 mg by mouth every 6 (six) hours as needed., Disp: , Rfl:  .  irbesartan (AVAPRO) 300 MG tablet, Take 300 mg by mouth daily., Disp: , Rfl:  .  levothyroxine (SYNTHROID, LEVOTHROID) 100 MCG tablet, Take 100 mcg by mouth at bedtime. , Disp: , Rfl:  .  medroxyPROGESTERone (PROVERA) 5 MG tablet, Take 5 mg by mouth daily., Disp: , Rfl:  .  metoprolol succinate (TOPROL XL) 50 MG 24 hr tablet, Take 1 tablet (50 mg total) by mouth  in the morning. Hold if systolic blood pressure (top blood pressure number) less than 100 mmHg or heart rate less than 60 bpm (pulse)., Disp: 90 tablet, Rfl: 1 .  nitroGLYCERIN (NITROSTAT) 0.4 MG SL tablet, Place 1 tablet (0.4 mg total) under the tongue every 5 (five) minutes as needed for chest pain. If you require more than two tablets five minutes apart go to the nearest ER via EMS., Disp: 30 tablet, Rfl: 0 .  pantoprazole (PROTONIX) 40 MG tablet, Take 1 tablet (40 mg total) by mouth daily., Disp: 90 tablet, Rfl: 1  Past Medical History: Past Medical History:  Diagnosis Date  . Arthritis   . Coronary artery disease   . Depression   . Diabetes mellitus without complication ([place])   . GERD (gastroesophageal reflux disease)   . Hypercholesteremia   . Hypertension   . Hypothyroidism   . Pre-diabetes 08/19/2019  . Sleep apnea    mild-modreate-tried cpap    Tobacco Use: Social History   Tobacco Use  Smoking Status Never Smoker  Smokeless Tobacco Never Used    Labs: Recent Review Flowsheet Data    Labs for ITP Cardiac and Pulmonary Rehab Latest Ref Rng & Units 09/30/2011 06/25/2020   Cholestrol 100 - 199 mg/dL - 154   LDLCALC 0 - 99 mg/dL -  68   LDLDIRECT 0 - 99 mg/dL - 68   HDL >39 mg/dL - 55   Trlycerides 0 - 149 mg/dL - 188(H)   TCO2 0 - 100 mmol/L 24 -      Capillary Blood Glucose: Lab Results  Component Value Date   GLUCAP 109 (H) 07/14/2020   GLUCAP 142 (H) 06/16/2020   GLUCAP 148 (H) 03/18/2020     Exercise Target Goals: Exercise Program Goal: Individual exercise prescription set using results from initial 6 min walk test and THRR while considering  patient's activity barriers and safety.   Exercise Prescription Goal: Initial exercise prescription builds to 30-45 minutes a day of aerobic activity, 2-3 days per week.  Home exercise guidelines will be given to patient during program as part of exercise prescription that the participant will acknowledge.  Activity  Barriers & Risk Stratification:  Activity Barriers & Cardiac Risk Stratification - 06/12/20 1550      Activity Barriers & Cardiac Risk Stratification   Activity Barriers Back Problems;Joint Problems    Cardiac Risk Stratification Moderate           6 Minute Walk:  6 Minute Walk    Row Name 06/12/20 1549         6 Minute Walk   Phase Initial     Distance 1200 feet     Walk Time 6 minutes     # of Rest Breaks 0     MPH 2.3     METS 2.3     RPE 12     Perceived Dyspnea  1     VO2 Peak 7.9     Symptoms Yes (comment)     Comments mild +1 SOB, and light headed toward end of walk test due to mask     Resting HR 75 bpm     Resting BP 118/74     Resting Oxygen Saturation  97 %     Exercise Oxygen Saturation  during 6 min walk 98 %     Max Ex. HR 116 bpm     Max Ex. BP 120/60     2 Minute Post BP 116/62            Oxygen Initial Assessment:   Oxygen Re-Evaluation:   Oxygen Discharge (Final Oxygen Re-Evaluation):   Initial Exercise Prescription:  Initial Exercise Prescription - 06/12/20 1500      Date of Initial Exercise RX and Referring Provider   Date 06/12/20    Referring Provider Rex Kras DO    Expected Discharge Date 08/08/20      NuStep   Level 2    SPM 70    Minutes 30    METs 2.2      Prescription Details   Frequency (times per week) 3x    Duration Progress to 10 minutes continuous walking  at current work load and total walking time to 30-45 min      Intensity   THRR 40-80% of Max Heartrate 61-122    Ratings of Perceived Exertion 11-13    Perceived Dyspnea 0-4      Progression   Progression Continue progressive overload as per policy without signs/symptoms or physical distress.      Resistance Training   Training Prescription Yes    Weight 3lbs    Reps 10-15           Perform Capillary Blood Glucose checks as needed.  Exercise Prescription Changes:   Exercise Prescription Changes    Row  Name 06/16/20 1600 06/25/20 1430  07/04/20 1500 07/14/20 1505       Response to Exercise   Blood Pressure (Admit) 118/68 120/70 138/60 118/70    Blood Pressure (Exercise) 130/70 114/70 122/82 128/80    Blood Pressure (Exit) 120/74 108/70 114/56 114/56    Heart Rate (Admit) 81 bpm 84 bpm 79 bpm 62 bpm    Heart Rate (Exercise) 89 bpm 90 bpm 91 bpm 93 bpm    Heart Rate (Exit) 81 bpm 64 bpm 88 bpm 72 bpm    Rating of Perceived Exertion (Exercise) _0 Symptoms None None None None    Comments Pt's first day of exercise in the CRP2 program. Reviwed METs and home Exercise Rx Reviewed METs Pt demonstrates good attendance in the CRP2 program.    Duration Progress to 30 minutes of  aerobic without signs/symptoms of physical distress Progress to 30 minutes of  aerobic without signs/symptoms of physical distress Progress to 30 minutes of  aerobic without signs/symptoms of physical distress Continue with 30 min of aerobic exercise without signs/symptoms of physical distress.    Intensity THRR unchanged THRR unchanged THRR unchanged THRR unchanged      Progression   Progression Continue to progress workloads to maintain intensity without signs/symptoms of physical distress. Continue to progress workloads to maintain intensity without signs/symptoms of physical distress. Continue to progress workloads to maintain intensity without signs/symptoms of physical distress. Continue to progress workloads to maintain intensity without signs/symptoms of physical distress.    Average METs 1.9 1.9 2 1.8      Resistance Training   Training Prescription Yes No Yes Yes    Weight 3 lbs --  No weights done on Wednesday 3 lbs 3 lbs    Reps 10-15 -- 10-15 10-15    Time 10 Minutes -- 10 Minutes 10 Minutes      Interval Training   Interval Training No No No No      NuStep   Level _1 SPM 70 75 80 85    Minutes _2 METs 1.9 1.9 2 1.8      Home Exercise Plan   Plans to continue exercise at -- Home (comment) Home (comment)  Home (comment)    Frequency -- Add 2 additional days to program exercise sessions. Add 2 additional days to program exercise sessions. Add 2 additional days to program exercise sessions.    Initial Home Exercises Provided -- 06/25/20 06/25/20 06/25/20           Exercise Comments:   Exercise Comments    Row Name 06/16/20 1605 06/25/20 1440 07/14/20 1449       Exercise Comments Pt first day of exercise in the CRP2 program. Pt tolerated exercise well with no complaints. Reviewed METs and provided home exercise Rx to patient. Pt to walk 30 minutes 2x/week at home. Pt is making slow progess. She has been walking at home 2-3x/wk for 30 minutes. Continue to encourage patient to increase worklads in the CRP2 program and activity at home.            Exercise Goals and Review:   Exercise Goals    Row Name 06/12/20 1541             Exercise Goals   Increase Physical Activity Yes       Intervention Provide advice, education, support and counseling about physical activity/exercise needs.;Develop an individualized exercise prescription  for aerobic and resistive training based on initial evaluation findings, risk stratification, comorbidities and participant's personal goals.       Expected Outcomes Short Term: Attend rehab on a regular basis to increase amount of physical activity.;Long Term: Add in home exercise to make exercise part of routine and to increase amount of physical activity.;Long Term: Exercising regularly at least 3-5 days a week.       Increase Strength and Stamina Yes       Intervention Provide advice, education, support and counseling about physical activity/exercise needs.;Develop an individualized exercise prescription for aerobic and resistive training based on initial evaluation findings, risk stratification, comorbidities and participant's personal goals.       Expected Outcomes Short Term: Increase workloads from initial exercise prescription for resistance, speed, and  METs.;Long Term: Improve cardiorespiratory fitness, muscular endurance and strength as measured by increased METs and functional capacity (6MWT);Short Term: Perform resistance training exercises routinely during rehab and add in resistance training at home       Able to understand and use rate of perceived exertion (RPE) scale Yes       Intervention Provide education and explanation on how to use RPE scale       Expected Outcomes Short Term: Able to use RPE daily in rehab to express subjective intensity level;Long Term:  Able to use RPE to guide intensity level when exercising independently       Knowledge and understanding of Target Heart Rate Range (THRR) Yes       Intervention Provide education and explanation of THRR including how the numbers were predicted and where they are located for reference       Expected Outcomes Short Term: Able to state/look up THRR;Short Term: Able to use daily as guideline for intensity in rehab;Long Term: Able to use THRR to govern intensity when exercising independently       Able to check pulse independently Yes       Intervention Provide education and demonstration on how to check pulse in carotid and radial arteries.;Review the importance of being able to check your own pulse for safety during independent exercise       Expected Outcomes Short Term: Able to explain why pulse checking is important during independent exercise;Long Term: Able to check pulse independently and accurately       Understanding of Exercise Prescription Yes       Intervention Provide education, explanation, and written materials on patient's individual exercise prescription       Expected Outcomes Short Term: Able to explain program exercise prescription;Long Term: Able to explain home exercise prescription to exercise independently              Exercise Goals Re-Evaluation :  Exercise Goals Re-Evaluation    Row Name 06/16/20 1600 06/25/20 1435           Exercise Goal  Re-Evaluation   Exercise Goals Review Increase Physical Activity;Increase Strength and Stamina;Able to understand and use rate of perceived exertion (RPE) scale;Knowledge and understanding of Target Heart Rate Range (THRR);Able to check pulse independently;Understanding of Exercise Prescription Increase Physical Activity;Increase Strength and Stamina;Able to understand and use rate of perceived exertion (RPE) scale;Knowledge and understanding of Target Heart Rate Range (THRR);Able to check pulse independently;Understanding of Exercise Prescription      Comments Pt's first day in the CRP2 program. Pt tolerated exercise Rx well and understands exercise Rx, RPE scale, and THRR. Reviewed METs and Home Exercise Prescription. Pt encouraged to walk at home 2x/wk  for 30 minutes. Pt verbalized understanding of home exercise Rx and was provided copy.      Expected Outcomes Will continue to monitor patient and progress as tolerated. Pt will walk at home 2x/week for 30 minutes,             Discharge Exercise Prescription (Final Exercise Prescription Changes):  Exercise Prescription Changes - 07/14/20 1505      Response to Exercise   Blood Pressure (Admit) 118/70    Blood Pressure (Exercise) 128/80    Blood Pressure (Exit) 114/56    Heart Rate (Admit) 62 bpm    Heart Rate (Exercise) 93 bpm    Heart Rate (Exit) 72 bpm    Rating of Perceived Exertion (Exercise) 12    Symptoms None    Comments Pt demonstrates good attendance in the CRP2 program.    Duration Continue with 30 min of aerobic exercise without signs/symptoms of physical distress.    Intensity THRR unchanged      Progression   Progression Continue to progress workloads to maintain intensity without signs/symptoms of physical distress.    Average METs 1.8      Resistance Training   Training Prescription Yes    Weight 3 lbs    Reps 10-15    Time 10 Minutes      Interval Training   Interval Training No      NuStep   Level 3    SPM 85     Minutes 30    METs 1.8      Home Exercise Plan   Plans to continue exercise at Home (comment)    Frequency Add 2 additional days to program exercise sessions.    Initial Home Exercises Provided 06/25/20           Nutrition:  Target Goals: Understanding of nutrition guidelines, daily intake of sodium <1559m, cholesterol <2078m calories 30% from fat and 7% or less from saturated fats, daily to have 5 or more servings of fruits and vegetables.  Biometrics:  Pre Biometrics - 06/12/20 1550      Pre Biometrics   Height 5' 7.5" (1.715 m)    Weight 115.7 kg    Waist Circumference 49 inches    Hip Circumference 47 inches    Waist to Hip Ratio 1.04 %    BMI (Calculated) 39.34    Triceps Skinfold 35 mm    % Body Fat 50.6 %    Grip Strength 34 kg    Flexibility 0 in    Single Leg Stand 2.1 seconds            Nutrition Therapy Plan and Nutrition Goals:  Nutrition Therapy & Goals - 06/26/20 0721      Nutrition Therapy   Diet Heart Healthy      Personal Nutrition Goals   Nutrition Goal Pt to identify food quantities necessary to achieve weight loss of 6-24 lb at graduation from cardiac rehab.    Personal Goal #2 Pt to build a healthy plate including vegetables, fruits, whole grains, and low-fat dairy products in a heart healthy meal plan.    Personal Goal #3 Pt able to name foods that affect blood glucose      Intervention Plan   Intervention Prescribe, educate and counsel regarding individualized specific dietary modifications aiming towards targeted core components such as weight, hypertension, lipid management, diabetes, heart failure and other comorbidities.;Nutrition handout(s) given to patient.    Expected Outcomes Short Term Goal: A plan has been developed with personal  nutrition goals set during dietitian appointment.;Long Term Goal: Adherence to prescribed nutrition plan.           Nutrition Assessments:   Nutrition Goals Re-Evaluation:  Nutrition Goals  Re-Evaluation    Row Name 07/15/20 0902             Goals   Current Weight 253 lb 1.4 oz (114.8 kg)       Nutrition Goal Pt to identify food quantities necessary to achieve weight loss of 6-24 lb at graduation from cardiac rehab.       Comment Reviewed Plate method/heart healthy diet         Personal Goal #2 Re-Evaluation   Personal Goal #2 Pt to build a healthy plate including vegetables, fruits, whole grains, and low-fat dairy products in a heart healthy meal plan.         Personal Goal #3 Re-Evaluation   Personal Goal #3 Pt able to name foods that affect blood glucose              Nutrition Goals Re-Evaluation:  Nutrition Goals Re-Evaluation    [place] Name 07/15/20 0902             Goals   Current Weight 253 lb 1.4 oz (114.8 kg)       Nutrition Goal Pt to identify food quantities necessary to achieve weight loss of 6-24 lb at graduation from cardiac rehab.       Comment Reviewed Plate method/heart healthy diet         Personal Goal #2 Re-Evaluation   Personal Goal #2 Pt to build a healthy plate including vegetables, fruits, whole grains, and low-fat dairy products in a heart healthy meal plan.         Personal Goal #3 Re-Evaluation   Personal Goal #3 Pt able to name foods that affect blood glucose              Nutrition Goals Discharge (Final Nutrition Goals Re-Evaluation):  Nutrition Goals Re-Evaluation - 07/15/20 0902      Goals   Current Weight 253 lb 1.4 oz (114.8 kg)    Nutrition Goal Pt to identify food quantities necessary to achieve weight loss of 6-24 lb at graduation from cardiac rehab.    Comment Reviewed Plate method/heart healthy diet      Personal Goal #2 Re-Evaluation   Personal Goal #2 Pt to build a healthy plate including vegetables, fruits, whole grains, and low-fat dairy products in a heart healthy meal plan.      Personal Goal #3 Re-Evaluation   Personal Goal #3 Pt able to name foods that affect blood glucose            Psychosocial: Target Goals: Acknowledge presence or absence of significant depression and/or stress, maximize coping skills, provide positive support system. Participant is able to verbalize types and ability to use techniques and skills needed for reducing stress and depression.  Initial Review & Psychosocial Screening:  Initial Psych Review & Screening - 06/12/20 1540      Initial Review   Current issues with Current Anxiety/Panic   Soua says that she feels a little depressed at times but has the support of her sisters and [place]? Yes   Shanoah lives alone but has her sisters who live nearby for support     Barriers   Psychosocial barriers to participate in program The patient should benefit from training in stress management  and relaxation.      Screening Interventions   Interventions Encouraged to exercise    Expected Outcomes Long Term Goal: Stressors or current issues are controlled or eliminated.;Short Term goal: Identification and review with participant of any Quality of Life or Depression concerns found by scoring the questionnaire.           Quality of Life Scores:  Quality of Life - 06/12/20 1552      Quality of Life   Select Quality of Life      Quality of Life Scores   Health/Function Pre 16.03 %    Socioeconomic Pre 23.86 %    Psych/Spiritual Pre 22.14 %    Family Pre 25.5 %    GLOBAL Pre 20.14 %          Scores of 19 and below usually indicate a poorer quality of life in these areas.  A difference of  2-3 points is a clinically meaningful difference.  A difference of 2-3 points in the total score of the Quality of Life Index has been associated with significant improvement in overall quality of life, self-image, physical symptoms, and general health in studies assessing change in quality of life.  PHQ-9: Recent Review Flowsheet Data    Depression screen [facility] 2/9 06/12/2020   Decreased Interest 1    Down, Depressed, Hopeless 0   PHQ - 2 Score 1     Interpretation of Total Score  Total Score Depression Severity:  1-4 = Minimal depression, 5-9 = Mild depression, 10-14 = Moderate depression, 15-19 = Moderately severe depression, 20-27 = Severe depression   Psychosocial Evaluation and Intervention:  Psychosocial Evaluation - 06/16/20 1654      Psychosocial Evaluation & Interventions   Interventions Stress management education;Relaxation education;Encouraged to exercise with the program and follow exercise prescription    Comments Ms. Pillard has a history of anxiety and depression. Today she presents to cardiac rehab with a joyful spirit, positive attitude and outlook on her health. She is eager to participate in CR. She has a strong support system including her children. Depression symptoms are management at this time. Ms. Kreager denied barriers to participation in CR or self health management.    Expected Outcomes Ms. Crewe will continue to have a positive attitude and outlook. She will self reflect and report any return of depression or anxiety symptoms.    Continue Psychosocial Services  Follow up required by staff           Psychosocial Re-Evaluation:  Psychosocial Re-Evaluation    [place] Name 06/24/20 1425 07/15/20 1300           Psychosocial Re-Evaluation   Current issues with Current Anxiety/Panic Current Anxiety/Panic      Comments Ms. Aden has a history of anxiety and depression. She continues to presents to cardiac rehab with a joyful spirit, positive attitude and outlook on her health. She is eager to participate in CR. She has a strong support system including her children. Depression symptoms are management at this time. Ms. Avakian denied barriers to participation in CR or self health management. Ms. Mccannon has a history of anxiety and depression. She continues to presents to cardiac rehab with a joyful spirit, positive attitude and outlook on her health. She is eager to  participate in CR. She has a strong support system including her children. Depression symptoms are management at this time. Ms. Laird denied barriers to participation in CR or self health management.      Expected Outcomes  Ms. Demarco will continue to have a positive attitude and outlook. She will self reflect and report any return of depression or anxiety symptoms. Ms. Booze will continue to have a positive attitude and outlook. She will self reflect and report any return of depression or anxiety symptoms.      Interventions Encouraged to attend Cardiac Rehabilitation for the exercise Encouraged to attend Cardiac Rehabilitation for the exercise             Psychosocial Discharge (Final Psychosocial Re-Evaluation):  Psychosocial Re-Evaluation - 07/15/20 1300      Psychosocial Re-Evaluation   Current issues with Current Anxiety/Panic    Comments Ms. Thackeray has a history of anxiety and depression. She continues to presents to cardiac rehab with a joyful spirit, positive attitude and outlook on her health. She is eager to participate in CR. She has a strong support system including her children. Depression symptoms are management at this time. Ms. Jeon denied barriers to participation in CR or self health management.    Expected Outcomes Ms. Mun will continue to have a positive attitude and outlook. She will self reflect and report any return of depression or anxiety symptoms.    Interventions Encouraged to attend Cardiac Rehabilitation for the exercise           Vocational Rehabilitation: Provide vocational rehab assistance to qualifying candidates.   Vocational Rehab Evaluation & Intervention:  Vocational Rehab - 06/12/20 1544      Initial Vocational Rehab Evaluation & Intervention   Assessment shows need for Vocational Rehabilitation No   Solita works 3 days a week and does not need vocational rehab at this time          Education: Education Goals: Education classes will be provided  on a weekly basis, covering required topics. Participant will state understanding/return demonstration of topics presented.  Learning Barriers/Preferences:  Learning Barriers/Preferences - 06/12/20 1553      Learning Barriers/Preferences   Learning Barriers None    Learning Preferences Written Material;Skilled Demonstration;Individual Instruction           Education Topics: Count Your Pulse:  -Group instruction provided by verbal instruction, demonstration, patient participation and written materials to support subject.  Instructors address importance of being able to find your pulse and how to count your pulse when at home without a heart monitor.  Patients get hands on experience counting their pulse with staff help and individually.   Heart Attack, Angina, and Risk Factor Modification:  -Group instruction provided by verbal instruction, video, and written materials to support subject.  Instructors address signs and symptoms of angina and heart attacks.    Also discuss risk factors for heart disease and how to make changes to improve heart health risk factors.   Functional Fitness:  -Group instruction provided by verbal instruction, demonstration, patient participation, and written materials to support subject.  Instructors address safety measures for doing things around the house.  Discuss how to get up and down off the floor, how to pick things up properly, how to safely get out of a chair without assistance, and balance training.   Meditation and Mindfulness:  -Group instruction provided by verbal instruction, patient participation, and written materials to support subject.  Instructor addresses importance of mindfulness and meditation practice to help reduce stress and improve awareness.  Instructor also leads participants through a meditation exercise.    Stretching for Flexibility and Mobility:  -Group instruction provided by verbal instruction, patient participation, and  written materials to support subject.  Instructors lead participants through series of stretches that are designed to increase flexibility thus improving mobility.  These stretches are additional exercise for major muscle groups that are typically performed during regular warm up and cool down.   Hands Only CPR:  -Group verbal, video, and participation provides a basic overview of AHA guidelines for community CPR. Role-play of emergencies allow participants the opportunity to practice calling for help and chest compression technique with discussion of AED use.   Hypertension: -Group verbal and written instruction that provides a basic overview of hypertension including the most recent diagnostic guidelines, risk factor reduction with self-care instructions and medication management.    Nutrition I class: Heart Healthy Eating:  -Group instruction provided by PowerPoint slides, verbal discussion, and written materials to support subject matter. The instructor gives an explanation and review of the Therapeutic Lifestyle Changes diet recommendations, which includes a discussion on lipid goals, dietary fat, sodium, fiber, plant stanol/sterol esters, sugar, and the components of a well-balanced, healthy diet.   Nutrition II class: Lifestyle Skills:  -Group instruction provided by PowerPoint slides, verbal discussion, and written materials to support subject matter. The instructor gives an explanation and review of label reading, grocery shopping for heart health, heart healthy recipe modifications, and ways to make healthier choices when eating out.   Diabetes Question & Answer:  -Group instruction provided by PowerPoint slides, verbal discussion, and written materials to support subject matter. The instructor gives an explanation and review of diabetes co-morbidities, pre- and post-prandial blood glucose goals, pre-exercise blood glucose goals, signs, symptoms, and treatment of hypoglycemia and  hyperglycemia, and foot care basics.   Diabetes Blitz:  -Group instruction provided by PowerPoint slides, verbal discussion, and written materials to support subject matter. The instructor gives an explanation and review of the physiology behind type 1 and type 2 diabetes, diabetes medications and rational behind using different medications, pre- and post-prandial blood glucose recommendations and Hemoglobin A1c goals, diabetes diet, and exercise including blood glucose guidelines for exercising safely.    Portion Distortion:  -Group instruction provided by PowerPoint slides, verbal discussion, written materials, and food models to support subject matter. The instructor gives an explanation of serving size versus portion size, changes in portions sizes over the last 20 years, and what consists of a serving from each food group.   Stress Management:  -Group instruction provided by verbal instruction, video, and written materials to support subject matter.  Instructors review role of stress in heart disease and how to cope with stress positively.     Exercising on Your Own:  -Group instruction provided by verbal instruction, power point, and written materials to support subject.  Instructors discuss benefits of exercise, components of exercise, frequency and intensity of exercise, and end points for exercise.  Also discuss use of nitroglycerin and activating EMS.  Review options of places to exercise outside of rehab.  Review guidelines for sex with heart disease.   Cardiac Drugs I:  -Group instruction provided by verbal instruction and written materials to support subject.  Instructor reviews cardiac drug classes: antiplatelets, anticoagulants, beta blockers, and statins.  Instructor discusses reasons, side effects, and lifestyle considerations for each drug class.   Cardiac Drugs II:  -Group instruction provided by verbal instruction and written materials to support subject.  Instructor  reviews cardiac drug classes: angiotensin converting enzyme inhibitors (ACE-I), angiotensin II receptor blockers (ARBs), nitrates, and calcium channel blockers.  Instructor discusses reasons, side effects, and lifestyle considerations for each drug class.   Anatomy and  Physiology of the Circulatory System:  Group verbal and written instruction and models provide basic cardiac anatomy and physiology, with the coronary electrical and arterial systems. Review of: AMI, Angina, Valve disease, Heart Failure, Peripheral Artery Disease, Cardiac Arrhythmia, Pacemakers, and the ICD.   Other Education:  -Group or individual verbal, written, or video instructions that support the educational goals of the cardiac rehab program.   Holiday Eating Survival Tips:  -Group instruction provided by PowerPoint slides, verbal discussion, and written materials to support subject matter. The instructor gives patients tips, tricks, and techniques to help them not only survive but enjoy the holidays despite the onslaught of food that accompanies the holidays.   Knowledge Questionnaire Score:  Knowledge Questionnaire Score - 06/12/20 1551      Knowledge Questionnaire Score   Pre Score 19/24           Core Components/Risk Factors/Patient Goals at Admission:  Personal Goals and Risk Factors at Admission - 06/12/20 1611      Core Components/Risk Factors/Patient Goals on Admission    Weight Management Obesity;Yes    Intervention Weight Management: Provide education and appropriate resources to help participant work on and attain dietary goals.;Weight Management: Develop a combined nutrition and exercise program designed to reach desired caloric intake, while maintaining appropriate intake of nutrient and fiber, sodium and fats, and appropriate energy expenditure required for the weight goal.;Weight Management/Obesity: Establish reasonable short term and long term weight goals.;Obesity: Provide education and  appropriate resources to help participant work on and attain dietary goals.    Admit Weight 255 lb 1.2 oz (115.7 kg)    Goal Weight: Long Term 220 lb (99.8 kg)    Expected Outcomes Short Term: Continue to assess and modify interventions until short term weight is achieved;Long Term: Adherence to nutrition and physical activity/exercise program aimed toward attainment of established weight goal;Understanding recommendations for meals to include 15-35% energy as protein, 25-35% energy from fat, 35-60% energy from carbohydrates, less than 222m of dietary cholesterol, 20-35 gm of total fiber daily;Weight Loss: Understanding of general recommendations for a balanced deficit meal plan, which promotes 1-2 lb weight loss per week and includes a negative energy balance of [phone] kcal/d;Understanding of distribution of calorie intake throughout the day with the consumption of 4-5 meals/snacks    Diabetes Yes    Intervention Provide education about signs/symptoms and action to take for hypo/hyperglycemia.;Provide education about proper nutrition, including hydration, and aerobic/resistive exercise prescription along with prescribed medications to achieve blood glucose in normal ranges: Fasting glucose 65-99 mg/dL    Expected Outcomes Short Term: Participant verbalizes understanding of the signs/symptoms and immediate care of hyper/hypoglycemia, proper foot care and importance of medication, aerobic/resistive exercise and nutrition plan for blood glucose control.;Long Term: Attainment of HbA1C < 7%.    Hypertension Yes    Intervention Provide education on lifestyle modifcations including regular physical activity/exercise, weight management, moderate sodium restriction and increased consumption of fresh fruit, vegetables, and low fat dairy, alcohol moderation, and smoking cessation.;Monitor prescription use compliance.    Expected Outcomes Short Term: Continued assessment and intervention until BP is < 140/986mHG  in hypertensive participants. < 130/8049mG in hypertensive participants with diabetes, heart failure or chronic kidney disease.;Long Term: Maintenance of blood pressure at goal levels.    Lipids Yes    Intervention Provide education and support for participant on nutrition & aerobic/resistive exercise along with prescribed medications to achieve LDL <30m32mDL >40mg31m Expected Outcomes Short Term: Participant states understanding of  desired cholesterol values and is compliant with medications prescribed. Participant is following exercise prescription and nutrition guidelines.;Long Term: Cholesterol controlled with medications as prescribed, with individualized exercise RX and with personalized nutrition plan. Value goals: LDL < 75m, HDL > 40 mg.    Stress Yes    Intervention Offer individual and/or small group education and counseling on adjustment to heart disease, stress management and health-related lifestyle change. Teach and support self-help strategies.;Refer participants experiencing significant psychosocial distress to appropriate mental health specialists for further evaluation and treatment. When possible, include family members and significant others in education/counseling sessions.    Expected Outcomes Short Term: Participant demonstrates changes in health-related behavior, relaxation and other stress management skills, ability to obtain effective social support, and compliance with psychotropic medications if prescribed.;Long Term: Emotional wellbeing is indicated by absence of clinically significant psychosocial distress or social isolation.           Core Components/Risk Factors/Patient Goals Review:   Goals and Risk Factor Review    Row Name 06/16/20 1658 06/24/20 1426 07/15/20 1300         Core Components/Risk Factors/Patient Goals Review   Personal Goals Review Weight Management/Obesity;Lipids;Diabetes;Stress;Hypertension Weight  Management/Obesity;Lipids;Diabetes;Stress;Hypertension Weight Management/Obesity;Lipids;Diabetes;Stress;Hypertension     Review Ms MMurdochhas multiple CAD risk factors. She is eager to participate in cardiac rehab for modifications and exercise routien. Her personal goals are to increase her stamina and strength, decrease her anxiety, and improve her diet. She also hopes to improve her coping skills. Ms MDworkinhas multiple CAD risk factors. She is eager to participate in cardiac rehab for modifications and exercise routien. Her personal goals are to increase her stamina and strength, decrease her anxiety, and improve her diet. She also hopes to improve her coping skills. Ms MDoanehas multiple CAD risk factors. She is eager to continue participating in cardiac rehab for modifications and exercise routein. Her personal goals are to increase her stamina and strength, decrease her anxiety, and improve her diet. She also hopes to improve her coping skills.     Expected Outcomes Patient will continue to participate in CR for risk factor modifications and exercise. Patient will continue to participate in CR for risk factor modifications and exercise. Patient will continue to participate in CR for risk factor modifications and exercise.            Core Components/Risk Factors/Patient Goals at Discharge (Final Review):   Goals and Risk Factor Review - 07/15/20 1300      Core Components/Risk Factors/Patient Goals Review   Personal Goals Review Weight Management/Obesity;Lipids;Diabetes;Stress;Hypertension    Review Ms MPolinskyhas multiple CAD risk factors. She is eager to continue participating in cardiac rehab for modifications and exercise routein. Her personal goals are to increase her stamina and strength, decrease her anxiety, and improve her diet. She also hopes to improve her coping skills.    Expected Outcomes Patient will continue to participate in CR for risk factor modifications and exercise.            ITP Comments:  ITP Comments    Row Name 06/12/20 1541 06/12/20 1549 06/16/20 1651 06/24/20 1421 07/15/20 1256   ITP Comments Dr. TFransico Him Medical Director Dr. TFransico HimMD, Medical Director PNevin BloodgoodP. MGuralcompleted her first cardiac rehab exercise session today and tolerated well. VSS. Patient denied complaints. Worked to an RPE of 11-12. Ms. MWatkin goals are to increase her stamina and strength, decrease anxiety, and improve her diet. Long term she hopes to increase her coping  skills. 30 day ITP review: Ms Crady has completed 3 cardiac rehab exercise sessions since admission. VSS. Denies complaints. Workes to an RPE of 11-13. Goals are to increase stamina and strength, decrease her anxiety about her health, and to improve her diet. It is too early in the program to evaluate progression towards goals. Will reassess in 30 days. 30 day ITP review: Ms Wellborn has completed 11 cardiac rehab exercise sessions since admission. VSS. Denies complaints. Workes to an RPE of 11-13. Goals are to increase stamina and strength, decrease her anxiety about her health, and to improve her diet. She has met with RD to discuss health dietary changes. Recently had a PCP appointment and was told she needed to start metformin for DM and her cholesterol levels needed better managing. This was a disappointment to her however she is eager to modify her CAD risk factors and eager for education. She is becoming more confident about her ability to exercise and manage her health. She continues to tolerate workload increases.          Comments: see ITP comments

## 2020-07-16 ENCOUNTER — Encounter (HOSPITAL_COMMUNITY)
Admission: RE | Admit: 2020-07-16 | Discharge: 2020-07-16 | Disposition: A | Discharge status: Home / Self Care | Payer: PPO | Source: Ambulatory Visit | Attending: Cardiology | Admitting: Cardiology

## 2020-07-16 ENCOUNTER — Other Ambulatory Visit: Payer: Self-pay

## 2020-07-16 DIAGNOSIS — Z955 Presence of coronary angioplasty implant and graft: Secondary | ICD-10-CM | POA: Diagnosis not present

## 2020-07-18 ENCOUNTER — Other Ambulatory Visit: Payer: Self-pay

## 2020-07-18 ENCOUNTER — Encounter (HOSPITAL_COMMUNITY)
Admission: RE | Admit: 2020-07-18 | Discharge: 2020-07-18 | Disposition: A | Discharge status: Home / Self Care | Payer: PPO | Source: Ambulatory Visit | Attending: Cardiology | Admitting: Cardiology

## 2020-07-18 DIAGNOSIS — I2510 Atherosclerotic heart disease of native coronary artery without angina pectoris: Secondary | ICD-10-CM | POA: Diagnosis not present

## 2020-07-18 DIAGNOSIS — Z955 Presence of coronary angioplasty implant and graft: Secondary | ICD-10-CM | POA: Insufficient documentation

## 2020-07-21 ENCOUNTER — Encounter (HOSPITAL_COMMUNITY)
Admission: RE | Admit: 2020-07-21 | Discharge: 2020-07-21 | Disposition: A | Discharge status: Home / Self Care | Payer: PPO | Source: Ambulatory Visit | Attending: Cardiology | Admitting: Cardiology

## 2020-07-21 ENCOUNTER — Other Ambulatory Visit: Payer: Self-pay

## 2020-07-21 DIAGNOSIS — Z955 Presence of coronary angioplasty implant and graft: Secondary | ICD-10-CM

## 2020-07-21 DIAGNOSIS — I2510 Atherosclerotic heart disease of native coronary artery without angina pectoris: Secondary | ICD-10-CM | POA: Diagnosis not present

## 2020-07-21 LAB — GLUCOSE, CAPILLARY: Glucose-Capillary: 166 mg/dL — ABNORMAL HIGH (ref 70–99)

## 2020-07-23 ENCOUNTER — Encounter (HOSPITAL_COMMUNITY)
Admission: RE | Admit: 2020-07-23 | Discharge: 2020-07-23 | Disposition: A | Discharge status: Home / Self Care | Payer: PPO | Source: Ambulatory Visit | Attending: Cardiology | Admitting: Cardiology

## 2020-07-23 ENCOUNTER — Other Ambulatory Visit: Payer: Self-pay

## 2020-07-23 DIAGNOSIS — Z955 Presence of coronary angioplasty implant and graft: Secondary | ICD-10-CM

## 2020-07-23 DIAGNOSIS — I2510 Atherosclerotic heart disease of native coronary artery without angina pectoris: Secondary | ICD-10-CM | POA: Diagnosis not present

## 2020-07-25 ENCOUNTER — Encounter (HOSPITAL_COMMUNITY)
Admission: RE | Admit: 2020-07-25 | Discharge: 2020-07-25 | Disposition: A | Discharge status: Home / Self Care | Payer: PPO | Source: Ambulatory Visit | Attending: Cardiology | Admitting: Cardiology

## 2020-07-25 ENCOUNTER — Other Ambulatory Visit: Payer: Self-pay

## 2020-07-25 DIAGNOSIS — Z955 Presence of coronary angioplasty implant and graft: Secondary | ICD-10-CM

## 2020-07-25 DIAGNOSIS — I2510 Atherosclerotic heart disease of native coronary artery without angina pectoris: Secondary | ICD-10-CM | POA: Diagnosis not present

## 2020-07-28 ENCOUNTER — Encounter (HOSPITAL_COMMUNITY)
Admission: RE | Admit: 2020-07-28 | Discharge: 2020-07-28 | Disposition: A | Discharge status: Home / Self Care | Payer: PPO | Source: Ambulatory Visit | Attending: Cardiology | Admitting: Cardiology

## 2020-07-28 ENCOUNTER — Other Ambulatory Visit: Payer: Self-pay

## 2020-07-28 DIAGNOSIS — I2510 Atherosclerotic heart disease of native coronary artery without angina pectoris: Secondary | ICD-10-CM | POA: Diagnosis not present

## 2020-07-28 DIAGNOSIS — Z955 Presence of coronary angioplasty implant and graft: Secondary | ICD-10-CM

## 2020-07-28 NOTE — Progress Notes (Signed)
Nutrition Note - Follow Up  Spoke with pt. Addressed frustrations with lack of weight loss. Reviewed current diet intake and physical activity/exercise.  Pt exercising 3-4 days per week. Somewhat sedentary outside of exercise. She has reduced calorie intake by making heart healthy changes to her diet.  Reading labels increases stress.  She is trying to follow plate method.  Recommended adding another day of exercise and increased physical activity. Reviewed ways to be more physically active.   Pt verbalized understanding.  Will continue monitoring pt during her time in cardiac rehab.  Michaele Offer, MS, RDN, LDN

## 2020-07-30 ENCOUNTER — Other Ambulatory Visit: Payer: Self-pay

## 2020-07-30 ENCOUNTER — Encounter (HOSPITAL_COMMUNITY)
Admission: RE | Admit: 2020-07-30 | Discharge: 2020-07-30 | Disposition: A | Discharge status: Home / Self Care | Payer: PPO | Source: Ambulatory Visit | Attending: Cardiology | Admitting: Cardiology

## 2020-07-30 DIAGNOSIS — Z955 Presence of coronary angioplasty implant and graft: Secondary | ICD-10-CM

## 2020-07-30 DIAGNOSIS — I2510 Atherosclerotic heart disease of native coronary artery without angina pectoris: Secondary | ICD-10-CM | POA: Diagnosis not present

## 2020-08-01 ENCOUNTER — Other Ambulatory Visit: Payer: Self-pay

## 2020-08-01 ENCOUNTER — Encounter (HOSPITAL_COMMUNITY)
Admission: RE | Admit: 2020-08-01 | Discharge: 2020-08-01 | Disposition: A | Discharge status: Home / Self Care | Payer: PPO | Source: Ambulatory Visit | Attending: Cardiology | Admitting: Cardiology

## 2020-08-01 DIAGNOSIS — Z955 Presence of coronary angioplasty implant and graft: Secondary | ICD-10-CM

## 2020-08-01 DIAGNOSIS — I2510 Atherosclerotic heart disease of native coronary artery without angina pectoris: Secondary | ICD-10-CM | POA: Diagnosis not present

## 2020-08-04 ENCOUNTER — Encounter (HOSPITAL_COMMUNITY)
Admission: RE | Admit: 2020-08-04 | Discharge: 2020-08-04 | Disposition: A | Discharge status: Home / Self Care | Payer: PPO | Source: Ambulatory Visit | Attending: Cardiology | Admitting: Cardiology

## 2020-08-04 ENCOUNTER — Other Ambulatory Visit: Payer: Self-pay

## 2020-08-04 DIAGNOSIS — I2510 Atherosclerotic heart disease of native coronary artery without angina pectoris: Secondary | ICD-10-CM | POA: Diagnosis not present

## 2020-08-04 DIAGNOSIS — Z955 Presence of coronary angioplasty implant and graft: Secondary | ICD-10-CM

## 2020-08-06 ENCOUNTER — Encounter (HOSPITAL_COMMUNITY)
Admission: RE | Admit: 2020-08-06 | Discharge: 2020-08-06 | Disposition: A | Discharge status: Home / Self Care | Payer: PPO | Source: Ambulatory Visit | Attending: Cardiology | Admitting: Cardiology

## 2020-08-06 ENCOUNTER — Other Ambulatory Visit: Payer: Self-pay

## 2020-08-06 VITALS — Ht 67.5 in | Wt 251.3 lb

## 2020-08-06 DIAGNOSIS — I2510 Atherosclerotic heart disease of native coronary artery without angina pectoris: Secondary | ICD-10-CM | POA: Diagnosis not present

## 2020-08-06 DIAGNOSIS — Z955 Presence of coronary angioplasty implant and graft: Secondary | ICD-10-CM

## 2020-08-07 NOTE — Progress Notes (Signed)
Nutrition Note  Spoke with pt today about carbohydrates and diabetes. Reviewed the benefits of carbohydrate counting and eating a consistent amount of carbohydrates across the day. Showed pt how to calculate carbohydrate servings, and distributed handouts for patient to practice. Recommended pt eat 3 servings of carbohydrates at meals. Discussed the importance of creating a balanced meal with the addition of protein and non-starchy vegetables. Distributed recipes and snack ideas to patient to try. Pt verbalized understanding of material discussed today. Distributed RD contact information.     Michaele Offer, MS, RDN, LDN

## 2020-08-08 ENCOUNTER — Other Ambulatory Visit: Payer: Self-pay

## 2020-08-08 ENCOUNTER — Encounter (HOSPITAL_COMMUNITY)
Admission: RE | Admit: 2020-08-08 | Discharge: 2020-08-08 | Disposition: A | Discharge status: Home / Self Care | Payer: PPO | Source: Ambulatory Visit | Attending: Cardiology | Admitting: Cardiology

## 2020-08-08 DIAGNOSIS — I2510 Atherosclerotic heart disease of native coronary artery without angina pectoris: Secondary | ICD-10-CM | POA: Diagnosis not present

## 2020-08-08 DIAGNOSIS — Z955 Presence of coronary angioplasty implant and graft: Secondary | ICD-10-CM

## 2020-08-22 NOTE — Progress Notes (Signed)
Discharge Progress Report  Patient Details  Name: [name] MRN: [id number] Date of Birth: 08/27/1951 Referring Provider:     CARDIAC REHAB PHASE II ORIENTATION from 06/12/2020 in [facility]  Referring Provider Tolia, Sunit DO       Number of Visits: 22 of 24  Reason for Discharge:  Patient reached a stable level of exercise. Patient independent in their exercise. Patient has met program and personal goals.  Smoking History:  Social History   Tobacco Use  Smoking Status Never Smoker  Smokeless Tobacco Never Used    Diagnosis:  S/P DES x 2 LAD 03/18/20  ADL UCSD:   Initial Exercise Prescription:  Initial Exercise Prescription - 06/12/20 1500      Date of Initial Exercise RX and Referring Provider   Date 06/12/20    Referring Provider Tolia, Sunit DO    Expected Discharge Date 08/08/20      NuStep   Level 2    SPM 70    Minutes 30    METs 2.2      Prescription Details   Frequency (times per week) 3x    Duration Progress to 10 minutes continuous walking  at current work load and total walking time to 30-45 min      Intensity   THRR 40-80% of Max Heartrate 61-122    Ratings of Perceived Exertion 11-13    Perceived Dyspnea 0-4      Progression   Progression Continue progressive overload as per policy without signs/symptoms or physical distress.      Resistance Training   Training Prescription Yes    Weight 3lbs    Reps 10-15           Discharge Exercise Prescription (Final Exercise Prescription Changes):  Exercise Prescription Changes - 08/08/20 1450      Response to Exercise   Blood Pressure (Admit) 148/80    Blood Pressure (Exercise) 142/60    Blood Pressure (Exit) 120/62    Heart Rate (Admit) 100 bpm    Heart Rate (Exercise) 106 bpm    Heart Rate (Exit) 89 bpm    Rating of Perceived Exertion (Exercise) 12    Symptoms None    Comments Pt graduated from the CRP2 program.    Duration Continue with 30 min of  aerobic exercise without signs/symptoms of physical distress.    Intensity THRR unchanged      Progression   Progression Continue to progress workloads to maintain intensity without signs/symptoms of physical distress.    Average METs 2.2      Resistance Training   Training Prescription Yes    Weight 3 lbs    Reps 10-15    Time 10 Minutes      Interval Training   Interval Training No      NuStep   Level 4    SPM 85    Minutes 30    METs 2.2      Home Exercise Plan   [facility]    Frequency Add 3 additional days to program exercise sessions.    Initial Home Exercises Provided 06/25/20           Functional Capacity:  6 Minute Walk    Row Name 06/12/20 1549 08/06/20 1355       6 Minute Walk   Phase Initial Discharge    Distance 1200 feet 1352 feet    Distance % Change -- 12.7 %      Distance Feet Change -- 152 ft    Walk Time 6 minutes 6 minutes    # of Rest Breaks 0 0    MPH 2.3 2.56    METS 2.3 2.84    RPE 12 13    Perceived Dyspnea  1 0    VO2 Peak 7.9 9.93    Symptoms Yes (comment) No    Comments mild +1 SOB, and light headed toward end of walk test due to mask --    Resting HR 75 bpm 90 bpm    Resting BP 118/74 138/62    Resting Oxygen Saturation  97 % 96 %    Exercise Oxygen Saturation  during 6 min walk 98 % 98 %    Max Ex. HR 116 bpm 116 bpm    Max Ex. BP 120/60 152/60    2 Minute Post BP 116/62 104/70           Psychological, QOL, Others - Outcomes: PHQ 2/9: Depression screen PHQ 2/9 06/12/2020  Decreased Interest 1  Down, Depressed, Hopeless 0  PHQ - 2 Score 1    Quality of Life:  Quality of Life - 08/04/20 1445      Quality of Life Scores   Health/Function Post 25.54 %    Socioeconomic Post 27.5 %    Psych/Spiritual Post 24 %    Family Post 30 %    GLOBAL Post 26.21 %           Personal Goals: Goals established at orientation with interventions provided to work toward goal.   Personal Goals and Risk Factors at Admission - 06/12/20 1611      Core Components/Risk Factors/Patient Goals on Admission    Weight Management Obesity;Yes    Intervention Weight Management: Provide education and appropriate resources to help participant work on and attain dietary goals.;Weight Management: Develop a combined nutrition and exercise program designed to reach desired caloric intake, while maintaining appropriate intake of nutrient and fiber, sodium and fats, and appropriate energy expenditure required for the weight goal.;Weight Management/Obesity: Establish reasonable short term and long term weight goals.;Obesity: Provide education and appropriate resources to help participant work on and attain dietary goals.    Admit Weight 255 lb 1.2 oz (115.7 kg)    Goal Weight: Long Term 220 lb (99.8 kg)    Expected Outcomes Short Term: Continue to assess and modify interventions until short term weight is achieved;Long Term: Adherence to nutrition and physical activity/exercise program aimed toward attainment of established weight goal;Understanding recommendations for meals to include 15-35% energy as protein, 25-35% energy from fat, 35-60% energy from carbohydrates, less than 200mg of dietary cholesterol, 20-35 gm of total fiber daily;Weight Loss: Understanding of general recommendations for a balanced deficit meal plan, which promotes 1-2 lb weight loss per week and includes a negative energy balance of 500-1000 kcal/d;Understanding of distribution of calorie intake throughout the day with the consumption of 4-5 meals/snacks    Diabetes Yes    Intervention Provide education about signs/symptoms and action to take for hypo/hyperglycemia.;Provide education about proper nutrition, including hydration, and aerobic/resistive exercise prescription along with prescribed medications to achieve blood glucose in normal ranges: Fasting glucose 65-99 mg/dL    Expected Outcomes Short Term: Participant verbalizes  understanding of the signs/symptoms and immediate care of hyper/hypoglycemia, proper foot care and importance of medication, aerobic/resistive exercise and nutrition plan for blood glucose control.;Long Term: Attainment of HbA1C < 7%.    Hypertension Yes    Intervention Provide education on lifestyle   modifcations including regular physical activity/exercise, weight management, moderate sodium restriction and increased consumption of fresh fruit, vegetables, and low fat dairy, alcohol moderation, and smoking cessation.;Monitor prescription use compliance.    Expected Outcomes Short Term: Continued assessment and intervention until BP is < 140/90mm HG in hypertensive participants. < 130/80mm HG in hypertensive participants with diabetes, heart failure or chronic kidney disease.;Long Term: Maintenance of blood pressure at goal levels.    Lipids Yes    Intervention Provide education and support for participant on nutrition & aerobic/resistive exercise along with prescribed medications to achieve LDL <70mg, HDL >40mg.    Expected Outcomes Short Term: Participant states understanding of desired cholesterol values and is compliant with medications prescribed. Participant is following exercise prescription and nutrition guidelines.;Long Term: Cholesterol controlled with medications as prescribed, with individualized exercise RX and with personalized nutrition plan. Value goals: LDL < 70mg, HDL > 40 mg.    Stress Yes    Intervention Offer individual and/or small group education and counseling on adjustment to heart disease, stress management and health-related lifestyle change. Teach and support self-help strategies.;Refer participants experiencing significant psychosocial distress to appropriate mental health specialists for further evaluation and treatment. When possible, include family members and significant others in education/counseling sessions.    Expected Outcomes Short Term: Participant demonstrates  changes in health-related behavior, relaxation and other stress management skills, ability to obtain effective social support, and compliance with psychotropic medications if prescribed.;Long Term: Emotional wellbeing is indicated by absence of clinically significant psychosocial distress or social isolation.            Personal Goals Discharge:  Goals and Risk Factor Review    Row Name 06/16/20 1658 06/24/20 1426 07/15/20 1300         Core Components/Risk Factors/Patient Goals Review   Personal Goals Review Weight Management/Obesity;Lipids;Diabetes;Stress;Hypertension Weight Management/Obesity;Lipids;Diabetes;Stress;Hypertension Weight Management/Obesity;Lipids;Diabetes;Stress;Hypertension     Review Ms Elling has multiple CAD risk factors. She is eager to participate in cardiac rehab for modifications and exercise routien. Her personal goals are to increase her stamina and strength, decrease her anxiety, and improve her diet. She also hopes to improve her coping skills. Ms Lagrange has multiple CAD risk factors. She is eager to participate in cardiac rehab for modifications and exercise routien. Her personal goals are to increase her stamina and strength, decrease her anxiety, and improve her diet. She also hopes to improve her coping skills. Ms Aydin has multiple CAD risk factors. She is eager to continue participating in cardiac rehab for modifications and exercise routein. Her personal goals are to increase her stamina and strength, decrease her anxiety, and improve her diet. She also hopes to improve her coping skills.     Expected Outcomes Patient will continue to participate in CR for risk factor modifications and exercise. Patient will continue to participate in CR for risk factor modifications and exercise. Patient will continue to participate in CR for risk factor modifications and exercise.            Exercise Goals and Review:  Exercise Goals    Row Name 06/12/20 1541               Exercise Goals   Increase Physical Activity Yes       Intervention Provide advice, education, support and counseling about physical activity/exercise needs.;Develop an individualized exercise prescription for aerobic and resistive training based on initial evaluation findings, risk stratification, comorbidities and participant's personal goals.       Expected Outcomes Short Term: Attend rehab on   a regular basis to increase amount of physical activity.;Long Term: Add in home exercise to make exercise part of routine and to increase amount of physical activity.;Long Term: Exercising regularly at least 3-5 days a week.       Increase Strength and Stamina Yes       Intervention Provide advice, education, support and counseling about physical activity/exercise needs.;Develop an individualized exercise prescription for aerobic and resistive training based on initial evaluation findings, risk stratification, comorbidities and participant's personal goals.       Expected Outcomes Short Term: Increase workloads from initial exercise prescription for resistance, speed, and METs.;Long Term: Improve cardiorespiratory fitness, muscular endurance and strength as measured by increased METs and functional capacity (6MWT);Short Term: Perform resistance training exercises routinely during rehab and add in resistance training at home       Able to understand and use rate of perceived exertion (RPE) scale Yes       Intervention Provide education and explanation on how to use RPE scale       Expected Outcomes Short Term: Able to use RPE daily in rehab to express subjective intensity level;Long Term:  Able to use RPE to guide intensity level when exercising independently       Knowledge and understanding of Target Heart Rate Range (THRR) Yes       Intervention Provide education and explanation of THRR including how the numbers were predicted and where they are located for reference       Expected Outcomes Short Term: Able  to state/look up THRR;Short Term: Able to use daily as guideline for intensity in rehab;Long Term: Able to use THRR to govern intensity when exercising independently       Able to check pulse independently Yes       Intervention Provide education and demonstration on how to check pulse in carotid and radial arteries.;Review the importance of being able to check your own pulse for safety during independent exercise       Expected Outcomes Short Term: Able to explain why pulse checking is important during independent exercise;Long Term: Able to check pulse independently and accurately       Understanding of Exercise Prescription Yes       Intervention Provide education, explanation, and written materials on patient's individual exercise prescription       Expected Outcomes Short Term: Able to explain program exercise prescription;Long Term: Able to explain home exercise prescription to exercise independently              Exercise Goals Re-Evaluation:  Exercise Goals Re-Evaluation    Row Name 06/16/20 1600 06/25/20 1435 07/23/20 1430 08/08/20 1450       Exercise Goal Re-Evaluation   Exercise Goals Review Increase Physical Activity;Increase Strength and Stamina;Able to understand and use rate of perceived exertion (RPE) scale;Knowledge and understanding of Target Heart Rate Range (THRR);Able to check pulse independently;Understanding of Exercise Prescription Increase Physical Activity;Increase Strength and Stamina;Able to understand and use rate of perceived exertion (RPE) scale;Knowledge and understanding of Target Heart Rate Range (THRR);Able to check pulse independently;Understanding of Exercise Prescription Increase Physical Activity;Increase Strength and Stamina;Able to understand and use rate of perceived exertion (RPE) scale;Knowledge and understanding of Target Heart Rate Range (THRR);Able to check pulse independently;Understanding of Exercise Prescription Increase Physical Activity;Increase  Strength and Stamina;Able to understand and use rate of perceived exertion (RPE) scale;Knowledge and understanding of Target Heart Rate Range (THRR);Able to check pulse independently;Understanding of Exercise Prescription    Comments Pt's first day in   the CRP2 program. Pt tolerated exercise Rx well and understands exercise Rx, RPE scale, and THRR. Reviewed METs and Home Exercise Prescription. Pt encouraged to walk at home 2x/wk for 30 minutes. Pt verbalized understanding of home exercise Rx and was provided copy. Pt is making slow progress in CRP2 program. Pt is walking at home 2-3x/week for about 30 minutes. Pt demonstrates good attendance. Pt graduated from the CRP2 program. She made good progress and her average METs were 2.2. She plans to continue her exercise at the [facility].    Expected Outcomes Will continue to monitor patient and progress as tolerated. Pt will walk at home 2x/week for 30 minutes, Continue to monitor and progress patient as tolerated. Pt will exercise on her own at home and the [facility].           Nutrition & Weight - Outcomes:  Pre Biometrics - 06/12/20 1550      Pre Biometrics   Height 5' 7.5" (1.715 m)    Weight 115.7 kg    Waist Circumference 49 inches    Hip Circumference 47 inches    Waist to Hip Ratio 1.04 %    BMI (Calculated) 39.34    Triceps Skinfold 35 mm    % Body Fat 50.6 %    Grip Strength 34 kg    Flexibility 0 in    Single Leg Stand 2.1 seconds           Post Biometrics - 08/06/20 1435       Post  Biometrics   Height 5' 7.5" (1.715 m)    Weight 114 kg    Waist Circumference 49 inches    Hip Circumference 47 inches    Waist to Hip Ratio 1.04 %    BMI (Calculated) 38.76    Triceps Skinfold 34 mm    % Body Fat 50.3 %    Grip Strength 37 kg    Flexibility 0 in   Not done due to h/o LBP   Single Leg Stand 3.12 seconds           Nutrition:  Nutrition Therapy & Goals - 06/26/20 0721      Nutrition Therapy   Diet Heart Healthy       Personal Nutrition Goals   Nutrition Goal Pt to identify food quantities necessary to achieve weight loss of 6-24 lb at graduation from cardiac rehab.    Personal Goal #2 Pt to build a healthy plate including vegetables, fruits, whole grains, and low-fat dairy products in a heart healthy meal plan.    Personal Goal #3 Pt able to name foods that affect blood glucose      Intervention Plan   Intervention Prescribe, educate and counsel regarding individualized specific dietary modifications aiming towards targeted core components such as weight, hypertension, lipid management, diabetes, heart failure and other comorbidities.;Nutrition handout(s) given to patient.    Expected Outcomes Short Term Goal: A plan has been developed with personal nutrition goals set during dietitian appointment.;Long Term Goal: Adherence to prescribed nutrition plan.           Nutrition Discharge:  Nutrition Assessments - 08/19/20 1100      MEDFICTS Scores   Pre Score 44    Post Score 13    Score Difference -31           Education Questionnaire Score:  Knowledge Questionnaire Score - 08/04/20 1645      Knowledge Questionnaire Score   Post Score 23/24             Goals reviewed with patient; copy given to patient.

## 2020-08-25 DIAGNOSIS — H43812 Vitreous degeneration, left eye: Secondary | ICD-10-CM | POA: Diagnosis not present

## 2020-09-05 DIAGNOSIS — E1165 Type 2 diabetes mellitus with hyperglycemia: Secondary | ICD-10-CM | POA: Diagnosis not present

## 2020-09-05 DIAGNOSIS — Z955 Presence of coronary angioplasty implant and graft: Secondary | ICD-10-CM | POA: Diagnosis not present

## 2020-09-05 DIAGNOSIS — E039 Hypothyroidism, unspecified: Secondary | ICD-10-CM | POA: Diagnosis not present

## 2020-09-05 DIAGNOSIS — E7800 Pure hypercholesterolemia, unspecified: Secondary | ICD-10-CM | POA: Diagnosis not present

## 2020-09-08 ENCOUNTER — Other Ambulatory Visit: Payer: Self-pay | Admitting: Cardiology

## 2020-09-08 DIAGNOSIS — Z6837 Body mass index (BMI) 37.0-37.9, adult: Secondary | ICD-10-CM | POA: Diagnosis not present

## 2020-09-08 DIAGNOSIS — I10 Essential (primary) hypertension: Secondary | ICD-10-CM | POA: Diagnosis not present

## 2020-09-08 DIAGNOSIS — I2510 Atherosclerotic heart disease of native coronary artery without angina pectoris: Secondary | ICD-10-CM | POA: Diagnosis not present

## 2020-09-08 DIAGNOSIS — E1165 Type 2 diabetes mellitus with hyperglycemia: Secondary | ICD-10-CM | POA: Diagnosis not present

## 2020-09-08 DIAGNOSIS — E039 Hypothyroidism, unspecified: Secondary | ICD-10-CM | POA: Diagnosis not present

## 2020-10-13 ENCOUNTER — Other Ambulatory Visit: Payer: Self-pay | Admitting: Cardiology

## 2020-11-13 ENCOUNTER — Other Ambulatory Visit: Payer: Self-pay | Admitting: Cardiology

## 2020-11-17 ENCOUNTER — Other Ambulatory Visit: Payer: Self-pay | Admitting: Cardiology

## 2020-12-11 ENCOUNTER — Other Ambulatory Visit: Payer: Self-pay | Admitting: Cardiology

## 2020-12-11 DIAGNOSIS — I10 Essential (primary) hypertension: Secondary | ICD-10-CM

## 2020-12-15 ENCOUNTER — Other Ambulatory Visit: Payer: Self-pay

## 2020-12-15 DIAGNOSIS — I4892 Unspecified atrial flutter: Secondary | ICD-10-CM

## 2020-12-15 MED ORDER — METOPROLOL SUCCINATE ER 50 MG PO TB24: 50.0000 mg | ORAL_TABLET | Freq: Every morning | ORAL | 1 refills | Status: DC

## 2020-12-19 DIAGNOSIS — E782 Mixed hyperlipidemia: Secondary | ICD-10-CM | POA: Diagnosis not present

## 2020-12-19 DIAGNOSIS — I2510 Atherosclerotic heart disease of native coronary artery without angina pectoris: Secondary | ICD-10-CM | POA: Diagnosis not present

## 2020-12-20 LAB — LIPID PANEL WITH LDL/HDL RATIO
Cholesterol, Total: 160 mg/dL (ref 100–199)
HDL: 58 mg/dL (ref >39)
LDL Chol Calc (NIH): 68 mg/dL (ref 0–99)
LDL/HDL Ratio: 1.2 ratio (ref 0.0–3.2)
Triglycerides: 205 mg/dL — ABNORMAL HIGH (ref 0–149)
VLDL Cholesterol Cal: 34 mg/dL (ref 5–40)

## 2020-12-24 ENCOUNTER — Ambulatory Visit: Payer: PPO | Admitting: Cardiology

## 2020-12-24 NOTE — Progress Notes (Signed)
Patient is aware 

## 2021-01-02 ENCOUNTER — Ambulatory Visit: Payer: PPO | Admitting: Cardiology

## 2021-01-02 ENCOUNTER — Other Ambulatory Visit: Payer: Self-pay

## 2021-01-02 ENCOUNTER — Encounter: Payer: Self-pay | Admitting: Cardiology

## 2021-01-02 VITALS — BP 132/58 | HR 69 | Temp 97.5°F | Resp 16 | Ht 67.0 in | Wt 252.6 lb

## 2021-01-02 DIAGNOSIS — Z955 Presence of coronary angioplasty implant and graft: Secondary | ICD-10-CM

## 2021-01-02 DIAGNOSIS — Z7901 Long term (current) use of anticoagulants: Secondary | ICD-10-CM

## 2021-01-02 DIAGNOSIS — E119 Type 2 diabetes mellitus without complications: Secondary | ICD-10-CM

## 2021-01-02 DIAGNOSIS — Z6839 Body mass index (BMI) 39.0-39.9, adult: Secondary | ICD-10-CM | POA: Diagnosis not present

## 2021-01-02 DIAGNOSIS — I2510 Atherosclerotic heart disease of native coronary artery without angina pectoris: Secondary | ICD-10-CM | POA: Diagnosis not present

## 2021-01-02 DIAGNOSIS — I4892 Unspecified atrial flutter: Secondary | ICD-10-CM

## 2021-01-02 DIAGNOSIS — I10 Essential (primary) hypertension: Secondary | ICD-10-CM

## 2021-01-02 DIAGNOSIS — E782 Mixed hyperlipidemia: Secondary | ICD-10-CM

## 2021-01-02 NOTE — Progress Notes (Signed)
[name] Date of Birth: 1950-11-22 MRN: [id number] Primary Care Provider:[place], [place] Reader, [place] Primary Cardiologist: Rex Kras, DO, [facility]  (established care 02/04/2020).   Date: 01/02/21 Last Office Visit: 06/26/2020  Chief Complaint  Patient presents with  . Paroxysmal atrial flutter   . Coronary Artery Disease  . Follow-up  . Results    HPI  [name] is a 70 y.o. female who presents to the office with a chief complaint of " 77-month follow-up for paroxysmal atrial flutter and CAD." Patient's past medical history and cardiac risk factors include: Coronary artery disease status post angioplasty and stent, hypertension, hyperlipidemia, hypothyroidism, GERD, postmenopausal female, advanced age, non-insulin-dependent diabetes mellitus type 2, Paroxysmal atrial flutter during stress test.  Patient was referred to the office back in April 2021 for evaluation of an abnormal EKG and multiple cardiovascular risk factors.  Since then she is underwent extensive cardiovascular evaluation given her risk factors and objective findings.  Stress test noted reversible ischemia in the LAD distribution.  Subsequently underwent coronary CTA with CT FFR which reported significant hemodynamic stenosis in the LAD distribution.  She subsequently underwent left heart catheterization with successful PTCA and stent placement to the mid/proximal LAD distribution additional details noted below for further reference.  In the interim she was also diagnosed with paroxysmal atrial flutter for which she is currently on oral anticoagulation given her CHA2DS2-VASc score.  She now presents for 37-month follow-up.  From a cardiovascular standpoint she states that she is doing well.  She denies any chest pain or anginal equivalent.  No hospitalizations or urgent care visits for cardiovascular symptoms.  She has completed cardiac rehab since last office visit.  At the last office visit we discussed her fasting  lipid profile which was notable for elevated triglyceride levels.  At that time the shared decision was to improve her physical activity and dietary intake and to reevaluate prior to this visit.  Repeat fasting lipid profile notes that her triglycerides have increased compared to prior and LDL is 61 mg/dL  FUNCTIONAL STATUS: Walks 20-30 minutes three days a week.   ALLERGIES: Allergies  Allergen Reactions  . Benazepril Cough  . Ciprofloxacin Itching  . Naproxen Sodium Other (See Comments)    Stomach burns    MEDICATION LIST PRIOR TO VISIT: Current Outpatient Medications on File Prior to Visit  Medication Sig Dispense Refill  . acetaminophen (TYLENOL) 500 MG tablet Take 1,000 mg by mouth every 6 (six) hours as needed for mild pain or headache.    Marland Kitchen amLODipine (NORVASC) 10 MG tablet Take 10 mg by mouth daily.    Marland Kitchen atorvastatin (LIPITOR) 80 MG tablet Take 1 tablet by mouth once daily 90 tablet 0  . clopidogrel (PLAVIX) 75 MG tablet Take 1 tablet by mouth once daily with breakfast 90 tablet 0  . ELIQUIS 5 MG TABS tablet Take 1 tablet by mouth twice daily 180 tablet 1  . escitalopram (LEXAPRO) 20 MG tablet Take 20 mg by mouth daily.    Marland Kitchen estradiol (ESTRACE) 1 MG tablet Take 1 mg by mouth daily.     Marland Kitchen ezetimibe (ZETIA) 10 MG tablet Take 10 mg by mouth daily.    . hydrochlorothiazide (MICROZIDE) 12.5 MG capsule Take 1 capsule by mouth once daily in the morning 90 capsule 0  . irbesartan (AVAPRO) 300 MG tablet Take 300 mg by mouth daily.    Marland Kitchen levothyroxine (SYNTHROID, LEVOTHROID) 100 MCG tablet Take 100 mcg by mouth at bedtime.     Marland Kitchen  medroxyPROGESTERone (PROVERA) 5 MG tablet Take 5 mg by mouth daily.    . metFORMIN (GLUCOPHAGE-XR) 500 MG 24 hr tablet Take 500 mg by mouth daily.    . metoprolol succinate (TOPROL XL) 50 MG 24 hr tablet Take 1 tablet (50 mg total) by mouth in the morning. Hold if systolic blood pressure (top blood pressure number) less than 100 mmHg or heart rate less than 60 bpm  (pulse). 90 tablet 1  . pantoprazole (PROTONIX) 40 MG tablet Take 1 tablet by mouth once daily 90 tablet 0  . nitroGLYCERIN (NITROSTAT) 0.4 MG SL tablet Place 1 tablet (0.4 mg total) under the tongue every 5 (five) minutes as needed for chest pain. If you require more than two tablets five minutes apart go to the nearest ER via EMS. 30 tablet 0   No current facility-administered medications on file prior to visit.    PAST MEDICAL HISTORY: Past Medical History:  Diagnosis Date  . Arthritis   . Coronary artery disease   . Depression   . Diabetes mellitus without complication ([place])   . GERD (gastroesophageal reflux disease)   . Hypercholesteremia   . Hypertension   . Hypothyroidism   . Pre-diabetes 08/19/2019  . Sleep apnea    mild-modreate-tried cpap    PAST SURGICAL HISTORY: Past Surgical History:  Procedure Laterality Date  . CARPAL TUNNEL RELEASE     right  . COLONOSCOPY    . CORONARY ANGIOPLASTY WITH STENT PLACEMENT    . CORONARY STENT INTERVENTION N/A 03/18/2020   Procedure: CORONARY STENT INTERVENTION;  Surgeon: Nigel Mormon, MD;  Location: [place] [facility];  Service: Cardiovascular;  Laterality: N/A;  . DILATION AND CURETTAGE OF UTERUS    . INTRAVASCULAR ULTRASOUND/IVUS N/A 03/18/2020   Procedure: Intravascular Ultrasound/IVUS;  Surgeon: Nigel Mormon, MD;  Location: [place] [facility];  Service: Cardiovascular;  Laterality: N/A;  . LEFT HEART CATH AND CORONARY ANGIOGRAPHY N/A 03/18/2020   Procedure: LEFT HEART CATH AND CORONARY ANGIOGRAPHY;  Surgeon: Nigel Mormon, MD;  Location: [place] [facility];  Service: Cardiovascular;  Laterality: N/A;  . ORIF ANKLE FRACTURE  04/13/2012   Procedure: OPEN REDUCTION INTERNAL FIXATION (ORIF) ANKLE FRACTURE;  Surgeon: Wylene Simmer, MD;  Location: [facility];  Service: Orthopedics;  Laterality: Left;  . TONSILLECTOMY      FAMILY HISTORY: The patient family history includes Breast cancer in her sister;  Colon cancer in her father; Dementia in her mother; Heart attack in her father; Hypertension in her sister, sister, sister, and sister.   SOCIAL HISTORY:  The patient  reports that she has never smoked. She has never used smokeless tobacco. She reports current alcohol use. She reports that she does not use drugs.  REVIEW OF SYSTEMS: Review of Systems  Constitutional: Negative for chills, fever and malaise/fatigue.  HENT: Negative for ear discharge, ear pain and nosebleeds.   Eyes: Negative for blurred vision and discharge.  Cardiovascular: Negative for chest pain, claudication, dyspnea on exertion, leg swelling, near-syncope, orthopnea, palpitations, paroxysmal nocturnal dyspnea and syncope.  Respiratory: Negative for cough and shortness of breath.   Endocrine: Negative for polydipsia, polyphagia and polyuria.  Hematologic/Lymphatic: Negative for bleeding problem.  Skin: Negative for flushing and nail changes.  Musculoskeletal: Negative for muscle cramps, muscle weakness and myalgias.  Gastrointestinal: Negative for abdominal pain, dysphagia, hematemesis, hematochezia, melena, nausea and vomiting.  Neurological: Negative for dizziness, focal weakness and light-headedness.   PHYSICAL EXAM: Vitals with BMI 01/02/2021 08/06/2020 07/15/2020  Height  $'5\' 7"'c$  5' 7.5" -  Weight 252 lbs 10 oz 251 lbs 5 oz 253 lbs 1 oz  BMI 39.55 17.51 02.58  Systolic 527 - -  Diastolic 58 - -  Pulse 69 - -    CONSTITUTIONAL: Well-developed and well-nourished. No acute distress.  SKIN: Skin is warm and dry. No rash noted. No cyanosis. No pallor. No jaundice HEAD: Normocephalic and atraumatic.  EYES: No scleral icterus MOUTH/THROAT: Moist oral membranes.  NECK: No JVD present. No thyromegaly noted. No carotid bruits  LYMPHATIC: No visible cervical adenopathy.  CHEST Normal respiratory effort. No intercostal retractions  LUNGS: Clear to auscultation bilaterally.  No stridor. No wheezes. No rales.   CARDIOVASCULAR: Regular rate and rhythm, positive S1-S2, no murmurs rubs or gallops appreciated.   ABDOMINAL: Obese, soft, nontender, nondistended, positive bowel sounds in all 4 quadrants. No apparent ascites.  EXTREMITIES: No peripheral edema. Radial site is healing well without hematoma / bruit.   HEMATOLOGIC: No significant bruising NEUROLOGIC: Oriented to person, place, and time. Nonfocal. Normal muscle tone.  PSYCHIATRIC: Normal mood and affect. Normal behavior. Cooperative  CARDIAC DATABASE: EKG: 01/02/2021: Normal sinus rhythm, 67 bpm, normal axis, LVH per voltage criteria, diffuse ST-T changes most likely secondary to LVH.  No significant change compared to prior.  Echocardiogram: 02/22/2020: LVEF 59%, moderate concentric LVH, grade 2 diastolic dysfunction, severely dilated left atrium, mild TR.   Stress Testing: Lexiscan (Walking with mod Bruce)Tetrofosmin Stress Test  02/18/2020: Resting EKG/ECG demonstrated atypical atrial flutter with rapid ventricular response. Observed left ventricular hypertrophy with secondary ST-T changes, repolarization abnormality. Peak EKG/ECG revealed no significant ST-T change from baseline abnormality. Patient achieved 101% of MPHR with modified Bruce protocol.  During infusion the recovery ECG revealed occasional premature  There is a reversible mild defect in the distal anterior and apical regions.  Overall LV systolic function is normal with regional wall motion abnormalities in the same region. Stress LV EF: 55%.  Low risk in view of size of defect and preserved LVEF.    Coronary CTA w/ FFR.  03/07/2020:  1. Mild non-obstructive CAD of the proximal and mid-LAD and proximal LCx, CADRADS = 2. The mid-LAD stenosis may be more significant,however, there is blooming and alignment artifact. Will submit study for FFR, given the abnormal myoview results in the distal anterior and apical regions. 2. Coronary calcium score of 148. This was 82nd  percentile for age and sex matched control. 3. Normal coronary origin with right dominance. 4. Dilated main pulmonary artery to 37 mm, suggesting pulmonary hypertension. 5.  Aortic atherosclerosis CT FFR ANALYSIS 1. CT FFR demonstrates flow-limiting stenosis of the proximal to mid-LAD, just upstream of the D1 branch, affecting the mid to distal LAD and a large D2 branch. LAD: Significant stenosis. Proximal FFR = 0.96, Mid FFR = 0.66, Distal FFR = 0.60  Heart Catheterization: March 18, 2020: LM: Normal LAD: Prox 75-80% stenosis. Mild calcification.        Mid 75-80% stenosis. Mild calcification.        Prox D1 50% stenosis. LCx: Prox 20% stenosis. RCA: Prox 20% stenosis.       PTCA and stent placement 2.5 X 12 mm Resolute Onyx drug-eluting stent mid LAD     PTCA and stent placement 3.5 X 18 mm Resolute Onyx drug-eluting stent prox LAD  LABORATORY DATA: BMP Latest Ref Rng & Units 06/25/2020 03/19/2020 02/29/2020  Glucose 65 - 99 mg/dL 158(H) 132(H) 124(H)  BUN 8 - 27 mg/dL $Remove'15 12 10  'vkkENgq$ Creatinine 0.57 -  1.00 mg/dL 0.98 0.80 0.77  BUN/Creat Ratio 12 - 28 15 - 13  Sodium 134 - 144 mmol/L 141 137 141  Potassium 3.5 - 5.2 mmol/L 4.5 3.9 4.6  Chloride 96 - 106 mmol/L 103 107 104  CO2 20 - 29 mmol/L $RemoveB'21 22 22  'BkLupicq$ Calcium 8.7 - 10.3 mg/dL 10.2 8.9 9.9   CBC Latest Ref Rng & Units 06/25/2020 03/19/2020 02/29/2020  WBC 3.4 - 10.8 x10E3/uL 8.8 7.7 8.6  Hemoglobin 11.1 - 15.9 g/dL 12.6 11.5(L) 13.3  Hematocrit 34.0 - 46.6 % 37.1 37.4 39.8  Platelets 150 - 450 x10E3/uL 251 199 275   Lipid Panel     Component Value Date/Time   CHOL 160 12/19/2020 0808   TRIG 205 (H) 12/19/2020 0808   HDL 58 12/19/2020 0808   LDLCALC 68 12/19/2020 0808   LDLDIRECT 68 06/25/2020 0831   LABVLDL 34 12/19/2020 0808   Lipid profile: 07/11/2019: Total cholesterol 203, triglycerides 208, HDL 56, LDL 113 01/07/2020: Total cholesterol 231, triglycerides 303, HDL 55, LDL 123  Hemoglobin A1c: 07/11/2019: 6.6 01/07/2020:  6.6  TSH:  01/01/2019 1.62  Serum creatinine: 01/07/2020 0.98 mg/dL  FINAL MEDICATION LIST END OF ENCOUNTER: No orders of the defined types were placed in this encounter.    Current Outpatient Medications:  .  acetaminophen (TYLENOL) 500 MG tablet, Take 1,000 mg by mouth every 6 (six) hours as needed for mild pain or headache., Disp: , Rfl:  .  amLODipine (NORVASC) 10 MG tablet, Take 10 mg by mouth daily., Disp: , Rfl:  .  atorvastatin (LIPITOR) 80 MG tablet, Take 1 tablet by mouth once daily, Disp: 90 tablet, Rfl: 0 .  clopidogrel (PLAVIX) 75 MG tablet, Take 1 tablet by mouth once daily with breakfast, Disp: 90 tablet, Rfl: 0 .  ELIQUIS 5 MG TABS tablet, Take 1 tablet by mouth twice daily, Disp: 180 tablet, Rfl: 1 .  escitalopram (LEXAPRO) 20 MG tablet, Take 20 mg by mouth daily., Disp: , Rfl:  .  estradiol (ESTRACE) 1 MG tablet, Take 1 mg by mouth daily. , Disp: , Rfl:  .  ezetimibe (ZETIA) 10 MG tablet, Take 10 mg by mouth daily., Disp: , Rfl:  .  hydrochlorothiazide (MICROZIDE) 12.5 MG capsule, Take 1 capsule by mouth once daily in the morning, Disp: 90 capsule, Rfl: 0 .  irbesartan (AVAPRO) 300 MG tablet, Take 300 mg by mouth daily., Disp: , Rfl:  .  levothyroxine (SYNTHROID, LEVOTHROID) 100 MCG tablet, Take 100 mcg by mouth at bedtime. , Disp: , Rfl:  .  medroxyPROGESTERone (PROVERA) 5 MG tablet, Take 5 mg by mouth daily., Disp: , Rfl:  .  metFORMIN (GLUCOPHAGE-XR) 500 MG 24 hr tablet, Take 500 mg by mouth daily., Disp: , Rfl:  .  metoprolol succinate (TOPROL XL) 50 MG 24 hr tablet, Take 1 tablet (50 mg total) by mouth in the morning. Hold if systolic blood pressure (top blood pressure number) less than 100 mmHg or heart rate less than 60 bpm (pulse)., Disp: 90 tablet, Rfl: 1 .  pantoprazole (PROTONIX) 40 MG tablet, Take 1 tablet by mouth once daily, Disp: 90 tablet, Rfl: 0 .  nitroGLYCERIN (NITROSTAT) 0.4 MG SL tablet, Place 1 tablet (0.4 mg total) under the tongue every 5 (five)  minutes as needed for chest pain. If you require more than two tablets five minutes apart go to the nearest ER via EMS., Disp: 30 tablet, Rfl: 0  IMPRESSION:    ICD-10-CM   1. Paroxysmal atrial flutter (  [place])  I48.92 EKG 12-Lead  2. Long term (current) use of anticoagulants  Z79.01   3. Atherosclerosis of native coronary artery of native heart without angina pectoris  I25.10   4. Essential hypertension  I10   5. History of coronary angioplasty with insertion of stent  Z95.5   6. Type 2 diabetes mellitus without complication, without long-term current use of insulin (HCC)  E11.9   7. Mixed hyperlipidemia  E78.2 Lipid Panel With LDL/HDL Ratio    Digoxin level    CMP14+EGFR  8. Class 2 severe obesity due to excess calories with serious comorbidity and body mass index (BMI) of 39.0 to 39.9 in adult [facility])  E66.01    Z68.39      RECOMMENDATIONS: HEAVENLY CHRISTINE is a 70 y.o. female whose past medical history and cardiac risk factors include: Coronary artery disease status post angioplasty and stent to the proximal and mid LAD, hypertension, hyperlipidemia, hypothyroidism, GERD, postmenopausal female, advanced age, non-insulin-dependent diabetes mellitus type 2, paroxysmal atrial flutter during stress test.  Atherosclerosis of the native coronary arteries without angina pectoris and prior angioplasty and stenting:  No chest pain or anginal equivalent.  No use of sublingual nitroglycerin tablets since last office encounter.  Also completed cardiac rehab.  Most recent fasting lipid profile from 12/19/2020 independently reviewed with her in great detail at today's visit.  Triglycerides not at goal but LDL is less than 70 mg/dL.  Continue guideline directed medical therapy.    Educated on the importance of improving modifiable cardiovascular risk factors.  Paroxysmal atrial flutter: . Rate control: Metoprolol succinate. Marland Kitchen Rhythm control: N/A. Marland Kitchen Thromboembolic prophylaxis: Eliquis.    . CHA2DS2-VASc SCORE is 5 which correlates to 6.7% risk of stroke per year.  . Reemphasized the benefits, risks, and alternatives of anticoagulation.   Long-term oral anticoagulation:  Indication paroxysmal atrial flutter.  No prior history of intracranial bleeding or internal bleeding.  No recent surgeries.  Hemoglobin at the time [facility] was initiated was 13.3 g/dL, 12.6 g/dL (06/25/2020), 12.3 g/dL (09/05/2020)  Benign essential hypertension: . Office blood pressures within acceptable range.  Patient states that home blood pressure is better controlled.  . Medication reconciled.  . Low salt diet recommended. A diet that is rich in fruits, vegetables, legumes, and low-fat dairy products and low in snacks, sweets, and meats (such as the Dietary Approaches to Stop Hypertension [DASH] diet).   Mixed hyperlipidemia:  Continue Lipitor and Zetia.  Lipid profile prior to the next office visit  Currently managed by primary team.  Hypertriglyceridemia: At the last office visit we discussed the importance of elevated triglyceride levels and correlation to CAD.  The shared decision was to implement lifestyle modifications to see if her triglyceride levels improved.  Outside blood work that was received back in November 2021 did note improvement in her triglyceride levels but not at goal.. Blood work from March 2022 independently reviewed notes that the triglyceride levels have trended up.   She is reluctant to start pharmacological therapy for her elevated triglyceride levels and wants to be more aggressive with lifestyle modifications and increasing physical activity as summer is coming.  We will recheck it prior to the next visit.  Non-insulin-dependent diabetes mellitus type 2: Currently managed by primary team.  Obesity, due to excess calories: Body mass index is 39.56 kg/m. . I reviewed with the patient the importance of diet, regular physical activity/exercise, weight loss.   . Patient  is educated on increasing physical activity gradually as tolerated.  With the goal of moderate intensity exercise for 30 minutes a day 5 days a week.  Orders Placed This Encounter  Procedures  . Lipid Panel With LDL/HDL Ratio  . Digoxin level  . CMP14+EGFR  . EKG 12-Lead   --Continue cardiac medications as reconciled in final medication list. --Return in about 6 months (around 07/05/2021) for Follow up, CAD, A. fib. Or sooner if needed. --Continue follow-up with your primary care physician regarding the management of your other chronic comorbid conditions.  Patient's questions and concerns were addressed to her satisfaction. She voices understanding of the instructions provided during this encounter.   This note was created using a voice recognition software as a result there may be grammatical errors inadvertently enclosed that do not reflect the nature of this encounter. Every attempt is made to correct such errors.  Rex Kras, [place], [facility]  Pager: [phone] Office: [phone]

## 2021-01-09 DIAGNOSIS — Z961 Presence of intraocular lens: Secondary | ICD-10-CM | POA: Diagnosis not present

## 2021-01-09 DIAGNOSIS — H43812 Vitreous degeneration, left eye: Secondary | ICD-10-CM | POA: Diagnosis not present

## 2021-01-09 DIAGNOSIS — H26493 Other secondary cataract, bilateral: Secondary | ICD-10-CM | POA: Diagnosis not present

## 2021-01-09 DIAGNOSIS — H524 Presbyopia: Secondary | ICD-10-CM | POA: Diagnosis not present

## 2021-01-09 DIAGNOSIS — H35363 Drusen (degenerative) of macula, bilateral: Secondary | ICD-10-CM | POA: Diagnosis not present

## 2021-02-06 DIAGNOSIS — R7303 Prediabetes: Secondary | ICD-10-CM | POA: Diagnosis not present

## 2021-02-06 DIAGNOSIS — E039 Hypothyroidism, unspecified: Secondary | ICD-10-CM | POA: Diagnosis not present

## 2021-02-06 DIAGNOSIS — I10 Essential (primary) hypertension: Secondary | ICD-10-CM | POA: Diagnosis not present

## 2021-02-13 ENCOUNTER — Other Ambulatory Visit: Payer: Self-pay

## 2021-02-13 DIAGNOSIS — I10 Essential (primary) hypertension: Secondary | ICD-10-CM | POA: Diagnosis not present

## 2021-02-13 DIAGNOSIS — E039 Hypothyroidism, unspecified: Secondary | ICD-10-CM | POA: Diagnosis not present

## 2021-02-13 DIAGNOSIS — F418 Other specified anxiety disorders: Secondary | ICD-10-CM | POA: Diagnosis not present

## 2021-02-13 DIAGNOSIS — I2510 Atherosclerotic heart disease of native coronary artery without angina pectoris: Secondary | ICD-10-CM | POA: Diagnosis not present

## 2021-02-13 DIAGNOSIS — Z0000 Encounter for general adult medical examination without abnormal findings: Secondary | ICD-10-CM | POA: Diagnosis not present

## 2021-02-13 DIAGNOSIS — E1165 Type 2 diabetes mellitus with hyperglycemia: Secondary | ICD-10-CM | POA: Diagnosis not present

## 2021-02-13 MED ORDER — ELIQUIS 5 MG PO TABS: 1.0000 | ORAL_TABLET | Freq: Two times a day (BID) | ORAL | 1 refills | Status: AC

## 2021-02-16 ENCOUNTER — Other Ambulatory Visit: Payer: Self-pay | Admitting: Cardiology

## 2021-02-16 NOTE — Progress Notes (Signed)
Called pt no answer, left a vm

## 2021-02-16 NOTE — Progress Notes (Signed)
Pt called back, informed pt about lab results. Pt understood. And she mention that her PCP stopped her hormone medication, pt mention she started a menstrual.

## 2021-02-16 NOTE — Progress Notes (Signed)
External Labs: Collected: 02/06/2021 provided by PCP Hemoglobin 11.3 g/dL, hematocrit 36.2% Creatinine 0.81Mg/dL. Potassium 4.1 AST 15, ALT 19 eGFR: 79 mL/min per 1.73 m Lipid profile: Total cholesterol 171, triglycerides 223, HDL 68, LDL 67 Hemoglobin A1c: 7.1   Please inform the patient that her LDL levels have improved.  Triglycerides have improved but not at goal.    Please encourage her to decrease carbohydrate intake (bread, rice, pasta, sweets, pastries, alcohol intake).   Hemoglobin A1c has trended up current A1c 7.1 (compared to 12/2019 6.6)  Please inform her that her hemoglobin is 11.3 g/dL which is low compared to 06/2020 when it was 12.6 g/dL.  Please ask if she is having any symptoms of bleeding.

## 2021-02-17 ENCOUNTER — Other Ambulatory Visit: Payer: Self-pay | Admitting: Cardiology

## 2021-03-11 ENCOUNTER — Other Ambulatory Visit: Payer: Self-pay | Admitting: Cardiology

## 2021-03-11 DIAGNOSIS — I10 Essential (primary) hypertension: Secondary | ICD-10-CM

## 2021-03-13 DIAGNOSIS — F418 Other specified anxiety disorders: Secondary | ICD-10-CM | POA: Diagnosis not present

## 2021-03-31 DIAGNOSIS — E039 Hypothyroidism, unspecified: Secondary | ICD-10-CM | POA: Diagnosis not present

## 2021-03-31 DIAGNOSIS — N959 Unspecified menopausal and perimenopausal disorder: Secondary | ICD-10-CM | POA: Diagnosis not present

## 2021-03-31 DIAGNOSIS — I4892 Unspecified atrial flutter: Secondary | ICD-10-CM | POA: Diagnosis not present

## 2021-03-31 DIAGNOSIS — E1165 Type 2 diabetes mellitus with hyperglycemia: Secondary | ICD-10-CM | POA: Diagnosis not present

## 2021-03-31 DIAGNOSIS — Z7901 Long term (current) use of anticoagulants: Secondary | ICD-10-CM | POA: Diagnosis not present

## 2021-04-02 DIAGNOSIS — R7309 Other abnormal glucose: Secondary | ICD-10-CM | POA: Diagnosis not present

## 2021-04-03 DIAGNOSIS — Z7901 Long term (current) use of anticoagulants: Secondary | ICD-10-CM | POA: Diagnosis not present

## 2021-04-03 DIAGNOSIS — E1165 Type 2 diabetes mellitus with hyperglycemia: Secondary | ICD-10-CM | POA: Diagnosis not present

## 2021-04-06 DIAGNOSIS — I10 Essential (primary) hypertension: Secondary | ICD-10-CM | POA: Diagnosis not present

## 2021-04-06 DIAGNOSIS — E7800 Pure hypercholesterolemia, unspecified: Secondary | ICD-10-CM | POA: Diagnosis not present

## 2021-04-06 DIAGNOSIS — Z955 Presence of coronary angioplasty implant and graft: Secondary | ICD-10-CM | POA: Diagnosis not present

## 2021-04-06 DIAGNOSIS — E1165 Type 2 diabetes mellitus with hyperglycemia: Secondary | ICD-10-CM | POA: Diagnosis not present

## 2021-04-06 DIAGNOSIS — E039 Hypothyroidism, unspecified: Secondary | ICD-10-CM | POA: Diagnosis not present

## 2021-04-06 DIAGNOSIS — I4892 Unspecified atrial flutter: Secondary | ICD-10-CM | POA: Diagnosis not present

## 2021-04-13 ENCOUNTER — Telehealth: Payer: Self-pay

## 2021-04-13 NOTE — Telephone Encounter (Signed)
PT called and would like to know why she is on two blood thinners. The Eliquis 5mg  and Clopidogrel 75mg . Patient stated she has been having head aches, pt is also fatigued so she is not sure if it is from the blood thinners or not. She stated her PCP was questioning this.

## 2021-04-17 NOTE — Telephone Encounter (Signed)
Eliquis : for atrial flutter Plavix : she has two stents.  HA is less likely due to these medications as she has been on it for months if not years.

## 2021-04-22 NOTE — Telephone Encounter (Signed)
Attempted to call pt, no answer. Left vm requesting call back.

## 2021-04-23 NOTE — Telephone Encounter (Signed)
Second attempt to call pt, no answer. Unable to leave vm.

## 2021-04-23 NOTE — Telephone Encounter (Signed)
Pt aware.

## 2021-05-04 NOTE — Telephone Encounter (Signed)
Error

## 2021-06-12 ENCOUNTER — Other Ambulatory Visit: Payer: Self-pay | Admitting: Cardiology

## 2021-06-12 ENCOUNTER — Other Ambulatory Visit: Payer: Self-pay

## 2021-06-12 DIAGNOSIS — I10 Essential (primary) hypertension: Secondary | ICD-10-CM

## 2021-06-12 DIAGNOSIS — I4892 Unspecified atrial flutter: Secondary | ICD-10-CM

## 2021-06-12 MED ORDER — METOPROLOL SUCCINATE ER 50 MG PO TB24: 50.0000 mg | ORAL_TABLET | Freq: Every morning | ORAL | 1 refills | Status: DC

## 2021-07-06 ENCOUNTER — Ambulatory Visit: Payer: PPO | Admitting: Cardiology

## 2021-07-07 DIAGNOSIS — E7800 Pure hypercholesterolemia, unspecified: Secondary | ICD-10-CM | POA: Diagnosis not present

## 2021-07-07 DIAGNOSIS — I10 Essential (primary) hypertension: Secondary | ICD-10-CM | POA: Diagnosis not present

## 2021-07-07 DIAGNOSIS — Z955 Presence of coronary angioplasty implant and graft: Secondary | ICD-10-CM | POA: Diagnosis not present

## 2021-07-07 DIAGNOSIS — E039 Hypothyroidism, unspecified: Secondary | ICD-10-CM | POA: Diagnosis not present

## 2021-07-07 DIAGNOSIS — E1165 Type 2 diabetes mellitus with hyperglycemia: Secondary | ICD-10-CM | POA: Diagnosis not present

## 2021-07-07 DIAGNOSIS — I4892 Unspecified atrial flutter: Secondary | ICD-10-CM | POA: Diagnosis not present

## 2021-07-22 DIAGNOSIS — E782 Mixed hyperlipidemia: Secondary | ICD-10-CM | POA: Diagnosis not present

## 2021-07-23 LAB — LDL CHOLESTEROL, DIRECT: LDL Direct: 53 mg/dL (ref 0–99)

## 2021-07-23 LAB — LIPID PANEL WITH LDL/HDL RATIO
Cholesterol, Total: 150 mg/dL (ref 100–199)
HDL: 55 mg/dL (ref >39)
LDL Chol Calc (NIH): 59 mg/dL (ref 0–99)
LDL/HDL Ratio: 1.1 ratio (ref 0.0–3.2)
Triglycerides: 225 mg/dL — ABNORMAL HIGH (ref 0–149)
VLDL Cholesterol Cal: 36 mg/dL (ref 5–40)

## 2021-07-23 LAB — CMP14+EGFR
ALT: 12 IU/L (ref 0–32)
AST: 15 IU/L (ref 0–40)
Albumin/Globulin Ratio: 1.7 (ref 1.2–2.2)
Albumin: 4.7 g/dL (ref 3.8–4.8)
Alkaline Phosphatase: 77 IU/L (ref 44–121)
BUN/Creatinine Ratio: 12 (ref 12–28)
BUN: 15 mg/dL (ref 8–27)
Bilirubin Total: 0.4 mg/dL (ref 0.0–1.2)
CO2: 17 mmol/L — ABNORMAL LOW (ref 20–29)
Calcium: 10.5 mg/dL — ABNORMAL HIGH (ref 8.7–10.3)
Chloride: 102 mmol/L (ref 96–106)
Creatinine, Ser: 1.26 mg/dL — ABNORMAL HIGH (ref 0.57–1.00)
Globulin, Total: 2.7 g/dL (ref 1.5–4.5)
Glucose: 136 mg/dL — ABNORMAL HIGH (ref 70–99)
Potassium: 4.2 mmol/L (ref 3.5–5.2)
Sodium: 142 mmol/L (ref 134–144)
Total Protein: 7.4 g/dL (ref 6.0–8.5)
eGFR: 47 mL/min/{1.73_m2} — ABNORMAL LOW (ref >59)

## 2021-07-24 NOTE — Progress Notes (Signed)
Called and spoke to pt, pt voiced understanding.

## 2021-07-27 ENCOUNTER — Other Ambulatory Visit: Payer: Self-pay

## 2021-07-27 ENCOUNTER — Encounter: Payer: Self-pay | Admitting: Cardiology

## 2021-07-27 ENCOUNTER — Ambulatory Visit: Payer: PPO | Admitting: Cardiology

## 2021-07-27 VITALS — BP 110/62 | HR 67 | Temp 98.0°F | Resp 17 | Ht 67.0 in | Wt 227.4 lb

## 2021-07-27 DIAGNOSIS — I10 Essential (primary) hypertension: Secondary | ICD-10-CM

## 2021-07-27 DIAGNOSIS — Z7901 Long term (current) use of anticoagulants: Secondary | ICD-10-CM

## 2021-07-27 DIAGNOSIS — Z6835 Body mass index (BMI) 35.0-35.9, adult: Secondary | ICD-10-CM

## 2021-07-27 DIAGNOSIS — I2510 Atherosclerotic heart disease of native coronary artery without angina pectoris: Secondary | ICD-10-CM

## 2021-07-27 DIAGNOSIS — Z955 Presence of coronary angioplasty implant and graft: Secondary | ICD-10-CM

## 2021-07-27 DIAGNOSIS — I4892 Unspecified atrial flutter: Secondary | ICD-10-CM | POA: Diagnosis not present

## 2021-07-27 DIAGNOSIS — E782 Mixed hyperlipidemia: Secondary | ICD-10-CM | POA: Diagnosis not present

## 2021-07-27 DIAGNOSIS — E119 Type 2 diabetes mellitus without complications: Secondary | ICD-10-CM | POA: Diagnosis not present

## 2021-07-27 MED ORDER — ICOSAPENT ETHYL 1 G PO CAPS: 2.0000 g | ORAL_CAPSULE | Freq: Two times a day (BID) | ORAL | 2 refills | Status: DC

## 2021-07-27 MED ORDER — ASPIRIN EC 81 MG PO TBEC: 81.0000 mg | DELAYED_RELEASE_TABLET | Freq: Every day | ORAL | 11 refills | Status: DC

## 2021-07-27 NOTE — Progress Notes (Signed)
[name] Date of Birth: 27-Nov-1950 MRN: [id number] Primary Care Provider:[place], [place] Reader, [place] Primary Cardiologist: Rex Kras, DO, [facility]  (established care 02/04/2020).   Date: 07/27/21 Last Office Visit: 01/02/2021   Chief Complaint  Patient presents with   Follow-up    6 months   Coronary Artery Disease   Atrial Flutter    HPI  [name] is a 70 y.o. female who presents to the office with a chief complaint of " 33-monthevaluation for CAD and atrial flutter." Patient's past medical history and cardiac risk factors include: Coronary artery disease status post angioplasty and stent, hypertension, hyperlipidemia, hypothyroidism, GERD, postmenopausal female, advanced age, non-insulin-dependent diabetes mellitus type 2, Paroxysmal atrial flutter during stress test.  Patient was referred to the office back in April 2021 for evaluation of an abnormal EKG and multiple cardiovascular risk factors.  In the past patient has undergone extensive cardiovascular evaluation as discussed below.  In summary, stress test noted ischemia in the LAD distribution; however, she wanted to proceed with coronary CTA as opposed to invasive angiography.  Coronary CTA noted hemodynamically significant stenosis in the LAD distribution and she subsequently underwent PCI to the LAD.  In the interim she was diagnosed with paroxysmal atrial fibrillation and was started on oral anticoagulation with regards to thromboembolic prophylaxis and rate control strategy.  She is currently normal sinus rhythm.  Patient was educated on importance of improving her cardiovascular risk factors and is here for 61-monthollow-up.  Over the last 6 months patient has not required the use of sublingual nitroglycerin tablets.  She is chest pain-free.  She is not in congestive heart failure.  No hospitalizations or urgent care visits for cardiovascular symptoms.  She has been started on Ozempic and with lifestyle changes has  lost approximately 25 pounds.  Her most recent labs from 07/22/2021 were independently reviewed.  Patient's serum creatinine was uptrending and therefore was recommended to discontinue hydrochlorothiazide and have her blood pressures remained stable.  FUNCTIONAL STATUS: Walks 20-30 minutes three days a week.   ALLERGIES: Allergies  Allergen Reactions   Benazepril Cough   Ciprofloxacin Itching   Naproxen Sodium Other (See Comments)    Stomach burns    MEDICATION LIST PRIOR TO VISIT: Current Outpatient Medications on File Prior to Visit  Medication Sig Dispense Refill   acetaminophen (TYLENOL) 500 MG tablet Take 1,000 mg by mouth every 6 (six) hours as needed for mild pain or headache.     amLODipine (NORVASC) 10 MG tablet Take 10 mg by mouth daily.     apixaban (ELIQUIS) 5 MG TABS tablet Take 1 tablet (5 mg total) by mouth 2 (two) times daily. 180 tablet 1   atorvastatin (LIPITOR) 80 MG tablet Take 1 tablet by mouth once daily 90 tablet 1   escitalopram (LEXAPRO) 20 MG tablet Take 20 mg by mouth daily.     ezetimibe (ZETIA) 10 MG tablet Take 1 tablet by mouth once daily 90 tablet 1   irbesartan (AVAPRO) 300 MG tablet Take 300 mg by mouth daily.     levothyroxine (SYNTHROID, LEVOTHROID) 100 MCG tablet Take 100 mcg by mouth at bedtime.      metFORMIN (GLUCOPHAGE-XR) 500 MG 24 hr tablet Take 1,000 mg by mouth daily.     metoprolol succinate (TOPROL XL) 50 MG 24 hr tablet Take 1 tablet (50 mg total) by mouth in the morning. Hold if systolic blood pressure (top blood pressure number) less than 100 mmHg or heart rate less  than 60 bpm (pulse). 90 tablet 1   nitroGLYCERIN (NITROSTAT) 0.4 MG SL tablet Place 1 tablet (0.4 mg total) under the tongue every 5 (five) minutes as needed for chest pain. If you require more than two tablets five minutes apart go to the nearest ER via EMS. 30 tablet 0   pantoprazole (PROTONIX) 40 MG tablet Take 1 tablet by mouth once daily 90 tablet 1   No current  facility-administered medications on file prior to visit.    PAST MEDICAL HISTORY: Past Medical History:  Diagnosis Date   Arthritis    Coronary artery disease    Depression    Diabetes mellitus without complication (HCC)    GERD (gastroesophageal reflux disease)    Hypercholesteremia    Hypertension    Hypothyroidism    Pre-diabetes 08/19/2019   Sleep apnea    mild-modreate-tried cpap    PAST SURGICAL HISTORY: Past Surgical History:  Procedure Laterality Date   CARPAL TUNNEL RELEASE     right   COLONOSCOPY     CORONARY ANGIOPLASTY WITH STENT PLACEMENT     CORONARY STENT INTERVENTION N/A 03/18/2020   Procedure: CORONARY STENT INTERVENTION;  Surgeon: Nigel Mormon, MD;  Location: [place];  Service: Cardiovascular;  Laterality: N/A;   DILATION AND CURETTAGE OF UTERUS     INTRAVASCULAR ULTRASOUND/IVUS N/A 03/18/2020   Procedure: Intravascular Ultrasound/IVUS;  Surgeon: Nigel Mormon, MD;  Location: [place] [facility];  Service: Cardiovascular;  Laterality: N/A;   LEFT HEART CATH AND CORONARY ANGIOGRAPHY N/A 03/18/2020   Procedure: LEFT HEART CATH AND CORONARY ANGIOGRAPHY;  Surgeon: Nigel Mormon, MD;  Location: [facility];  Service: Cardiovascular;  Laterality: N/A;   ORIF ANKLE FRACTURE  04/13/2012   Procedure: OPEN REDUCTION INTERNAL FIXATION (ORIF) ANKLE FRACTURE;  Surgeon: Wylene Simmer, MD;  Location: [facility];  Service: Orthopedics;  Laterality: Left;   TONSILLECTOMY      FAMILY HISTORY: The patient family history includes Breast cancer in her sister; Colon cancer in her father; Dementia in her mother; Heart attack in her father; Hypertension in her sister, sister, sister, and sister.   SOCIAL HISTORY:  The patient  reports that she has never smoked. She has never used smokeless tobacco. She reports current alcohol use. She reports that she does not use drugs.  REVIEW OF SYSTEMS: Review of Systems  Constitutional:  Negative for chills, fever and malaise/fatigue.  HENT:  Negative for ear discharge, ear pain and nosebleeds.   Eyes:  Negative for blurred vision and discharge.  Cardiovascular:  Negative for chest pain, claudication, dyspnea on exertion, leg swelling, near-syncope, orthopnea, palpitations, paroxysmal nocturnal dyspnea and syncope.  Respiratory:  Negative for cough and shortness of breath.   Endocrine: Negative for polydipsia, polyphagia and polyuria.  Hematologic/Lymphatic: Negative for bleeding problem.  Skin:  Negative for flushing and nail changes.  Musculoskeletal:  Negative for muscle cramps, muscle weakness and myalgias.  Gastrointestinal:  Negative for abdominal pain, dysphagia, hematemesis, hematochezia, melena, nausea and vomiting.  Neurological:  Negative for dizziness, focal weakness and light-headedness.   PHYSICAL EXAM: Vitals with BMI 07/27/2021 01/02/2021 08/06/2020  Height _0  _1  5' 7.5"  Weight 227 lbs 6 oz 252 lbs 10 oz 251 lbs 5 oz  BMI 35.61 74.25 95.63  Systolic 875 643 -  Diastolic 62 58 -  Pulse 67 69 -    CONSTITUTIONAL: Well-developed and well-nourished. No acute distress.  SKIN: Skin is warm and dry. No rash noted. No  cyanosis. No pallor. No jaundice HEAD: Normocephalic and atraumatic.  EYES: No scleral icterus MOUTH/THROAT: Moist oral membranes.  NECK: No JVD present. No thyromegaly noted. No carotid bruits  LYMPHATIC: No visible cervical adenopathy.  CHEST Normal respiratory effort. No intercostal retractions  LUNGS: Clear to auscultation bilaterally.  No stridor. No wheezes. No rales.  CARDIOVASCULAR: Regular rate and rhythm, positive S1-S2, no murmurs rubs or gallops appreciated.   ABDOMINAL: Obese, soft, nontender, nondistended, positive bowel sounds in all 4 quadrants. No apparent ascites.  EXTREMITIES: No peripheral edema. Radial site is healing well without hematoma / bruit.   HEMATOLOGIC: No significant bruising NEUROLOGIC: Oriented to  person, place, and time. Nonfocal. Normal muscle tone.  PSYCHIATRIC: Normal mood and affect. Normal behavior. Cooperative  CARDIAC DATABASE: EKG: 07/27/2021: Normal sinus rhythm, 64 bpm, normal axis, ST-T changes in the high lateral and lateral leads suggestive of possible ischemia, without underlying injury pattern.  No significant change EKG dated 01/07/2021.  Echocardiogram: 02/22/2020: LVEF 59%, moderate concentric LVH, grade 2 diastolic dysfunction, severely dilated left atrium, mild TR.   Stress Testing: Lexiscan (Walking with mod Bruce)Tetrofosmin Stress Test  02/18/2020: Resting EKG/ECG demonstrated atypical atrial flutter with rapid ventricular response. Observed left ventricular hypertrophy with secondary ST-T changes, repolarization abnormality. Peak EKG/ECG revealed no significant ST-T change from baseline abnormality. Patient achieved 101% of MPHR with modified Bruce protocol.  During infusion the recovery ECG revealed occasional premature  There is a reversible mild defect in the distal anterior and apical regions.  Overall LV systolic function is normal with regional wall motion abnormalities in the same region. Stress LV EF: 55%.  Low risk in view of size of defect and preserved LVEF.    Coronary CTA w/ FFR.  03/07/2020:  1. Mild non-obstructive CAD of the proximal and mid-LAD and proximal LCx, CADRADS = 2. The mid-LAD stenosis may be more significant,however, there is blooming and alignment artifact. Will submit study for FFR, given the abnormal myoview results in the distal anterior and apical regions. 2. Coronary calcium score of 148. This was 82nd percentile for age and sex matched control. 3. Normal coronary origin with right dominance. 4. Dilated main pulmonary artery to 37 mm, suggesting pulmonary hypertension. 5.  Aortic atherosclerosis CT FFR ANALYSIS 1. CT FFR demonstrates flow-limiting stenosis of the proximal to mid-LAD, just upstream of the D1 branch,  affecting the mid to distal LAD and a large D2 branch. LAD: Significant stenosis. Proximal FFR = 0.96, Mid FFR = 0.66, Distal FFR = 0.60  Heart Catheterization: March 18, 2020: LM: Normal LAD: Prox 75-80% stenosis. Mild calcification.        Mid 75-80% stenosis. Mild calcification.        Prox D1 50% stenosis. LCx: Prox 20% stenosis. RCA: Prox 20% stenosis.        PTCA and stent placement 2.5 X 12 mm Resolute Onyx drug-eluting stent mid LAD     PTCA and stent placement 3.5 X 18 mm Resolute Onyx drug-eluting stent prox LAD  LABORATORY DATA: BMP Latest Ref Rng & Units 07/22/2021 06/25/2020 03/19/2020  Glucose 70 - 99 mg/dL 136(H) 158(H) 132(H)  BUN 8 - 27 mg/dL _0 Creatinine 0.57 - 1.00 mg/dL 1.26(H) 0.98 0.80  BUN/Creat Ratio 12 - _1 -  Sodium 134 - 144 mmol/L 142 141 137  Potassium 3.5 - 5.2 mmol/L 4.2 4.5 3.9  Chloride 96 - 106 mmol/L 102 103 107  CO2 20 - 29 mmol/L 17(L) 21 22  Calcium 8.7 -  10.3 mg/dL 10.5(H) 10.2 8.9   CBC Latest Ref Rng & Units 06/25/2020 03/19/2020 02/29/2020  WBC 3.4 - 10.8 x10E3/uL 8.8 7.7 8.6  Hemoglobin 11.1 - 15.9 g/dL 12.6 11.5(L) 13.3  Hematocrit 34.0 - 46.6 % 37.1 37.4 39.8  Platelets 150 - 450 x10E3/uL 251 199 275   Lipid Panel     Component Value Date/Time   CHOL 150 07/22/2021 0838   TRIG 225 (H) 07/22/2021 0838   HDL 55 07/22/2021 0838   LDLCALC 59 07/22/2021 0838   LDLDIRECT 53 07/22/2021 0839   LABVLDL 36 07/22/2021 0838   Lipid profile: 07/11/2019: Total cholesterol 203, triglycerides 208, HDL 56, LDL 113 01/07/2020: Total cholesterol 231, triglycerides 303, HDL 55, LDL 123  Hemoglobin A1c: 07/11/2019: 6.6 01/07/2020: 6.6  TSH:  01/01/2019 1.62  Serum creatinine: 01/07/2020 0.98 mg/dL  FINAL MEDICATION LIST END OF ENCOUNTER: Meds ordered this encounter  Medications   aspirin EC 81 MG tablet    Sig: Take 1 tablet (81 mg total) by mouth daily. Swallow whole.    Dispense:  30 tablet    Refill:  11   icosapent Ethyl (VASCEPA)  1 g capsule    Sig: Take 2 capsules (2 g total) by mouth 2 (two) times daily.    Dispense:  120 capsule    Refill:  2      Current Outpatient Medications:    acetaminophen (TYLENOL) 500 MG tablet, Take 1,000 mg by mouth every 6 (six) hours as needed for mild pain or headache., Disp: , Rfl:    amLODipine (NORVASC) 10 MG tablet, Take 10 mg by mouth daily., Disp: , Rfl:    apixaban (ELIQUIS) 5 MG TABS tablet, Take 1 tablet (5 mg total) by mouth 2 (two) times daily., Disp: 180 tablet, Rfl: 1   aspirin EC 81 MG tablet, Take 1 tablet (81 mg total) by mouth daily. Swallow whole., Disp: 30 tablet, Rfl: 11   atorvastatin (LIPITOR) 80 MG tablet, Take 1 tablet by mouth once daily, Disp: 90 tablet, Rfl: 1   escitalopram (LEXAPRO) 20 MG tablet, Take 20 mg by mouth daily., Disp: , Rfl:    ezetimibe (ZETIA) 10 MG tablet, Take 1 tablet by mouth once daily, Disp: 90 tablet, Rfl: 1   icosapent Ethyl (VASCEPA) 1 g capsule, Take 2 capsules (2 g total) by mouth 2 (two) times daily., Disp: 120 capsule, Rfl: 2   irbesartan (AVAPRO) 300 MG tablet, Take 300 mg by mouth daily., Disp: , Rfl:    levothyroxine (SYNTHROID, LEVOTHROID) 100 MCG tablet, Take 100 mcg by mouth at bedtime. , Disp: , Rfl:    metFORMIN (GLUCOPHAGE-XR) 500 MG 24 hr tablet, Take 1,000 mg by mouth daily., Disp: , Rfl:    metoprolol succinate (TOPROL XL) 50 MG 24 hr tablet, Take 1 tablet (50 mg total) by mouth in the morning. Hold if systolic blood pressure (top blood pressure number) less than 100 mmHg or heart rate less than 60 bpm (pulse)., Disp: 90 tablet, Rfl: 1   nitroGLYCERIN (NITROSTAT) 0.4 MG SL tablet, Place 1 tablet (0.4 mg total) under the tongue every 5 (five) minutes as needed for chest pain. If you require more than two tablets five minutes apart go to the nearest ER via EMS., Disp: 30 tablet, Rfl: 0   pantoprazole (PROTONIX) 40 MG tablet, Take 1 tablet by mouth once daily, Disp: 90 tablet, Rfl: 1  IMPRESSION:    ICD-10-CM   1.  Atherosclerosis of native coronary artery of native heart without angina  pectoris  I25.10 aspirin EC 81 MG tablet    icosapent Ethyl (VASCEPA) 1 g capsule    Lipid Panel With LDL/HDL Ratio    LDL cholesterol, direct    CMP14+EGFR    2. History of coronary angioplasty with insertion of stent  Z95.5 aspirin EC 81 MG tablet    3. Paroxysmal atrial flutter (HCC)  I48.92 EKG 12-Lead    4. Long term (current) use of anticoagulants  Z79.01     5. Essential hypertension  I10     6. Type 2 diabetes mellitus without complication, without long-term current use of insulin (HCC)  E11.9     7. Mixed hyperlipidemia  E78.2     8. Class 2 severe obesity due to excess calories with serious comorbidity and body mass index (BMI) of 35.0 to 35.9 in adult [facility])  E66.01    Z68.35        RECOMMENDATIONS: [name] is a 70 y.o. female whose past medical history and cardiac risk factors include: Coronary artery disease status post angioplasty and stent to the proximal and mid LAD, hypertension, hyperlipidemia, hypothyroidism, GERD, postmenopausal female, advanced age, non-insulin-dependent diabetes mellitus type 2, paroxysmal atrial flutter during stress test.  Atherosclerosis of native coronary artery of native heart without angina pectoris Anginal free No recent hospitalizations or urgent care visits for cardiovascular symptoms Medications reconciled. Has been on Plavix for more than a year with regards to her PCI to the LAD performed in July 2021 Shared decision was to discontinue Plavix and transition her to aspirin 81 mg p.o. daily Medications reconciled. EKG no significant change compared to prior. Given underlying CAD and continued hypertriglyceridemia shared decision was to initiate pharmacological therapy. Start Vascepa Blood work in 6 weeks to reevaluate therapy As long as her blood work remained stable we will follow-up in 6 months.  Paroxysmal atrial flutter (HCC) Rate control:  Toprol-XL. Rhythm control: N/A. Thromboembolic prophylaxis: Eliquis. CHA2DS2-VASc SCORE is 5 which correlates to 6.7% risk of stroke per year.  Long term (current) use of anticoagulants Indication: Paroxysmal atrial flutter. Does not endorse any evidence of bleeding. Reemphasized the risks, benefits, and alternatives to oral anticoagulation.  Essential hypertension Office blood pressures are very well controlled. Given her renal function, weight loss of approximately 25 pounds, recommend continuing to hold HCTZ for now. Reemphasized the importance of low-salt diet Continue to monitor  Type 2 diabetes mellitus without complication, without long-term current use of insulin ([place]) Patient has been started on Ozempic by primary team.  Has lost significant amount of weight since last visit. Currently on ARB, statin. May consider Jardiance/Farxiga-will defer to primary team.  Mixed hyperlipidemia Currently on atorvastatin.   She denies myalgia or other side effects. Most recent lipids dated 07/22/2021 reviewed as noted above.  Class 2 severe obesity due to excess calories with serious comorbidity and body mass index (BMI) of 35.0 to 35.9 in adult [facility]) Body mass index is 35.62 kg/m. Patient has lost approximately 25 pounds since last office visit.  She is congratulated on Museum/gallery curator. I reviewed with the patient the importance of diet, regular physical activity/exercise, weight loss.   Patient is educated on increasing physical activity gradually as tolerated.  With the goal of moderate intensity exercise for 30 minutes a day 5 days a week.   Orders Placed This Encounter  Procedures   Lipid Panel With LDL/HDL Ratio   LDL cholesterol, direct   CMP14+EGFR   EKG 12-Lead   --Continue cardiac medications as reconciled in  final medication list. --Return in about 6 months (around 01/25/2022). Or sooner if needed. --Continue follow-up with your primary care physician regarding the  management of your other chronic comorbid conditions.  Patient's questions and concerns were addressed to her satisfaction. She voices understanding of the instructions provided during this encounter.   This note was created using a voice recognition software as a result there may be grammatical errors inadvertently enclosed that do not reflect the nature of this encounter. Every attempt is made to correct such errors.  Rex Kras, [place], [facility]  Pager: [phone] Office: [phone]

## 2021-08-05 ENCOUNTER — Telehealth: Payer: Self-pay | Admitting: Cardiology

## 2021-08-05 ENCOUNTER — Other Ambulatory Visit: Payer: Self-pay

## 2021-08-05 DIAGNOSIS — I2510 Atherosclerotic heart disease of native coronary artery without angina pectoris: Secondary | ICD-10-CM

## 2021-08-05 NOTE — Telephone Encounter (Signed)
Patient wants to know if we have more samples of eliquis 5mg . If so, she would like to pick some up.

## 2021-08-06 NOTE — Telephone Encounter (Signed)
Done

## 2021-08-14 DIAGNOSIS — E1165 Type 2 diabetes mellitus with hyperglycemia: Secondary | ICD-10-CM | POA: Diagnosis not present

## 2021-08-20 ENCOUNTER — Other Ambulatory Visit: Payer: Self-pay | Admitting: Cardiology

## 2021-08-21 DIAGNOSIS — Z7901 Long term (current) use of anticoagulants: Secondary | ICD-10-CM | POA: Diagnosis not present

## 2021-08-21 DIAGNOSIS — E1165 Type 2 diabetes mellitus with hyperglycemia: Secondary | ICD-10-CM | POA: Diagnosis not present

## 2021-08-21 DIAGNOSIS — I2510 Atherosclerotic heart disease of native coronary artery without angina pectoris: Secondary | ICD-10-CM | POA: Diagnosis not present

## 2021-08-21 DIAGNOSIS — I4892 Unspecified atrial flutter: Secondary | ICD-10-CM | POA: Diagnosis not present

## 2021-08-21 DIAGNOSIS — I10 Essential (primary) hypertension: Secondary | ICD-10-CM | POA: Diagnosis not present

## 2021-08-22 ENCOUNTER — Other Ambulatory Visit: Payer: Self-pay | Admitting: Cardiology

## 2021-08-23 ENCOUNTER — Other Ambulatory Visit: Payer: Self-pay | Admitting: Cardiology

## 2021-08-26 ENCOUNTER — Telehealth: Payer: Self-pay

## 2021-08-26 ENCOUNTER — Telehealth: Payer: Self-pay | Admitting: Cardiology

## 2021-08-28 NOTE — Telephone Encounter (Signed)
NO Plavix.  I transitioned her to ASA 81mg  po qday at the last office visit - as discussed at the last OV from Oct. 2021.   Dr. Terri Skains

## 2021-08-28 NOTE — Telephone Encounter (Signed)
Message was informed

## 2021-10-22 ENCOUNTER — Telehealth: Payer: Self-pay | Admitting: Cardiology

## 2021-10-22 NOTE — Telephone Encounter (Signed)
Who discontinued her Eliquis?

## 2021-10-22 NOTE — Telephone Encounter (Signed)
No one she just ran out of it today.

## 2021-10-22 NOTE — Telephone Encounter (Signed)
Can we provide samples and see if she can get patient assistance / co-pay card?

## 2021-10-23 NOTE — Telephone Encounter (Signed)
Still apply for patient's assistance.  Lets reach out to her on Monday via phone to discuss it further.

## 2021-10-23 NOTE — Telephone Encounter (Signed)
Pt got samples from her PCP

## 2021-11-04 ENCOUNTER — Other Ambulatory Visit: Payer: Self-pay | Admitting: Cardiology

## 2021-11-04 DIAGNOSIS — I2510 Atherosclerotic heart disease of native coronary artery without angina pectoris: Secondary | ICD-10-CM

## 2021-11-16 ENCOUNTER — Encounter (HOSPITAL_BASED_OUTPATIENT_CLINIC_OR_DEPARTMENT_OTHER): Payer: Self-pay | Admitting: Emergency Medicine

## 2021-11-16 ENCOUNTER — Emergency Department (HOSPITAL_BASED_OUTPATIENT_CLINIC_OR_DEPARTMENT_OTHER): Payer: Medicare HMO | Admitting: Radiology

## 2021-11-16 ENCOUNTER — Emergency Department (HOSPITAL_BASED_OUTPATIENT_CLINIC_OR_DEPARTMENT_OTHER)
Admission: EM | Admit: 2021-11-16 | Discharge: 2021-11-16 | Disposition: A | Discharge status: Home / Self Care | Payer: Medicare HMO | Attending: Emergency Medicine | Admitting: Emergency Medicine

## 2021-11-16 ENCOUNTER — Other Ambulatory Visit: Payer: Self-pay

## 2021-11-16 ENCOUNTER — Telehealth: Payer: Self-pay | Admitting: Cardiology

## 2021-11-16 DIAGNOSIS — R002 Palpitations: Secondary | ICD-10-CM | POA: Insufficient documentation

## 2021-11-16 DIAGNOSIS — R0789 Other chest pain: Secondary | ICD-10-CM | POA: Diagnosis not present

## 2021-11-16 DIAGNOSIS — Z79899 Other long term (current) drug therapy: Secondary | ICD-10-CM | POA: Diagnosis not present

## 2021-11-16 DIAGNOSIS — Z7982 Long term (current) use of aspirin: Secondary | ICD-10-CM | POA: Diagnosis not present

## 2021-11-16 DIAGNOSIS — R079 Chest pain, unspecified: Secondary | ICD-10-CM | POA: Diagnosis not present

## 2021-11-16 DIAGNOSIS — R9431 Abnormal electrocardiogram [ECG] [EKG]: Secondary | ICD-10-CM | POA: Insufficient documentation

## 2021-11-16 LAB — CBC
HCT: 35.7 % — ABNORMAL LOW (ref 36.0–46.0)
Hemoglobin: 11.6 g/dL — ABNORMAL LOW (ref 12.0–15.0)
MCH: 31.1 pg (ref 26.0–34.0)
MCHC: 32.5 g/dL (ref 30.0–36.0)
MCV: 95.7 fL (ref 80.0–100.0)
Platelets: 243 10*3/uL (ref 150–400)
RBC: 3.73 MIL/uL — ABNORMAL LOW (ref 3.87–5.11)
RDW: 12.9 % (ref 11.5–15.5)
WBC: 7 10*3/uL (ref 4.0–10.5)
nRBC: 0 % (ref 0.0–0.2)

## 2021-11-16 LAB — BASIC METABOLIC PANEL
Anion gap: 12 (ref 5–15)
BUN: 16 mg/dL (ref 8–23)
CO2: 21 mmol/L — ABNORMAL LOW (ref 22–32)
Calcium: 10 mg/dL (ref 8.9–10.3)
Chloride: 108 mmol/L (ref 98–111)
Creatinine, Ser: 1.29 mg/dL — ABNORMAL HIGH (ref 0.44–1.00)
GFR, Estimated: 45 mL/min — ABNORMAL LOW (ref >60)
Glucose, Bld: 111 mg/dL — ABNORMAL HIGH (ref 70–99)
Potassium: 3.7 mmol/L (ref 3.5–5.1)
Sodium: 141 mmol/L (ref 135–145)

## 2021-11-16 LAB — TROPONIN I (HIGH SENSITIVITY)
Troponin I (High Sensitivity): 27 ng/L — ABNORMAL HIGH (ref <18)
Troponin I (High Sensitivity): 23 ng/L — ABNORMAL HIGH (ref <18)

## 2021-11-16 MED ORDER — ASPIRIN 325 MG PO TABS
325.0000 mg | ORAL_TABLET | Freq: Once | ORAL | Status: AC
  Administered 2021-11-16: 325 mg via ORAL
  Filled 2021-11-16: qty 1

## 2021-11-16 NOTE — Telephone Encounter (Signed)
I was called regarding this patient presenting with chest pain and palpitations.  Symptoms are currently resolved.  No arrhythmia seen on EKG or telemetry, per ED physician Dr. Benson Norway.  Troponin mildly elevated but flat at 27, 23 with no delta rise.  Patient currently asymptomatic   Review of serial EKG on 11/16/2021 and compared with previous EKGs.  Sinus rhythm with LVH and secondary position abnormalities, most prominently in inferolateral leads as well as lateral nonspecific T wave version.  These EKGs are unchanged compared to her prior baseline EKGs.  I do not see any acute ischemia changes.  Given current absence of symptoms, absence of arrhythmia, no acute changes on EKG suggestive of ischemia, and high sensitive troponin that is flat and without any delta, I think discharge with close outpatient follow-up is reasonable.  Discussed with ED physician Dr. Almyra Free.    Will arrange f/u w/patient's primary cardiologist Dr. Desiree Lucy, MD Pager: [phone] Office: [phone]

## 2021-11-16 NOTE — ED Provider Notes (Signed)
[facility]GENC[facility] DEPT Provider Note   CSN: [id number] Arrival date & time: 11/16/21  2993     History  Chief Complaint  Patient presents with   Chest Pain    [name] is a 71 y.o. female.  Patient presents ER chief complaint of palpitations.  She states that she woke up this morning and felt palpitations and a fluttering sensation in her chest.  Symptoms lasted about 30 minutes and have since resolved.  She denies any pain.  Denies any fevers or cough or vomiting or diarrhea.  She did have an episode of lightheadedness 2 days ago.      Home Medications Prior to Admission medications   Medication Sig Start Date End Date Taking? Authorizing Provider  acetaminophen (TYLENOL) 500 MG tablet Take 1,000 mg by mouth every 6 (six) hours as needed for mild pain or headache.    [provider]  amLODipine (NORVASC) 10 MG tablet Take 10 mg by mouth daily.    [provider]  apixaban (ELIQUIS) 5 MG TABS tablet Take 1 tablet (5 mg total) by mouth 2 (two) times daily. 02/13/21   Tolia, Sunit, DO  aspirin EC 81 MG tablet Take 1 tablet (81 mg total) by mouth daily. Swallow whole. 07/27/21   Tolia, Sunit, DO  atorvastatin (LIPITOR) 80 MG tablet Take 1 tablet by mouth once daily 08/21/21   Tolia, Sunit, DO  escitalopram (LEXAPRO) 20 MG tablet Take 20 mg by mouth daily.    [provider]  ezetimibe (ZETIA) 10 MG tablet Take 1 tablet by mouth once daily 08/24/21   Tolia, Sunit, DO  irbesartan (AVAPRO) 300 MG tablet Take 300 mg by mouth daily.    [provider]  levothyroxine (SYNTHROID, LEVOTHROID) 100 MCG tablet Take 100 mcg by mouth at bedtime.     [provider]  metFORMIN (GLUCOPHAGE-XR) 500 MG 24 hr tablet Take 1,000 mg by mouth daily. 08/09/20   [provider]  metoprolol succinate (TOPROL XL) 50 MG 24 hr tablet Take 1 tablet (50 mg total) by mouth in the morning. Hold if systolic blood pressure (top blood pressure  number) less than 100 mmHg or heart rate less than 60 bpm (pulse). 06/12/21 09/10/21  Tolia, Sunit, DO  nitroGLYCERIN (NITROSTAT) 0.4 MG SL tablet Place 1 tablet (0.4 mg total) under the tongue every 5 (five) minutes as needed for chest pain. If you require more than two tablets five minutes apart go to the nearest ER via EMS. 06/26/20 07/27/21  Terri Skains, Sunit, DO  pantoprazole (PROTONIX) 40 MG tablet Take 1 tablet by mouth once daily 08/24/21   Tolia, Sunit, DO  VASCEPA 1 g capsule Take 2 capsules by mouth twice daily 11/05/21   Tolia, Sunit, DO      Allergies    Benazepril, Ciprofloxacin, and Naproxen sodium    Review of Systems   Review of Systems  Constitutional:  Negative for fever.  HENT:  Negative for ear pain.   Eyes:  Negative for pain.  Respiratory:  Negative for cough.   Cardiovascular:  Positive for chest pain.  Gastrointestinal:  Negative for abdominal pain.  Genitourinary:  Negative for flank pain.  Musculoskeletal:  Negative for back pain.  Skin:  Negative for rash.  Neurological:  Negative for headaches.   Physical Exam Updated Vital Signs BP 139/77    Pulse 68    Temp 98.6 F (37 C) (Oral)    Resp 13    Ht 5\' 7"  (1.702 m)  Wt 81.6 kg    SpO2 98%    BMI 28.19 kg/m  Physical Exam Constitutional:      General: She is not in acute distress.    Appearance: Normal appearance.  HENT:     Head: Normocephalic.     Nose: Nose normal.  Eyes:     Extraocular Movements: Extraocular movements intact.  Cardiovascular:     Rate and Rhythm: Normal rate.  Pulmonary:     Effort: Pulmonary effort is normal.  Musculoskeletal:        General: Normal range of motion.     Cervical back: Normal range of motion.  Neurological:     General: No focal deficit present.     Mental Status: She is alert and oriented to person, place, and time. Mental status is at baseline.    ED Results / Procedures / Treatments   Labs (all labs ordered are listed, but only abnormal results are  displayed) Labs Reviewed  BASIC METABOLIC PANEL - Abnormal; Notable for the following components:      Result Value   CO2 21 (*)    Glucose, Bld 111 (*)    Creatinine, Ser 1.29 (*)    GFR, Estimated 45 (*)    All other components within normal limits  CBC - Abnormal; Notable for the following components:   RBC 3.73 (*)    Hemoglobin 11.6 (*)    HCT 35.7 (*)    All other components within normal limits  TROPONIN I (HIGH SENSITIVITY) - Abnormal; Notable for the following components:   Troponin I (High Sensitivity) 27 (*)    All other components within normal limits  TROPONIN I (HIGH SENSITIVITY) - Abnormal; Notable for the following components:   Troponin I (High Sensitivity) 23 (*)    All other components within normal limits    EKG EKG Interpretation  Date/Time:  Monday November 16 2021 07:46:35 EST Ventricular Rate:  78 PR Interval:  133 QRS Duration: 90 QT Interval:  398 QTC Calculation: 454 R Axis:   54 Text Interpretation: Sinus rhythm Abnormal R-wave progression, early transition Probable LVH with secondary repol abnrm Confirmed by Thamas Jaegers ([id number]) on 11/16/2021 8:14:30 AM  Radiology DG Chest 2 View  Result Date: 11/16/2021 CLINICAL DATA:  Chest pain. EXAM: CHEST - 2 VIEW COMPARISON:  CT cardiac, 03/07/2020. FINDINGS: Cardiomediastinal silhouette is within normal limits. Coronary stent. Lungs are well inflated. No focal consolidation or mass. No pleural effusion or pneumothorax. No acute displaced fracture. IMPRESSION: No acute cardiopulmonary process. Electronically Signed   By: Michaelle Birks M.D.   On: 11/16/2021 08:09    Procedures Procedures    Medications Ordered in ED Medications  aspirin tablet 325 mg (325 mg Oral Given 11/16/21 1101)    ED Course/ Medical Decision Making/ A&P                           Medical Decision Making Amount and/or Complexity of Data Reviewed External Data Reviewed: notes.    Details: Chart review shows primary care office  visit on 09/20/2021 for general checkup. Labs: ordered. Radiology: ordered. ECG/medicine tests: ordered and independent interpretation performed. Discussion of management or test interpretation with external provider(s): discussed with on-call cardiologist from [facility]  Risk OTC drugs. Decision regarding hospitalization. Risk Details: Initial troponin is elevated at 27 repeat troponin is flat.  Patient remains asymptomatic without any more chest discomfort.  Case discussed with cardiology recommends outpatient care.  EKG appears  similar to prior EKGs with questionable ST elevation in lead III appears isolated and similar to prior EKGs.  Patient advised immediate return if she has recurrent chest discomfort, to the ER immediately, otherwise follow-up with cardiology within the next 3 to 4 days.  Advised avoidance of strenuous activity.            Final Clinical Impression(s) / ED Diagnoses Final diagnoses:  Chest discomfort    Rx / DC Orders ED Discharge Orders     None         Luna Fuse, MD 11/16/21 1131

## 2021-11-16 NOTE — ED Triage Notes (Signed)
Pt arrives to ED chest pain/palpitations. Pt reports she woke up this morning and felt as if her heart was racing. She did report that yesterday while at church she experienced an episode of lightheadedness and dizziness. Pt reports she has lost 70 pounds this year.

## 2021-11-16 NOTE — Telephone Encounter (Signed)
Thanks for the update.  ST

## 2021-11-16 NOTE — Discharge Instructions (Addendum)
Follow-up with your cardiology group within the next 2 to 3 days.  However if you get recurrent chest discomfort return immediately back to the ER.  Return immediately back to the ER if:  Your symptoms worsen within the next 12-24 hours. You develop new symptoms such as new fevers, persistent vomiting, new pain, shortness of breath, or new weakness or numbness, or if you have any other concerns.

## 2021-11-17 ENCOUNTER — Other Ambulatory Visit: Payer: Self-pay | Admitting: Cardiology

## 2021-11-18 DIAGNOSIS — R5383 Other fatigue: Secondary | ICD-10-CM | POA: Diagnosis not present

## 2021-11-18 DIAGNOSIS — I4892 Unspecified atrial flutter: Secondary | ICD-10-CM | POA: Diagnosis not present

## 2021-11-18 DIAGNOSIS — E1165 Type 2 diabetes mellitus with hyperglycemia: Secondary | ICD-10-CM | POA: Diagnosis not present

## 2021-11-18 DIAGNOSIS — Z7901 Long term (current) use of anticoagulants: Secondary | ICD-10-CM | POA: Diagnosis not present

## 2021-11-18 DIAGNOSIS — I2510 Atherosclerotic heart disease of native coronary artery without angina pectoris: Secondary | ICD-10-CM | POA: Diagnosis not present

## 2021-11-18 DIAGNOSIS — E039 Hypothyroidism, unspecified: Secondary | ICD-10-CM | POA: Diagnosis not present

## 2021-11-20 ENCOUNTER — Ambulatory Visit: Payer: Medicare HMO | Admitting: Cardiology

## 2021-11-20 ENCOUNTER — Encounter: Payer: Self-pay | Admitting: Cardiology

## 2021-11-20 ENCOUNTER — Other Ambulatory Visit: Payer: Self-pay

## 2021-11-20 VITALS — BP 126/61 | HR 85 | Temp 97.3°F | Resp 16 | Ht 67.0 in | Wt 191.0 lb

## 2021-11-20 DIAGNOSIS — R002 Palpitations: Secondary | ICD-10-CM

## 2021-11-20 DIAGNOSIS — I2510 Atherosclerotic heart disease of native coronary artery without angina pectoris: Secondary | ICD-10-CM | POA: Diagnosis not present

## 2021-11-20 DIAGNOSIS — E119 Type 2 diabetes mellitus without complications: Secondary | ICD-10-CM

## 2021-11-20 DIAGNOSIS — E781 Pure hyperglyceridemia: Secondary | ICD-10-CM

## 2021-11-20 DIAGNOSIS — Z955 Presence of coronary angioplasty implant and graft: Secondary | ICD-10-CM

## 2021-11-20 DIAGNOSIS — E039 Hypothyroidism, unspecified: Secondary | ICD-10-CM | POA: Diagnosis not present

## 2021-11-20 DIAGNOSIS — R072 Precordial pain: Secondary | ICD-10-CM | POA: Diagnosis not present

## 2021-11-20 DIAGNOSIS — I4892 Unspecified atrial flutter: Secondary | ICD-10-CM | POA: Diagnosis not present

## 2021-11-20 DIAGNOSIS — Z7901 Long term (current) use of anticoagulants: Secondary | ICD-10-CM

## 2021-11-20 DIAGNOSIS — I10 Essential (primary) hypertension: Secondary | ICD-10-CM | POA: Diagnosis not present

## 2021-11-20 DIAGNOSIS — E782 Mixed hyperlipidemia: Secondary | ICD-10-CM | POA: Diagnosis not present

## 2021-11-20 NOTE — Progress Notes (Signed)
[name] Date of Birth: 24-Apr-1951 MRN: [id number] Primary Care Provider:Prevost, Gaylyn Lambert, FNP Primary Cardiologist: Tessa Lerner, DO, [facility]  (established care 02/04/2020).   Date: 11/20/21 Last Office Visit: 07/27/2021  Chief Complaint  Patient presents with   Hospitalization Follow-up   Chest Pain    HPI  [name] is a 71 y.o. female who presents to the office with a chief complaint of " 26-month evaluation for CAD and atrial flutter." Patient's past medical history and cardiac risk factors include: Coronary artery disease status post angioplasty and stent, hypertension, hyperlipidemia, hypothyroidism, GERD, postmenopausal female, advanced age, non-insulin-dependent diabetes mellitus type 2, Paroxysmal atrial flutter during stress test.  Patient was referred to the office back in April 2021 for evaluation of an abnormal EKG and multiple cardiovascular risk factors.  After cardiovascular work-up patient was noted to have a low risk study with possible reversible ischemia in the distal LAD distribution due to continued symptoms she underwent coronary CTA which noted hemodynamically significant stenosis in the LAD and she subsequently underwent left heart catheterization with PCI to the LAD.  In the interim she was also diagnosed with paroxysmal atrial flutter and was started on oral anticoagulation for thromboembolic prophylaxis.  She does not endorse any evidence of bleeding and she remains in normal sinus rhythm.  She comes in for a sooner office visit after recently being seen at the local emergency room department for palpitations.  She went to the ED for symptoms of palpitation, no significant change in EKG, no significant arrhythmia on telemetry Per ED provider, troponins mildly elevated but essentially flat.  And she later was asymptomatic and therefore was sent home with close cardiology follow-up.  Since discharge from the ED patient has not had any reoccurrence of  palpitations.  She denies angina pectoris or heart failure symptoms.  Due to lifestyle changes and pharmacological therapy she has lost approximately 70 pounds in the recent past.  She is feeling tired, fatigued, lightheaded and dizzy at times.  She is requesting medication changes if possible.  At the last office visit she was started on Vascepa and was asked to repeat labs to reevaluate therapy and check LFTs.  Labs from Care Everywhere independently reviewed as part of today's encounter.  FUNCTIONAL STATUS: Walks 20-30 minutes three days a week.   ALLERGIES: Allergies  Allergen Reactions   Benazepril Cough   Ciprofloxacin Itching   Naproxen Sodium Other (See Comments)    Stomach burns    MEDICATION LIST PRIOR TO VISIT: Current Outpatient Medications on File Prior to Visit  Medication Sig Dispense Refill   acetaminophen (TYLENOL) 500 MG tablet Take 1,000 mg by mouth every 6 (six) hours as needed for mild pain or headache.     apixaban (ELIQUIS) 5 MG TABS tablet Take 1 tablet (5 mg total) by mouth 2 (two) times daily. 180 tablet 1   aspirin EC 81 MG tablet Take 1 tablet (81 mg total) by mouth daily. Swallow whole. 30 tablet 11   atorvastatin (LIPITOR) 80 MG tablet Take 1 tablet by mouth once daily 90 tablet 0   escitalopram (LEXAPRO) 20 MG tablet Take 20 mg by mouth daily.     ezetimibe (ZETIA) 10 MG tablet Take 1 tablet by mouth once daily 90 tablet 0   irbesartan (AVAPRO) 300 MG tablet Take 300 mg by mouth daily.     levothyroxine (SYNTHROID, LEVOTHROID) 100 MCG tablet Take 100 mcg by mouth at bedtime.      metFORMIN (GLUCOPHAGE-XR) 500  MG 24 hr tablet Take 1,000 mg by mouth daily.     metoprolol succinate (TOPROL XL) 50 MG 24 hr tablet Take 1 tablet (50 mg total) by mouth in the morning. Hold if systolic Anderson pressure (top Anderson pressure number) less than 100 mmHg or heart rate less than 60 bpm (pulse). 90 tablet 1   nitroGLYCERIN (NITROSTAT) 0.4 MG SL tablet Place 1 tablet (0.4 mg  total) under the tongue every 5 (five) minutes as needed for chest pain. If you require more than two tablets five minutes apart go to the nearest ER via EMS. 30 tablet 0   pantoprazole (PROTONIX) 40 MG tablet Take 1 tablet by mouth once daily 90 tablet 0   VASCEPA 1 g capsule Take 2 capsules by mouth twice daily 120 capsule 0   No current facility-administered medications on file prior to visit.    PAST MEDICAL HISTORY: Past Medical History:  Diagnosis Date   Arthritis    Coronary artery disease    Depression    Diabetes mellitus without complication (HCC)    GERD (gastroesophageal reflux disease)    Hypercholesteremia    Hypertension    Hypothyroidism    Pre-diabetes 08/19/2019   Sleep apnea    mild-modreate-tried cpap    PAST SURGICAL HISTORY: Past Surgical History:  Procedure Laterality Date   CARPAL TUNNEL RELEASE     right   COLONOSCOPY     CORONARY ANGIOPLASTY WITH STENT PLACEMENT     CORONARY STENT INTERVENTION N/A 03/18/2020   Procedure: CORONARY STENT INTERVENTION;  Surgeon: Nigel Mormon, MD;  Location: [place] [facility];  Service: Cardiovascular;  Laterality: N/A;   DILATION AND CURETTAGE OF UTERUS     INTRAVASCULAR ULTRASOUND/IVUS N/A 03/18/2020   Procedure: Intravascular Ultrasound/IVUS;  Surgeon: Nigel Mormon, MD;  Location: [place] [facility];  Service: Cardiovascular;  Laterality: N/A;   LEFT HEART CATH AND CORONARY ANGIOGRAPHY N/A 03/18/2020   Procedure: LEFT HEART CATH AND CORONARY ANGIOGRAPHY;  Surgeon: Nigel Mormon, MD;  Location: [place] [facility];  Service: Cardiovascular;  Laterality: N/A;   ORIF ANKLE FRACTURE  04/13/2012   Procedure: OPEN REDUCTION INTERNAL FIXATION (ORIF) ANKLE FRACTURE;  Surgeon: Wylene Simmer, MD;  Location: [place];  Service: Orthopedics;  Laterality: Left;   TONSILLECTOMY      FAMILY HISTORY: The patient family history includes Breast cancer in her sister; Colon cancer in her father; Dementia  in her mother; Heart attack in her father; Hypertension in her sister, sister, sister, and sister.   SOCIAL HISTORY:  The patient  reports that she has never smoked. She has never used smokeless tobacco. She reports current alcohol use. She reports that she does not use drugs.  REVIEW OF SYSTEMS: Review of Systems  Constitutional: Positive for malaise/fatigue and weight loss.  Cardiovascular:  Positive for palpitations. Negative for chest pain, dyspnea on exertion, leg swelling, orthopnea, paroxysmal nocturnal dyspnea and syncope.  Neurological:  Positive for dizziness and light-headedness.   PHYSICAL EXAM: Vitals with BMI 11/20/2021 11/16/2021 11/16/2021  Height $Remov'5\' 7"'XnLHEI$  - -  Weight 191 lbs - -  BMI 83.41 - -  Systolic 962 229 798  Diastolic 61 62 77  Pulse 85 65 68    CONSTITUTIONAL: Well-developed and well-nourished. No acute distress.  SKIN: Skin is warm and dry. No rash noted. No cyanosis. No pallor. No jaundice HEAD: Normocephalic and atraumatic.  EYES: No scleral icterus MOUTH/THROAT: Moist oral membranes.  NECK: No JVD present. No thyromegaly  noted. No carotid bruits  LYMPHATIC: No visible cervical adenopathy.  CHEST Normal respiratory effort. No intercostal retractions  LUNGS: Clear to auscultation bilaterally.  No stridor. No wheezes. No rales.  CARDIOVASCULAR: Regular rate and rhythm, positive S1-S2, no murmurs rubs or gallops appreciated.   ABDOMINAL: soft, nontender, nondistended, positive bowel sounds in all 4 quadrants. No apparent ascites.  EXTREMITIES: No peripheral edema. Radial site is healing well without hematoma / bruit.   HEMATOLOGIC: No significant bruising NEUROLOGIC: Oriented to person, place, and time. Nonfocal. Normal muscle tone.  PSYCHIATRIC: Normal mood and affect. Normal behavior. Cooperative  CARDIAC DATABASE: EKG: 07/27/2021: Normal sinus rhythm, 64 bpm, normal axis, ST-T changes in the high lateral and lateral leads suggestive of possible ischemia,  without underlying injury pattern.  No significant change EKG dated 01/07/2021.  11/20/2021: Normal sinus rhythm, 80 bpm, normal axis, ST-T changes in the high lateral and lateral leads cannot rule out ischemia.  Echocardiogram: 02/22/2020: LVEF 59%, moderate concentric LVH, grade 2 diastolic dysfunction, severely dilated left atrium, mild TR.   Stress Testing: Lexiscan (Walking with mod Bruce)Tetrofosmin Stress Test  02/18/2020: Resting EKG/ECG demonstrated atypical atrial flutter with rapid ventricular response. Observed left ventricular hypertrophy with secondary ST-T changes, repolarization abnormality. Peak EKG/ECG revealed no significant ST-T change from baseline abnormality. Patient achieved 101% of MPHR with modified Bruce protocol.  During infusion the recovery ECG revealed occasional premature  There is a reversible mild defect in the distal anterior and apical regions.  Overall LV systolic function is normal with regional wall motion abnormalities in the same region. Stress LV EF: 55%.  Low risk in view of size of defect and preserved LVEF.    Coronary CTA w/ FFR.  03/07/2020:  1. Mild non-obstructive CAD of the proximal and mid-LAD and proximal LCx, CADRADS = 2. The mid-LAD stenosis may be more significant,however, there is blooming and alignment artifact. Will submit study for FFR, given the abnormal myoview results in the distal anterior and apical regions. 2. Coronary calcium score of 148. This was 82nd percentile for age and sex matched control. 3. Normal coronary origin with right dominance. 4. Dilated main pulmonary artery to 37 mm, suggesting pulmonary hypertension. 5.  Aortic atherosclerosis CT FFR ANALYSIS 1. CT FFR demonstrates flow-limiting stenosis of the proximal to mid-LAD, just upstream of the D1 branch, affecting the mid to distal LAD and a large D2 branch. LAD: Significant stenosis. Proximal FFR = 0.96, Mid FFR = 0.66, Distal FFR = 0.60  Heart  Catheterization: March 18, 2020: LM: Normal LAD: Prox 75-80% stenosis. Mild calcification.        Mid 75-80% stenosis. Mild calcification.        Prox D1 50% stenosis. LCx: Prox 20% stenosis. RCA: Prox 20% stenosis.        PTCA and stent placement 2.5 X 12 mm Resolute Onyx drug-eluting stent mid LAD     PTCA and stent placement 3.5 X 18 mm Resolute Onyx drug-eluting stent prox LAD  LABORATORY DATA: BMP Latest Ref Rng & Units 11/16/2021 07/22/2021 06/25/2020  Glucose 70 - 99 mg/dL 111(H) 136(H) 158(H)  BUN 8 - 23 mg/dL $Remove'16 15 15  'ehNEvCI$ Creatinine 0.44 - 1.00 mg/dL 1.29(H) 1.26(H) 0.98  BUN/Creat Ratio 12 - 28 - 12 15  Sodium 135 - 145 mmol/L 141 142 141  Potassium 3.5 - 5.1 mmol/L 3.7 4.2 4.5  Chloride 98 - 111 mmol/L 108 102 103  CO2 22 - 32 mmol/L 21(L) 17(L) 21  Calcium 8.9 - 10.3 mg/dL 10.0  10.5(H) 10.2   CBC Latest Ref Rng & Units 11/16/2021 06/25/2020 03/19/2020  WBC 4.0 - 10.5 K/uL 7.0 8.8 7.7  Hemoglobin 12.0 - 15.0 g/dL 11.6(L) 12.6 11.5(L)  Hematocrit 36.0 - 46.0 % 35.7(L) 37.1 37.4  Platelets 150 - 400 K/uL 243 251 199   Lipid Panel     Component Value Date/Time   CHOL 150 07/22/2021 0838   TRIG 225 (H) 07/22/2021 0838   HDL 55 07/22/2021 0838   LDLCALC 59 07/22/2021 0838   LDLDIRECT 53 07/22/2021 0839   LABVLDL 36 07/22/2021 0838   Lipid profile: 07/11/2019: Total cholesterol 203, triglycerides 208, HDL 56, LDL 113 01/07/2020: Total cholesterol 231, triglycerides 303, HDL 55, LDL 123  Hemoglobin A1c: 07/11/2019: 6.6 01/07/2020: 6.6  TSH:  01/01/2019 1.62  Serum creatinine: 01/07/2020 0.98 mg/dL  Collected 08/14/2021: Total cholesterol 132, triglycerides 147, HDL 54, LDL 49, non-HDL 78  External Labs: Collected: 11/18/2021 provided by PCP. Hemoglobin A1c 5.7. TSH 0.14 (normal value 0.27-4.2) Hemoglobin 11.6 g/dL, hematocrit 36.2%. Sodium 140, potassium 4.1, chloride 104, bicarb 28, BUN 19, creatinine 1.29. eGFR 42 mL/min per 1.73 m AST 18, ALT 20, alkaline phosphatase  83  FINAL MEDICATION LIST END OF ENCOUNTER: No orders of the defined types were placed in this encounter.     Current Outpatient Medications:    acetaminophen (TYLENOL) 500 MG tablet, Take 1,000 mg by mouth every 6 (six) hours as needed for mild pain or headache., Disp: , Rfl:    apixaban (ELIQUIS) 5 MG TABS tablet, Take 1 tablet (5 mg total) by mouth 2 (two) times daily., Disp: 180 tablet, Rfl: 1   aspirin EC 81 MG tablet, Take 1 tablet (81 mg total) by mouth daily. Swallow whole., Disp: 30 tablet, Rfl: 11   atorvastatin (LIPITOR) 80 MG tablet, Take 1 tablet by mouth once daily, Disp: 90 tablet, Rfl: 0   escitalopram (LEXAPRO) 20 MG tablet, Take 20 mg by mouth daily., Disp: , Rfl:    ezetimibe (ZETIA) 10 MG tablet, Take 1 tablet by mouth once daily, Disp: 90 tablet, Rfl: 0   irbesartan (AVAPRO) 300 MG tablet, Take 300 mg by mouth daily., Disp: , Rfl:    levothyroxine (SYNTHROID, LEVOTHROID) 100 MCG tablet, Take 100 mcg by mouth at bedtime. , Disp: , Rfl:    metFORMIN (GLUCOPHAGE-XR) 500 MG 24 hr tablet, Take 1,000 mg by mouth daily., Disp: , Rfl:    metoprolol succinate (TOPROL XL) 50 MG 24 hr tablet, Take 1 tablet (50 mg total) by mouth in the morning. Hold if systolic Anderson pressure (top Anderson pressure number) less than 100 mmHg or heart rate less than 60 bpm (pulse)., Disp: 90 tablet, Rfl: 1   nitroGLYCERIN (NITROSTAT) 0.4 MG SL tablet, Place 1 tablet (0.4 mg total) under the tongue every 5 (five) minutes as needed for chest pain. If you require more than two tablets five minutes apart go to the nearest ER via EMS., Disp: 30 tablet, Rfl: 0   pantoprazole (PROTONIX) 40 MG tablet, Take 1 tablet by mouth once daily, Disp: 90 tablet, Rfl: 0   VASCEPA 1 g capsule, Take 2 capsules by mouth twice daily, Disp: 120 capsule, Rfl: 0  IMPRESSION:    ICD-10-CM   1. Atherosclerosis of native coronary artery of native heart without angina pectoris  I25.10 EKG 12-Lead    CMP14+EGFR    Lipid Panel With  LDL/HDL Ratio    LDL cholesterol, direct    2. History of coronary angioplasty with insertion of stent  Z95.5     3. Hypertriglyceridemia  E78.1 CMP14+EGFR    Lipid Panel With LDL/HDL Ratio    LDL cholesterol, direct    4. Paroxysmal atrial flutter (HCC)  I48.92     5. Long term (current) use of anticoagulants  Z79.01     6. Hypothyroidism, unspecified type  E03.9     7. Palpitations  R00.2     8. Essential hypertension  I10     9. Type 2 diabetes mellitus without complication, without long-term current use of insulin (HCC)  E11.9     10. Mixed hyperlipidemia  E78.2        RECOMMENDATIONS: [name] is a 71 y.o. female whose past medical history and cardiac risk factors include: Coronary artery disease status post angioplasty and stent to the proximal and mid LAD, hypertension, hyperlipidemia, hypothyroidism, GERD, postmenopausal female, advanced age, non-insulin-dependent diabetes mellitus type 2, paroxysmal atrial flutter during stress test.  Atherosclerosis of native coronary artery of native heart without angina pectoris/status post angioplasty and stent Denies angina pectoris. No use of sublingual nitroglycerin tablets. EKG: Normal sinus rhythm without underlying injury pattern. Recent ER visit documentation, EKG, labs from 11/16/2021 independently reviewed as part of today's decision-making. Continue aspirin 81 mg p.o. daily. Continue statin therapy. Continue Zetia.  Hypertriglyceridemia: Given her coronary artery disease status post coronary intervention recommended better triglyceride management. Trajectory of triglyceride levels 208, 303, 225, after the initiation of Vascepa 147 (most recent). Since the patient has lost 70 pounds would like to recheck her fasting lipid profile to see what her current triglycerides are and if coming off of Vascepa for short period of time can be considered. Orders for fasting lipid profile have been placed.  However, if she is due  for other labs with PCP she can have them done with her PCP and have results forwarded to our office for reference.    Paroxysmal atrial flutter (HCC) Rate control:Toprol-XL. Rhythm control: N/A Thromboembolic prophylaxis: Eliquis CHA2DS2-VASc SCORE is 5 which correlates to 6.7% risk of stroke per year. Most recent TSH suggestive of iatrogenic hyperthyroidism.  Patient states that her PCP has reduced the dose of Synthroid.  Long term (current) use of anticoagulants Indication: Paroxysmal atrial flutter. Does not endorse any evidence of bleeding. Reemphasized the risks, benefits, and alternatives to oral anticoagulation. With new insurance patient states that Eliquis is expensive.  I have asked her to call her insurance company to see if there is another anticoagulant on their formulary.  I have also provided the patient with patient assistance form for Eliquis.  Hypothyroidism, unspecified type/palpitations TSH suggestive of iatrogenic hyperthyroidism. Per patient, Synthroid dose has been changed by PCP recently. This may be contributing to her recent symptoms of palpitations.  Continue to monitor  Essential hypertension Office Anderson pressures are within excellent control. However due to her feeling lightheaded, dizziness, tired and fatigue I suspect that she may be overmedicated now since she has lost 70 pounds of weight. Will discontinue amlodipine. Requested her to take Avapro at night and Toprol-XL in the morning Will continue to monitor  Type 2 diabetes mellitus without complication, without long-term current use of insulin (HCC) Most recent hemoglobin A1c with an excellent control. Currently on oral antiglycemic agents. Currently on ARB, statin therapy.  Mixed hyperlipidemia Continue atorvastatin and Zetia. Most recent lipid profile reviewed LDL currently at goal. Does not endorse any myalgias.  Orders Placed This Encounter  Procedures   CMP14+EGFR   Lipid Panel With  LDL/HDL Ratio  LDL cholesterol, direct   EKG 12-Lead   --Continue cardiac medications as reconciled in final medication list. --Return in about 6 months (around 05/20/2022) for Follow up, Atrial flutter, Review test results. Or sooner if needed. --Continue follow-up with your primary care physician regarding the management of your other chronic comorbid conditions.  Patient's questions and concerns were addressed to her satisfaction. She voices understanding of the instructions provided during this encounter.   This note was created using a voice recognition software as a result there may be grammatical errors inadvertently enclosed that do not reflect the nature of this encounter. Every attempt is made to correct such errors.  Was part of today's office visit reviewed recent hospitalization records, EKGs, laboratory values independently and interpreted them as noted above.  Reviewed outside labs from care everywhere reviewed independently and noted above.  Medication changes as discussed above ordered diagnostic labs.  The patient assistance forms for medications provided and samples for Eliquis given to the patient as well.    Rex Kras, [place], [facility]  Pager: [phone] Office: [phone]

## 2021-11-23 ENCOUNTER — Other Ambulatory Visit: Payer: Self-pay | Admitting: Cardiology

## 2021-11-24 ENCOUNTER — Other Ambulatory Visit: Payer: Self-pay | Admitting: Cardiology

## 2021-11-24 DIAGNOSIS — E7800 Pure hypercholesterolemia, unspecified: Secondary | ICD-10-CM | POA: Diagnosis not present

## 2021-11-24 DIAGNOSIS — I10 Essential (primary) hypertension: Secondary | ICD-10-CM | POA: Diagnosis not present

## 2021-11-24 DIAGNOSIS — I4892 Unspecified atrial flutter: Secondary | ICD-10-CM | POA: Diagnosis not present

## 2021-11-24 DIAGNOSIS — R300 Dysuria: Secondary | ICD-10-CM | POA: Diagnosis not present

## 2021-11-24 DIAGNOSIS — E039 Hypothyroidism, unspecified: Secondary | ICD-10-CM | POA: Diagnosis not present

## 2021-11-24 DIAGNOSIS — E1165 Type 2 diabetes mellitus with hyperglycemia: Secondary | ICD-10-CM | POA: Diagnosis not present

## 2021-12-14 ENCOUNTER — Other Ambulatory Visit: Payer: Self-pay | Admitting: Cardiology

## 2021-12-14 DIAGNOSIS — I2510 Atherosclerotic heart disease of native coronary artery without angina pectoris: Secondary | ICD-10-CM

## 2021-12-22 DIAGNOSIS — E7800 Pure hypercholesterolemia, unspecified: Secondary | ICD-10-CM | POA: Diagnosis not present

## 2021-12-22 DIAGNOSIS — E039 Hypothyroidism, unspecified: Secondary | ICD-10-CM | POA: Diagnosis not present

## 2021-12-31 NOTE — Progress Notes (Signed)
External Labs: ?Collected: 12/22/2021 provided by referring physician ?Total cholesterol 134, triglycerides 106, HDL 53, LDL 54, non-HDL 75 ? ?

## 2022-01-11 DIAGNOSIS — H04123 Dry eye syndrome of bilateral lacrimal glands: Secondary | ICD-10-CM | POA: Diagnosis not present

## 2022-01-11 DIAGNOSIS — H3554 Dystrophies primarily involving the retinal pigment epithelium: Secondary | ICD-10-CM | POA: Diagnosis not present

## 2022-01-11 DIAGNOSIS — H26493 Other secondary cataract, bilateral: Secondary | ICD-10-CM | POA: Diagnosis not present

## 2022-01-11 DIAGNOSIS — Z961 Presence of intraocular lens: Secondary | ICD-10-CM | POA: Diagnosis not present

## 2022-01-11 DIAGNOSIS — H524 Presbyopia: Secondary | ICD-10-CM | POA: Diagnosis not present

## 2022-01-18 ENCOUNTER — Other Ambulatory Visit: Payer: Self-pay | Admitting: Cardiology

## 2022-01-18 DIAGNOSIS — I2510 Atherosclerotic heart disease of native coronary artery without angina pectoris: Secondary | ICD-10-CM

## 2022-01-25 ENCOUNTER — Other Ambulatory Visit: Payer: Self-pay

## 2022-01-25 ENCOUNTER — Ambulatory Visit: Payer: Medicare HMO | Admitting: Cardiology

## 2022-01-25 DIAGNOSIS — I4892 Unspecified atrial flutter: Secondary | ICD-10-CM

## 2022-01-25 MED ORDER — METOPROLOL SUCCINATE ER 50 MG PO TB24: 50.0000 mg | ORAL_TABLET | Freq: Every morning | ORAL | 1 refills | Status: DC

## 2022-02-26 DIAGNOSIS — E1165 Type 2 diabetes mellitus with hyperglycemia: Secondary | ICD-10-CM | POA: Diagnosis not present

## 2022-02-26 DIAGNOSIS — I10 Essential (primary) hypertension: Secondary | ICD-10-CM | POA: Diagnosis not present

## 2022-02-26 DIAGNOSIS — Z0000 Encounter for general adult medical examination without abnormal findings: Secondary | ICD-10-CM | POA: Diagnosis not present

## 2022-02-26 DIAGNOSIS — Z955 Presence of coronary angioplasty implant and graft: Secondary | ICD-10-CM | POA: Diagnosis not present

## 2022-02-26 DIAGNOSIS — E039 Hypothyroidism, unspecified: Secondary | ICD-10-CM | POA: Diagnosis not present

## 2022-02-26 DIAGNOSIS — I2510 Atherosclerotic heart disease of native coronary artery without angina pectoris: Secondary | ICD-10-CM | POA: Diagnosis not present

## 2022-02-26 DIAGNOSIS — I4892 Unspecified atrial flutter: Secondary | ICD-10-CM | POA: Diagnosis not present

## 2022-02-26 DIAGNOSIS — E559 Vitamin D deficiency, unspecified: Secondary | ICD-10-CM | POA: Diagnosis not present

## 2022-02-26 DIAGNOSIS — R7989 Other specified abnormal findings of blood chemistry: Secondary | ICD-10-CM | POA: Diagnosis not present

## 2022-03-01 ENCOUNTER — Other Ambulatory Visit: Payer: Self-pay | Admitting: Cardiology

## 2022-03-01 DIAGNOSIS — I2510 Atherosclerotic heart disease of native coronary artery without angina pectoris: Secondary | ICD-10-CM

## 2022-03-17 ENCOUNTER — Other Ambulatory Visit: Payer: Self-pay | Admitting: Cardiology

## 2022-03-23 NOTE — Progress Notes (Signed)
External Labs: Collected: Feb 26, 2022 provided by PCP. Total cholesterol 130, triglycerides 88, HDL 62, LDL 50, non-HDL 68. TSH 0.78 Hemoglobin 11.6, hematocrit 35.6% Sodium 143, potassium 4.3, chloride 106, bicarb 27 BUN 18, creatinine 1.2,  AST 20, ALT 25, alkaline phosphatase 88

## 2022-04-12 ENCOUNTER — Other Ambulatory Visit: Payer: Self-pay | Admitting: Cardiology

## 2022-04-12 DIAGNOSIS — I2510 Atherosclerotic heart disease of native coronary artery without angina pectoris: Secondary | ICD-10-CM

## 2022-05-18 DIAGNOSIS — I10 Essential (primary) hypertension: Secondary | ICD-10-CM | POA: Diagnosis not present

## 2022-05-18 DIAGNOSIS — E7800 Pure hypercholesterolemia, unspecified: Secondary | ICD-10-CM | POA: Diagnosis not present

## 2022-05-18 DIAGNOSIS — E1165 Type 2 diabetes mellitus with hyperglycemia: Secondary | ICD-10-CM | POA: Diagnosis not present

## 2022-05-21 ENCOUNTER — Ambulatory Visit: Payer: Medicare HMO | Admitting: Cardiology

## 2022-05-21 ENCOUNTER — Encounter: Payer: Self-pay | Admitting: Cardiology

## 2022-05-21 VITALS — BP 146/69 | HR 63 | Temp 97.6°F | Resp 16 | Ht 67.0 in | Wt 183.0 lb

## 2022-05-21 DIAGNOSIS — E781 Pure hyperglyceridemia: Secondary | ICD-10-CM | POA: Diagnosis not present

## 2022-05-21 DIAGNOSIS — E782 Mixed hyperlipidemia: Secondary | ICD-10-CM | POA: Diagnosis not present

## 2022-05-21 DIAGNOSIS — Z7901 Long term (current) use of anticoagulants: Secondary | ICD-10-CM | POA: Diagnosis not present

## 2022-05-21 DIAGNOSIS — Z955 Presence of coronary angioplasty implant and graft: Secondary | ICD-10-CM

## 2022-05-21 DIAGNOSIS — I2510 Atherosclerotic heart disease of native coronary artery without angina pectoris: Secondary | ICD-10-CM

## 2022-05-21 DIAGNOSIS — I10 Essential (primary) hypertension: Secondary | ICD-10-CM | POA: Diagnosis not present

## 2022-05-21 DIAGNOSIS — E119 Type 2 diabetes mellitus without complications: Secondary | ICD-10-CM | POA: Diagnosis not present

## 2022-05-21 DIAGNOSIS — I4892 Unspecified atrial flutter: Secondary | ICD-10-CM

## 2022-05-21 NOTE — Progress Notes (Signed)
[name] Date of Birth: [birth year] MRN: [id number] Primary Care Provider:Prevost, [place], [place] Primary Cardiologist: Rex Kras, DO, [facility]  (established care 02/04/2020).   Date: 05/21/22 Last Office Visit: 11/20/2021  Chief Complaint  Patient presents with   Follow-up    6 months Paroxysmal Atrial flutter  CAD    HPI  [name] is a 71 y.o. female whose past medical history and cardiac risk factors include: Coronary artery disease status post angioplasty and stent, hypertension, hyperlipidemia, hypothyroidism, GERD, postmenopausal female, advanced age, non-insulin-dependent diabetes mellitus type 2, Paroxysmal atrial flutter during stress test.  Referred to the practice back in April 2021 for abnormal EKG and multiple cardiovascular risk factors.  She underwent ischemic work-up and was noted to have disease in the distal LAD distribution on coronary CTA which was hemodynamically stable.  She subsequently underwent angiography and had PCI to the LAD performed and small standpoint.  In the interim she was also noted to have paroxysmal atrial flutter and has been on AV nodal blocking agents as well as anticoagulation for thromboembolic prophylaxis.  She remains asymptomatic with regards to anginal or heart failure standpoint.  She is worked on lifestyle changes and has lost more than 70 pounds of weight for today's visit.  Since last office visit she has been having nonspecific symptoms of headache and ringing in the ears.  At last office visit we de-escalated antihypertensive medications as she was experiencing lightheaded and dizziness (she had lost 70 pounds so reduced her BP meds).  FUNCTIONAL STATUS: Walks 20-30 minutes three days a week.   ALLERGIES: Allergies  Allergen Reactions   Benazepril Cough   Ciprofloxacin Itching   Naproxen Sodium Other (See Comments)    Stomach burns    MEDICATION LIST PRIOR TO VISIT: Current Outpatient Medications on File Prior to Visit   Medication Sig Dispense Refill   acetaminophen (TYLENOL) 500 MG tablet Take 1,000 mg by mouth every 6 (six) hours as needed for mild pain or headache.     apixaban (ELIQUIS) 5 MG TABS tablet Take 1 tablet (5 mg total) by mouth 2 (two) times daily. 180 tablet 1   aspirin EC 81 MG tablet Take 1 tablet (81 mg total) by mouth daily. Swallow whole. 30 tablet 11   atorvastatin (LIPITOR) 80 MG tablet Take 1 tablet by mouth once daily 90 tablet 1   escitalopram (LEXAPRO) 20 MG tablet Take 20 mg by mouth daily.     ezetimibe (ZETIA) 10 MG tablet Take 1 tablet by mouth once daily 90 tablet 0   irbesartan (AVAPRO) 300 MG tablet Take 300 mg by mouth daily.     levothyroxine (SYNTHROID, LEVOTHROID) 100 MCG tablet Take 100 mcg by mouth at bedtime.      metFORMIN (GLUCOPHAGE-XR) 500 MG 24 hr tablet Take 1,000 mg by mouth daily.     metoprolol succinate (TOPROL XL) 50 MG 24 hr tablet Take 1 tablet (50 mg total) by mouth in the morning. Hold if systolic blood pressure (top blood pressure number) less than 100 mmHg or heart rate less than 60 bpm (pulse). 90 tablet 1   nitroGLYCERIN (NITROSTAT) 0.4 MG SL tablet Place 1 tablet (0.4 mg total) under the tongue every 5 (five) minutes as needed for chest pain. If you require more than two tablets five minutes apart go to the nearest ER via EMS. 30 tablet 0   pantoprazole (PROTONIX) 40 MG tablet Take 1 tablet by mouth once daily 90 tablet 0   VASCEPA  1 g capsule Take 2 capsules by mouth twice daily 120 capsule 0   No current facility-administered medications on file prior to visit.    PAST MEDICAL HISTORY: Past Medical History:  Diagnosis Date   Arthritis    Coronary artery disease    Depression    Diabetes mellitus without complication (HCC)    GERD (gastroesophageal reflux disease)    Hypercholesteremia    Hypertension    Hypothyroidism    Pre-diabetes 08/19/2019   Sleep apnea    mild-modreate-tried cpap    PAST SURGICAL HISTORY: Past Surgical History:   Procedure Laterality Date   CARPAL TUNNEL RELEASE     right   COLONOSCOPY     CORONARY ANGIOPLASTY WITH STENT PLACEMENT     CORONARY STENT INTERVENTION N/A 03/18/2020   Procedure: CORONARY STENT INTERVENTION;  Surgeon: Nigel Mormon, MD;  Location: [place] [facility];  Service: Cardiovascular;  Laterality: N/A;   DILATION AND CURETTAGE OF UTERUS     INTRAVASCULAR ULTRASOUND/IVUS N/A 03/18/2020   Procedure: Intravascular Ultrasound/IVUS;  Surgeon: Nigel Mormon, MD;  Location: [place] [facility];  Service: Cardiovascular;  Laterality: N/A;   LEFT HEART CATH AND CORONARY ANGIOGRAPHY N/A 03/18/2020   Procedure: LEFT HEART CATH AND CORONARY ANGIOGRAPHY;  Surgeon: Nigel Mormon, MD;  Location: [place] [facility];  Service: Cardiovascular;  Laterality: N/A;   ORIF ANKLE FRACTURE  04/13/2012   Procedure: OPEN REDUCTION INTERNAL FIXATION (ORIF) ANKLE FRACTURE;  Surgeon: Wylene Simmer, MD;  Location: [place];  Service: Orthopedics;  Laterality: Left;   TONSILLECTOMY      FAMILY HISTORY: The patient family history includes Breast cancer in her sister; Colon cancer in her father; Dementia in her mother; Heart attack in her father; Hypertension in her sister, sister, sister, and sister.   SOCIAL HISTORY:  The patient  reports that she has never smoked. She has never used smokeless tobacco. She reports current alcohol use. She reports that she does not use drugs.  REVIEW OF SYSTEMS: Review of Systems  Constitutional: Positive for weight loss.  Cardiovascular:  Negative for chest pain, dyspnea on exertion, leg swelling, orthopnea, palpitations, paroxysmal nocturnal dyspnea and syncope.  Neurological:  Positive for headaches. Negative for dizziness and light-headedness.    PHYSICAL EXAM:    05/21/2022   10:34 AM 11/20/2021    2:43 PM 11/16/2021   11:30 AM  Vitals with BMI  Height _0  _1    Weight 183 lbs 191 lbs   BMI 29.47 65.46   Systolic 503 546 568   Diastolic 69 61 62  Pulse 63 85 65    CONSTITUTIONAL: Well-developed and well-nourished. No acute distress.  SKIN: Skin is warm and dry. No rash noted. No cyanosis. No pallor. No jaundice HEAD: Normocephalic and atraumatic.  EYES: No scleral icterus MOUTH/THROAT: Moist oral membranes.  NECK: No JVD present. No thyromegaly noted. No carotid bruits  LYMPHATIC: No visible cervical adenopathy.  CHEST Normal respiratory effort. No intercostal retractions  LUNGS: Clear to auscultation bilaterally.  No stridor. No wheezes. No rales.  CARDIOVASCULAR: Regular rate and rhythm, positive S1-S2, no murmurs rubs or gallops appreciated.   ABDOMINAL: soft, nontender, nondistended, positive bowel sounds in all 4 quadrants. No apparent ascites.  EXTREMITIES: No peripheral edema. Radial site is healing well without hematoma / bruit.   HEMATOLOGIC: No significant bruising NEUROLOGIC: Oriented to person, place, and time. Nonfocal. Normal muscle tone.  PSYCHIATRIC: Normal mood and affect. Normal behavior. Cooperative  CARDIAC DATABASE: EKG:  05/21/2022: NSR, 67bpm, nonspecific ST-T changes, consider anterolateral & lateral  ischemia, similar to prior ECG.   Echocardiogram: 02/22/2020: LVEF 59%, moderate concentric LVH, grade 2 diastolic dysfunction, severely dilated left atrium, mild TR.   Stress Testing: Lexiscan (Walking with mod Bruce)Tetrofosmin Stress Test  02/18/2020: Resting EKG/ECG demonstrated atypical atrial flutter with rapid ventricular response. Observed left ventricular hypertrophy with secondary ST-T changes, repolarization abnormality. Peak EKG/ECG revealed no significant ST-T change from baseline abnormality. Patient achieved 101% of MPHR with modified Bruce protocol.  During infusion the recovery ECG revealed occasional premature  There is a reversible mild defect in the distal anterior and apical regions.  Overall LV systolic function is normal with regional wall motion abnormalities  in the same region. Stress LV EF: 55%.  Low risk in view of size of defect and preserved LVEF.    Coronary CTA w/ FFR.  03/07/2020:  1. Mild non-obstructive CAD of the proximal and mid-LAD and proximal LCx, CADRADS = 2. The mid-LAD stenosis may be more significant,however, there is blooming and alignment artifact. Will submit study for FFR, given the abnormal myoview results in the distal anterior and apical regions. 2. Coronary calcium score of 148. This was 82nd percentile for age and sex matched control. 3. Normal coronary origin with right dominance. 4. Dilated main pulmonary artery to 37 mm, suggesting pulmonary hypertension. 5.  Aortic atherosclerosis CT FFR ANALYSIS 1. CT FFR demonstrates flow-limiting stenosis of the proximal to mid-LAD, just upstream of the D1 branch, affecting the mid to distal LAD and a large D2 branch. LAD: Significant stenosis. Proximal FFR = 0.96, Mid FFR = 0.66, Distal FFR = 0.60  Heart Catheterization: March 18, 2020: LM: Normal LAD: Prox 75-80% stenosis. Mild calcification.        Mid 75-80% stenosis. Mild calcification.        Prox D1 50% stenosis. LCx: Prox 20% stenosis. RCA: Prox 20% stenosis.        PTCA and stent placement 2.5 X 12 mm Resolute Onyx drug-eluting stent mid LAD     PTCA and stent placement 3.5 X 18 mm Resolute Onyx drug-eluting stent prox LAD  LABORATORY DATA:    Latest Ref Rng & Units 11/16/2021    7:50 AM 07/22/2021    8:37 AM 06/25/2020    8:31 AM  BMP  Glucose 70 - 99 mg/dL 111  136  158   BUN 8 - 23 mg/dL _0 Creatinine 0.44 - 1.00 mg/dL 1.29  1.26  0.98   BUN/Creat Ratio 12 - _1 Sodium 135 - 145 mmol/L 141  142  141   Potassium 3.5 - 5.1 mmol/L 3.7  4.2  4.5   Chloride 98 - 111 mmol/L 108  102  103   CO2 22 - 32 mmol/L _2 Calcium 8.9 - 10.3 mg/dL 10.0  10.5  10.2       Latest Ref Rng & Units 11/16/2021    7:50 AM 06/25/2020    8:31 AM 03/19/2020    6:30 AM  CBC  WBC 4.0 - 10.5 K/uL 7.0  8.8   7.7   Hemoglobin 12.0 - 15.0 g/dL 11.6  12.6  11.5   Hematocrit 36.0 - 46.0 % 35.7  37.1  37.4   Platelets 150 - 400 K/uL 243  251  199    Lipid Panel     Component Value Date/Time   CHOL 150 07/22/2021 0838  TRIG 225 (H) 07/22/2021 0838   HDL 55 07/22/2021 0838   LDLCALC 59 07/22/2021 0838   LDLDIRECT 53 07/22/2021 0839   LABVLDL 36 07/22/2021 0838   Lipid profile: 07/11/2019: Total cholesterol 203, triglycerides 208, HDL 56, LDL 113 01/07/2020: Total cholesterol 231, triglycerides 303, HDL 55, LDL 123  Hemoglobin A1c: 07/11/2019: 6.6 01/07/2020: 6.6  TSH:  01/01/2019 1.62  Serum creatinine: 01/07/2020 0.98 mg/dL  Collected 08/14/2021: Total cholesterol 132, triglycerides 147, HDL 54, LDL 49, non-HDL 78  External Labs: Collected: 11/18/2021 provided by PCP. Hemoglobin A1c 5.7. TSH 0.14 (normal value 0.27-4.2) Hemoglobin 11.6 g/dL, hematocrit 36.2%. Sodium 140, potassium 4.1, chloride 104, bicarb 28, BUN 19, creatinine 1.29. eGFR 42 mL/min per 1.73 m AST 18, ALT 20, alkaline phosphatase 83  External Labs: Collected: Feb 26, 2022 provided by PCP. Total cholesterol 130, triglycerides 88, HDL 62, LDL 50, non-HDL 68. TSH 0.78 Hemoglobin 11.6, hematocrit 35.6% Sodium 143, potassium 4.3, chloride 106, bicarb 27 BUN 18, creatinine 1.2,  AST 20, ALT 25, alkaline phosphatase 88  FINAL MEDICATION LIST END OF ENCOUNTER: No orders of the defined types were placed in this encounter.    Current Outpatient Medications:    acetaminophen (TYLENOL) 500 MG tablet, Take 1,000 mg by mouth every 6 (six) hours as needed for mild pain or headache., Disp: , Rfl:    apixaban (ELIQUIS) 5 MG TABS tablet, Take 1 tablet (5 mg total) by mouth 2 (two) times daily., Disp: 180 tablet, Rfl: 1   aspirin EC 81 MG tablet, Take 1 tablet (81 mg total) by mouth daily. Swallow whole., Disp: 30 tablet, Rfl: 11   atorvastatin (LIPITOR) 80 MG tablet, Take 1 tablet by mouth once daily, Disp: 90 tablet, Rfl: 1    escitalopram (LEXAPRO) 20 MG tablet, Take 20 mg by mouth daily., Disp: , Rfl:    ezetimibe (ZETIA) 10 MG tablet, Take 1 tablet by mouth once daily, Disp: 90 tablet, Rfl: 0   irbesartan (AVAPRO) 300 MG tablet, Take 300 mg by mouth daily., Disp: , Rfl:    levothyroxine (SYNTHROID, LEVOTHROID) 100 MCG tablet, Take 100 mcg by mouth at bedtime. , Disp: , Rfl:    metFORMIN (GLUCOPHAGE-XR) 500 MG 24 hr tablet, Take 1,000 mg by mouth daily., Disp: , Rfl:    metoprolol succinate (TOPROL XL) 50 MG 24 hr tablet, Take 1 tablet (50 mg total) by mouth in the morning. Hold if systolic blood pressure (top blood pressure number) less than 100 mmHg or heart rate less than 60 bpm (pulse)., Disp: 90 tablet, Rfl: 1   nitroGLYCERIN (NITROSTAT) 0.4 MG SL tablet, Place 1 tablet (0.4 mg total) under the tongue every 5 (five) minutes as needed for chest pain. If you require more than two tablets five minutes apart go to the nearest ER via EMS., Disp: 30 tablet, Rfl: 0   pantoprazole (PROTONIX) 40 MG tablet, Take 1 tablet by mouth once daily, Disp: 90 tablet, Rfl: 0   VASCEPA 1 g capsule, Take 2 capsules by mouth twice daily, Disp: 120 capsule, Rfl: 0  IMPRESSION:    ICD-10-CM   1. Paroxysmal atrial flutter (HCC)  I48.92 EKG 12-Lead    2. Long term (current) use of anticoagulants  Z79.01     3. Atherosclerosis of native coronary artery of native heart without angina pectoris  I25.10 PCV ECHOCARDIOGRAM COMPLETE    4. History of coronary angioplasty with insertion of stent  Z95.5 PCV ECHOCARDIOGRAM COMPLETE    5. Essential hypertension  I10  6. Type 2 diabetes mellitus without complication, without long-term current use of insulin (HCC)  E11.9     7. Hypertriglyceridemia  E78.1     8. Mixed hyperlipidemia  E78.2        RECOMMENDATIONS: [name] is a 71 y.o. female whose past medical history and cardiac risk factors include: Coronary artery disease status post angioplasty and stent to the proximal and mid  LAD, hypertension, hyperlipidemia, hypothyroidism, GERD, postmenopausal female, advanced age, non-insulin-dependent diabetes mellitus type 2, paroxysmal atrial flutter during stress test.  Paroxysmal atrial flutter (HCC) Rate control: Toprol-XL. Rhythm control: N/A. Thromboembolic prophylaxis: Eliquis. CHA2DS2-VASc SCORE is 5 which correlates to 6.7% risk of stroke per year (age, gender, DM, HTN, aortic atherosclerosis).  Patient does not endorse evidence of bleeding. Reemphasized the risks, benefits, and alternatives to anticoagulation.  Atherosclerosis of native coronary artery of native heart without angina pectoris /status post angioplasty and stent Denies angina pectoris.   No use of sublingual nitroglycerin tablets since last office visit.  EKG illustrates sinus rhythm with ST-T changes similar to prior ECGs Prior ischemic work-up reviewed as part of today's office visit. Continue aspirin and statin therapy/Zetia  Essential hypertension Office blood pressures within acceptable range.  Home blood pressures are better controlled. We will continue current medical therapy.   Type 2 diabetes mellitus without complication, without long-term current use of insulin ([place]) Reemphasized importance of glycemic control. Currently managed by primary care provider.  Hypertriglyceridemia Triglycerides are very well controlled. Continue pharmacological therapy.  Mixed hyperlipidemia LDL currently at goal. Continue pharmacological therapy.  Given her symptoms of headaches and ringing in her ears I have asked her to reach out to PCP for further evaluation and management.  Orders Placed This Encounter  Procedures   EKG 12-Lead   PCV ECHOCARDIOGRAM COMPLETE   --Continue cardiac medications as reconciled in final medication list. --Return in about 1 year (around 05/22/2023) for Follow up, Atrial flutter, CAD. Or sooner if needed. --Continue follow-up with your primary care physician  regarding the management of your other chronic comorbid conditions.  Patient's questions and concerns were addressed to her satisfaction. She voices understanding of the instructions provided during this encounter.   This note was created using a voice recognition software as a result there may be grammatical errors inadvertently enclosed that do not reflect the nature of this encounter. Every attempt is made to correct such errors.   Rex Kras, [place], [facility]  Pager: [phone] Office: [phone]

## 2022-05-22 ENCOUNTER — Other Ambulatory Visit: Payer: Self-pay | Admitting: Cardiology

## 2022-05-22 DIAGNOSIS — I2510 Atherosclerotic heart disease of native coronary artery without angina pectoris: Secondary | ICD-10-CM

## 2022-05-25 DIAGNOSIS — E039 Hypothyroidism, unspecified: Secondary | ICD-10-CM | POA: Diagnosis not present

## 2022-05-25 DIAGNOSIS — Z7901 Long term (current) use of anticoagulants: Secondary | ICD-10-CM | POA: Diagnosis not present

## 2022-05-25 DIAGNOSIS — H9313 Tinnitus, bilateral: Secondary | ICD-10-CM | POA: Diagnosis not present

## 2022-05-25 DIAGNOSIS — E7800 Pure hypercholesterolemia, unspecified: Secondary | ICD-10-CM | POA: Diagnosis not present

## 2022-05-25 DIAGNOSIS — I10 Essential (primary) hypertension: Secondary | ICD-10-CM | POA: Diagnosis not present

## 2022-05-25 DIAGNOSIS — E1169 Type 2 diabetes mellitus with other specified complication: Secondary | ICD-10-CM | POA: Diagnosis not present

## 2022-05-25 DIAGNOSIS — Z955 Presence of coronary angioplasty implant and graft: Secondary | ICD-10-CM | POA: Diagnosis not present

## 2022-06-19 ENCOUNTER — Other Ambulatory Visit: Payer: Self-pay | Admitting: Cardiology

## 2022-06-22 ENCOUNTER — Other Ambulatory Visit: Payer: Self-pay | Admitting: Cardiology

## 2022-06-28 DIAGNOSIS — H9313 Tinnitus, bilateral: Secondary | ICD-10-CM | POA: Diagnosis not present

## 2022-06-28 DIAGNOSIS — Z820 Family history of epilepsy and other diseases of the nervous system: Secondary | ICD-10-CM | POA: Diagnosis not present

## 2022-06-28 DIAGNOSIS — R413 Other amnesia: Secondary | ICD-10-CM | POA: Diagnosis not present

## 2022-06-28 DIAGNOSIS — E1165 Type 2 diabetes mellitus with hyperglycemia: Secondary | ICD-10-CM | POA: Diagnosis not present

## 2022-06-28 DIAGNOSIS — H9193 Unspecified hearing loss, bilateral: Secondary | ICD-10-CM | POA: Diagnosis not present

## 2022-06-28 DIAGNOSIS — E039 Hypothyroidism, unspecified: Secondary | ICD-10-CM | POA: Diagnosis not present

## 2022-06-28 DIAGNOSIS — I2510 Atherosclerotic heart disease of native coronary artery without angina pectoris: Secondary | ICD-10-CM | POA: Diagnosis not present

## 2022-06-28 DIAGNOSIS — E559 Vitamin D deficiency, unspecified: Secondary | ICD-10-CM | POA: Diagnosis not present

## 2022-06-29 ENCOUNTER — Other Ambulatory Visit: Payer: Self-pay | Admitting: Cardiology

## 2022-07-01 DIAGNOSIS — E538 Deficiency of other specified B group vitamins: Secondary | ICD-10-CM | POA: Diagnosis not present

## 2022-07-01 DIAGNOSIS — R413 Other amnesia: Secondary | ICD-10-CM | POA: Diagnosis not present

## 2022-07-01 DIAGNOSIS — H9313 Tinnitus, bilateral: Secondary | ICD-10-CM | POA: Diagnosis not present

## 2022-07-01 DIAGNOSIS — E559 Vitamin D deficiency, unspecified: Secondary | ICD-10-CM | POA: Diagnosis not present

## 2022-07-01 DIAGNOSIS — H9193 Unspecified hearing loss, bilateral: Secondary | ICD-10-CM | POA: Diagnosis not present

## 2022-07-02 ENCOUNTER — Other Ambulatory Visit (HOSPITAL_COMMUNITY): Payer: Self-pay | Admitting: Registered Nurse

## 2022-07-02 ENCOUNTER — Other Ambulatory Visit: Payer: Self-pay | Admitting: Internal Medicine

## 2022-07-02 DIAGNOSIS — I2510 Atherosclerotic heart disease of native coronary artery without angina pectoris: Secondary | ICD-10-CM

## 2022-07-02 DIAGNOSIS — R413 Other amnesia: Secondary | ICD-10-CM

## 2022-07-04 ENCOUNTER — Other Ambulatory Visit: Payer: Self-pay | Admitting: Cardiology

## 2022-07-05 ENCOUNTER — Ambulatory Visit (INDEPENDENT_AMBULATORY_CARE_PROVIDER_SITE_OTHER): Payer: Medicare HMO

## 2022-07-05 DIAGNOSIS — R413 Other amnesia: Secondary | ICD-10-CM | POA: Diagnosis not present

## 2022-07-05 DIAGNOSIS — I2510 Atherosclerotic heart disease of native coronary artery without angina pectoris: Secondary | ICD-10-CM

## 2022-07-06 ENCOUNTER — Other Ambulatory Visit: Payer: Self-pay | Admitting: Cardiology

## 2022-07-06 DIAGNOSIS — I2510 Atherosclerotic heart disease of native coronary artery without angina pectoris: Secondary | ICD-10-CM

## 2022-07-16 ENCOUNTER — Encounter (HOSPITAL_BASED_OUTPATIENT_CLINIC_OR_DEPARTMENT_OTHER): Payer: Self-pay

## 2022-07-16 ENCOUNTER — Emergency Department (HOSPITAL_BASED_OUTPATIENT_CLINIC_OR_DEPARTMENT_OTHER)
Admission: EM | Admit: 2022-07-16 | Discharge: 2022-07-16 | Disposition: A | Discharge status: Home / Self Care | Payer: Medicare HMO | Attending: Emergency Medicine | Admitting: Emergency Medicine

## 2022-07-16 ENCOUNTER — Other Ambulatory Visit: Payer: Self-pay

## 2022-07-16 ENCOUNTER — Other Ambulatory Visit (HOSPITAL_BASED_OUTPATIENT_CLINIC_OR_DEPARTMENT_OTHER): Payer: Self-pay

## 2022-07-16 DIAGNOSIS — Z7982 Long term (current) use of aspirin: Secondary | ICD-10-CM | POA: Insufficient documentation

## 2022-07-16 DIAGNOSIS — W268XXA Contact with other sharp object(s), not elsewhere classified, initial encounter: Secondary | ICD-10-CM | POA: Diagnosis not present

## 2022-07-16 DIAGNOSIS — Z79899 Other long term (current) drug therapy: Secondary | ICD-10-CM | POA: Insufficient documentation

## 2022-07-16 DIAGNOSIS — S6992XA Unspecified injury of left wrist, hand and finger(s), initial encounter: Secondary | ICD-10-CM | POA: Diagnosis not present

## 2022-07-16 DIAGNOSIS — S61211A Laceration without foreign body of left index finger without damage to nail, initial encounter: Secondary | ICD-10-CM | POA: Diagnosis not present

## 2022-07-16 DIAGNOSIS — Z7901 Long term (current) use of anticoagulants: Secondary | ICD-10-CM | POA: Insufficient documentation

## 2022-07-16 DIAGNOSIS — Z23 Encounter for immunization: Secondary | ICD-10-CM | POA: Diagnosis not present

## 2022-07-16 MED ORDER — TETANUS-DIPHTH-ACELL PERTUSSIS 5-2.5-18.5 LF-MCG/0.5 IM SUSY
0.5000 mL | PREFILLED_SYRINGE | Freq: Once | INTRAMUSCULAR | Status: AC
  Administered 2022-07-16: 0.5 mL via INTRAMUSCULAR
  Filled 2022-07-16: qty 0.5

## 2022-07-16 NOTE — ED Triage Notes (Signed)
Pt presents POV from home  Pt reports she cut her Left pointer finger last night approx [id number] while slicing chicken off the bone. Pt reports she is on Eliquis, bleeding was on and off last night, went to UC today and they sent her here d/t Eliquis. Bleeding controlled at this time, bandage in place

## 2022-07-16 NOTE — ED Provider Notes (Signed)
[facility] Provider Note   CSN: [id number] Arrival date & time: 07/16/22  1135     History  Chief Complaint  Patient presents with   Laceration    [name] is a 71 y.o. female on Eliquis presents to the ED for evlauation of her left index finger laceration. Last night, the patient reports that she was slicing chicken and cut her left index finger. She reports that she tried to clean it. She reports that it was bleeding last night. She went to urgent care this AM and they sent her over to the ED for evaluation because she is on a blood thinner. She does not remember when her last tetanus was.    Laceration      Home Medications Prior to Admission medications   Medication Sig Start Date End Date Taking? Authorizing Provider  acetaminophen (TYLENOL) 500 MG tablet Take 1,000 mg by mouth every 6 (six) hours as needed for mild pain or headache.    [provider]  apixaban (ELIQUIS) 5 MG TABS tablet Take 1 tablet (5 mg total) by mouth 2 (two) times daily. 02/13/21   Tolia, Sunit, DO  aspirin EC 81 MG tablet Take 1 tablet (81 mg total) by mouth daily. Swallow whole. 07/27/21   Tolia, Sunit, DO  atorvastatin (LIPITOR) 80 MG tablet Take 1 tablet by mouth once daily 07/05/22   Tolia, Sunit, DO  escitalopram (LEXAPRO) 20 MG tablet Take 20 mg by mouth daily.    [provider]  ezetimibe (ZETIA) 10 MG tablet Take 1 tablet by mouth once daily 06/29/22   Tolia, Sunit, DO  irbesartan (AVAPRO) 300 MG tablet Take 300 mg by mouth daily.    [provider]  levothyroxine (SYNTHROID, LEVOTHROID) 100 MCG tablet Take 100 mcg by mouth at bedtime.     [provider]  metFORMIN (GLUCOPHAGE-XR) 500 MG 24 hr tablet Take 1,000 mg by mouth daily. 08/09/20   [provider]  metoprolol succinate (TOPROL XL) 50 MG 24 hr tablet Take 1 tablet (50 mg total) by mouth in the morning. Hold if systolic blood pressure (top blood pressure  number) less than 100 mmHg or heart rate less than 60 bpm (pulse). 01/25/22 05/21/22  Tolia, Sunit, DO  nitroGLYCERIN (NITROSTAT) 0.4 MG SL tablet Place 1 tablet (0.4 mg total) under the tongue every 5 (five) minutes as needed for chest pain. If you require more than two tablets five minutes apart go to the nearest ER via EMS. 06/26/20 05/21/22  Terri Skains, Sunit, DO  pantoprazole (PROTONIX) 40 MG tablet Take 1 tablet by mouth once daily 06/22/22   Tolia, Sunit, DO  VASCEPA 1 g capsule Take 2 capsules by mouth twice daily 07/06/22   Tolia, Sunit, DO      Allergies    Benazepril, Ciprofloxacin, and Naproxen sodium    Review of Systems   Review of Systems  Physical Exam Updated Vital Signs BP (!) 149/82 (BP Location: Right Arm)   Pulse 64   Temp 97.6 F (36.4 C)   Resp 16   SpO2 100%  Physical Exam Vitals and nursing note reviewed.  Constitutional:      Appearance: Normal appearance.  Eyes:     General: No scleral icterus. Pulmonary:     Effort: Pulmonary effort is normal. No respiratory distress.  Musculoskeletal:     Comments: Laceration noted to the the radial aspect of the left index finger. Involves the cuticle bed, but no nail involvement. Brisk cap  refill. Radial pulse intact. Full ROM of the ringer.   Skin:    General: Skin is dry.     Findings: No rash.  Neurological:     General: No focal deficit present.     Mental Status: She is alert. Mental status is at baseline.  Psychiatric:        Mood and Affect: Mood normal.        ED Results / Procedures / Treatments   Labs (all labs ordered are listed, but only abnormal results are displayed) Labs Reviewed - No data to display  EKG None  Radiology No results found.  Procedures .Marland KitchenLaceration Repair  Date/Time: 07/16/2022 3:32 PM  Performed by: Sherrell Puller, PA-C Authorized by: Sherrell Puller, PA-C   Consent:    Consent obtained:  Verbal   Consent given by:  Patient   Risks discussed:  Infection, need for additional  repair, pain, poor cosmetic result and poor wound healing   Alternatives discussed:  No treatment and delayed treatment Universal protocol:    Procedure explained and questions answered to patient or proxy's satisfaction: yes     Patient identity confirmed:  Verbally with patient Anesthesia:    Anesthesia method:  None Exploration:    Wound exploration: wound explored through full range of motion and entire depth of wound visualized     Contaminated: no   Treatment:    Area cleansed with:  Soap and water   Amount of cleaning:  Standard   Irrigation solution:  Sterile saline   Irrigation volume:  1L   Irrigation method:  Tap Skin repair:    Repair method:  Tissue adhesive Approximation:    Approximation:  Close Repair type:    Repair type:  Simple Post-procedure details:    Dressing:  Non-adherent dressing and splint for protection   Procedure completion:  Tolerated well, no immediate complications    Medications Ordered in ED Medications  Tdap (BOOSTRIX) injection 0.5 mL (has no administration in time range)    ED Course/ Medical Decision Making/ A&P                           Medical Decision Making Risk Prescription drug management.    71 year old female presents the emergency department for evaluation of finger laceration since last night.  Differential diagnosis includes was not limited to laceration versus infection.  Vital signs show slightly elevated blood pressure otherwise unremarkable.  Physical exam as noted above.  Please see images.  I do not think any images are needed as the patient was working with a knife and the wound is very superficial in full depth the wound is visualized and thoroughly cleansed out.  Patient presented with gauze bandage overlapping the fingers and present for 3 hours without any bleeding to the finger.  Bleeding controlled at this time.  Had patient soak her finger in warm soapy water.  Wound is clean.  Attending assessed at bedside  and recommended Dermabond with finger splint. No antibiotics recommended at this time.   Please see procedure note for additional information. Tetanus updated.   Laceration care given to the patient.  Advised follow up with PCP is one week for re-evaluation. We discussed return precautions and red flag symptoms with the patient.  Patient verbalized understanding and agrees to the plan.  Patient is stable being discharged home in good condition.  I discussed this case with my attending physician who cosigned this note including patient's presenting symptoms,  physical exam, and planned diagnostics and interventions. Attending physician stated agreement with plan or made changes to plan which were implemented.   Attending physician assessed patient at bedside.  Final Clinical Impression(s) / ED Diagnoses Final diagnoses:  Laceration of left index finger without damage to nail, foreign body presence unspecified, initial encounter    Rx / DC Orders ED Discharge Orders     None         Sherrell Puller, PA-C 07/16/22 1534    Gareth Morgan, MD 07/16/22 2347

## 2022-07-16 NOTE — Discharge Instructions (Signed)
You were seen in the emergency room and for evaluation of the laceration to your left index finger.  The bleeding was well controlled.  We were able to fix this with Dermabond.  I included information on nonsutured laceration care to this discharge paperwork.  Remember, no doing the dishes, soaking your hand in dirty water, pools, lakes, baths, Jacuzzis, etc.  You can take Tylenol as needed for pain.  If you have any concern, new or worsening symptoms, please return to the nearest emergency department for reevaluation.  Please follow-up with your primary care doctor in the next 7 days for wound reevaluation.  Please make sure to not pick at the glue will come off on its own time around 5 to 10 days.  Contact a health care provider if: You received a tetanus shot and you have swelling, severe pain, redness, or bleeding at the injection site. Your pain is not controlled with medicine. You have any of these signs of infection: More redness, swelling, or pain around your wound. Fluid or blood coming from your wound. Warmth coming from your wound. Pus or a bad smell coming from your wound. A fever. You notice something coming out of the wound, such as wood or glass. You notice a change in the color of your skin near your wound. You develop a new rash. You need to change the dressing often. You develop numbness around your wound. Get help right away if: Your pain suddenly increases and is severe. You develop severe swelling around the wound. The wound is on your hand or foot, and you cannot properly move a finger or toe. The wound is on your hand or foot, and you notice that your fingers or toes look pale or bluish. You have a red streak going away from your wound. You develop painful lumps near the wound or on skin anywhere else on your body.

## 2022-07-20 ENCOUNTER — Telehealth: Payer: Self-pay
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
## 2022-08-13 ENCOUNTER — Encounter: Payer: Self-pay | Admitting: Physician Assistant

## 2022-08-18 ENCOUNTER — Ambulatory Visit: Payer: Medicare HMO | Admitting: Physician Assistant

## 2022-08-18 ENCOUNTER — Encounter: Payer: Self-pay | Admitting: Physician Assistant

## 2022-08-18 DIAGNOSIS — I10 Essential (primary) hypertension: Secondary | ICD-10-CM | POA: Diagnosis not present

## 2022-08-18 DIAGNOSIS — Z029 Encounter for administrative examinations, unspecified: Secondary | ICD-10-CM

## 2022-08-18 DIAGNOSIS — J069 Acute upper respiratory infection, unspecified: Secondary | ICD-10-CM | POA: Diagnosis not present

## 2022-08-18 DIAGNOSIS — E538 Deficiency of other specified B group vitamins: Secondary | ICD-10-CM | POA: Diagnosis not present

## 2022-08-21 ENCOUNTER — Other Ambulatory Visit: Payer: Self-pay

## 2022-08-21 DIAGNOSIS — I2510 Atherosclerotic heart disease of native coronary artery without angina pectoris: Secondary | ICD-10-CM

## 2022-08-21 MED ORDER — VASCEPA 1 G PO CAPS: 2.0000 g | ORAL_CAPSULE | Freq: Two times a day (BID) | ORAL | 3 refills | Status: DC

## 2022-08-31 DIAGNOSIS — R413 Other amnesia: Secondary | ICD-10-CM | POA: Diagnosis not present

## 2022-08-31 DIAGNOSIS — I10 Essential (primary) hypertension: Secondary | ICD-10-CM | POA: Diagnosis not present

## 2022-08-31 DIAGNOSIS — E1165 Type 2 diabetes mellitus with hyperglycemia: Secondary | ICD-10-CM | POA: Diagnosis not present

## 2022-08-31 DIAGNOSIS — I2510 Atherosclerotic heart disease of native coronary artery without angina pectoris: Secondary | ICD-10-CM | POA: Diagnosis not present

## 2022-08-31 DIAGNOSIS — E538 Deficiency of other specified B group vitamins: Secondary | ICD-10-CM | POA: Diagnosis not present

## 2022-08-31 DIAGNOSIS — I4892 Unspecified atrial flutter: Secondary | ICD-10-CM | POA: Diagnosis not present

## 2022-08-31 DIAGNOSIS — E559 Vitamin D deficiency, unspecified: Secondary | ICD-10-CM | POA: Diagnosis not present

## 2022-08-31 DIAGNOSIS — R7303 Prediabetes: Secondary | ICD-10-CM | POA: Diagnosis not present

## 2022-08-31 DIAGNOSIS — E7800 Pure hypercholesterolemia, unspecified: Secondary | ICD-10-CM | POA: Diagnosis not present

## 2022-08-31 DIAGNOSIS — E039 Hypothyroidism, unspecified: Secondary | ICD-10-CM | POA: Diagnosis not present

## 2022-09-13 ENCOUNTER — Emergency Department (HOSPITAL_COMMUNITY)
Admission: EM | Admit: 2022-09-13 | Discharge: 2022-09-14 | Disposition: A | Discharge status: Home / Self Care | Payer: Medicare HMO | Attending: Emergency Medicine | Admitting: Emergency Medicine

## 2022-09-13 ENCOUNTER — Emergency Department (HOSPITAL_COMMUNITY): Payer: Medicare HMO

## 2022-09-13 ENCOUNTER — Other Ambulatory Visit: Payer: Self-pay

## 2022-09-13 DIAGNOSIS — M25562 Pain in left knee: Secondary | ICD-10-CM | POA: Insufficient documentation

## 2022-09-13 DIAGNOSIS — M25561 Pain in right knee: Secondary | ICD-10-CM | POA: Diagnosis not present

## 2022-09-13 DIAGNOSIS — R0689 Other abnormalities of breathing: Secondary | ICD-10-CM | POA: Diagnosis not present

## 2022-09-13 DIAGNOSIS — S06330A Contusion and laceration of cerebrum, unspecified, without loss of consciousness, initial encounter: Secondary | ICD-10-CM

## 2022-09-13 DIAGNOSIS — M79641 Pain in right hand: Secondary | ICD-10-CM | POA: Diagnosis not present

## 2022-09-13 DIAGNOSIS — Z7984 Long term (current) use of oral hypoglycemic drugs: Secondary | ICD-10-CM | POA: Insufficient documentation

## 2022-09-13 DIAGNOSIS — S066X0A Traumatic subarachnoid hemorrhage without loss of consciousness, initial encounter: Secondary | ICD-10-CM

## 2022-09-13 DIAGNOSIS — Z7901 Long term (current) use of anticoagulants: Secondary | ICD-10-CM | POA: Diagnosis not present

## 2022-09-13 DIAGNOSIS — H9313 Tinnitus, bilateral: Secondary | ICD-10-CM | POA: Diagnosis not present

## 2022-09-13 DIAGNOSIS — I2510 Atherosclerotic heart disease of native coronary artery without angina pectoris: Secondary | ICD-10-CM | POA: Insufficient documentation

## 2022-09-13 DIAGNOSIS — S60211A Contusion of right wrist, initial encounter: Secondary | ICD-10-CM | POA: Diagnosis not present

## 2022-09-13 DIAGNOSIS — I10 Essential (primary) hypertension: Secondary | ICD-10-CM | POA: Diagnosis not present

## 2022-09-13 DIAGNOSIS — H903 Sensorineural hearing loss, bilateral: Secondary | ICD-10-CM | POA: Diagnosis not present

## 2022-09-13 DIAGNOSIS — M7989 Other specified soft tissue disorders: Secondary | ICD-10-CM | POA: Diagnosis not present

## 2022-09-13 DIAGNOSIS — S0990XA Unspecified injury of head, initial encounter: Secondary | ICD-10-CM | POA: Diagnosis not present

## 2022-09-13 DIAGNOSIS — S0101XA Laceration without foreign body of scalp, initial encounter: Secondary | ICD-10-CM | POA: Insufficient documentation

## 2022-09-13 DIAGNOSIS — Z743 Need for continuous supervision: Secondary | ICD-10-CM | POA: Diagnosis not present

## 2022-09-13 DIAGNOSIS — S06310A Contusion and laceration of right cerebrum without loss of consciousness, initial encounter: Secondary | ICD-10-CM | POA: Diagnosis not present

## 2022-09-13 DIAGNOSIS — S3991XA Unspecified injury of abdomen, initial encounter: Secondary | ICD-10-CM | POA: Diagnosis not present

## 2022-09-13 DIAGNOSIS — Z041 Encounter for examination and observation following transport accident: Secondary | ICD-10-CM | POA: Diagnosis not present

## 2022-09-13 DIAGNOSIS — S8002XA Contusion of left knee, initial encounter: Secondary | ICD-10-CM

## 2022-09-13 DIAGNOSIS — R519 Headache, unspecified: Secondary | ICD-10-CM | POA: Diagnosis not present

## 2022-09-13 DIAGNOSIS — E119 Type 2 diabetes mellitus without complications: Secondary | ICD-10-CM | POA: Insufficient documentation

## 2022-09-13 DIAGNOSIS — I517 Cardiomegaly: Secondary | ICD-10-CM | POA: Diagnosis not present

## 2022-09-13 DIAGNOSIS — E039 Hypothyroidism, unspecified: Secondary | ICD-10-CM | POA: Insufficient documentation

## 2022-09-13 DIAGNOSIS — Y92410 Unspecified street and highway as the place of occurrence of the external cause: Secondary | ICD-10-CM | POA: Insufficient documentation

## 2022-09-13 DIAGNOSIS — M25531 Pain in right wrist: Secondary | ICD-10-CM | POA: Diagnosis not present

## 2022-09-13 DIAGNOSIS — M542 Cervicalgia: Secondary | ICD-10-CM | POA: Diagnosis not present

## 2022-09-13 DIAGNOSIS — Z79899 Other long term (current) drug therapy: Secondary | ICD-10-CM | POA: Diagnosis not present

## 2022-09-13 DIAGNOSIS — D259 Leiomyoma of uterus, unspecified: Secondary | ICD-10-CM | POA: Diagnosis not present

## 2022-09-13 DIAGNOSIS — T68XXXA Hypothermia, initial encounter: Secondary | ICD-10-CM | POA: Diagnosis not present

## 2022-09-13 DIAGNOSIS — S3993XA Unspecified injury of pelvis, initial encounter: Secondary | ICD-10-CM | POA: Diagnosis not present

## 2022-09-13 DIAGNOSIS — S8001XA Contusion of right knee, initial encounter: Secondary | ICD-10-CM

## 2022-09-13 DIAGNOSIS — S60221A Contusion of right hand, initial encounter: Secondary | ICD-10-CM

## 2022-09-13 DIAGNOSIS — R58 Hemorrhage, not elsewhere classified: Secondary | ICD-10-CM | POA: Diagnosis not present

## 2022-09-13 DIAGNOSIS — S299XXA Unspecified injury of thorax, initial encounter: Secondary | ICD-10-CM | POA: Diagnosis not present

## 2022-09-13 LAB — COMPREHENSIVE METABOLIC PANEL
ALT: 27 U/L (ref 0–44)
AST: 21 U/L (ref 15–41)
Albumin: 3.2 g/dL — ABNORMAL LOW (ref 3.5–5.0)
Alkaline Phosphatase: 50 U/L (ref 38–126)
Anion gap: 6 (ref 5–15)
BUN: 15 mg/dL (ref 8–23)
CO2: 17 mmol/L — ABNORMAL LOW (ref 22–32)
Calcium: 7.8 mg/dL — ABNORMAL LOW (ref 8.9–10.3)
Chloride: 116 mmol/L — ABNORMAL HIGH (ref 98–111)
Creatinine, Ser: 1.03 mg/dL — ABNORMAL HIGH (ref 0.44–1.00)
GFR, Estimated: 59 mL/min — ABNORMAL LOW (ref >60)
Glucose, Bld: 86 mg/dL (ref 70–99)
Potassium: 3.5 mmol/L (ref 3.5–5.1)
Sodium: 139 mmol/L (ref 135–145)
Total Bilirubin: 0.5 mg/dL (ref 0.3–1.2)
Total Protein: 5.6 g/dL — ABNORMAL LOW (ref 6.5–8.1)

## 2022-09-13 LAB — URINALYSIS, ROUTINE W REFLEX MICROSCOPIC
Bilirubin Urine: NEGATIVE
Glucose, UA: NEGATIVE mg/dL
Ketones, ur: NEGATIVE mg/dL
Nitrite: POSITIVE — AB
Protein, ur: NEGATIVE mg/dL
Specific Gravity, Urine: <1.005 — ABNORMAL LOW (ref 1.005–1.030)
pH: 7 (ref 5.0–8.0)

## 2022-09-13 LAB — URINALYSIS, MICROSCOPIC (REFLEX)

## 2022-09-13 LAB — CBC
HCT: 30.4 % — ABNORMAL LOW (ref 36.0–46.0)
Hemoglobin: 9.8 g/dL — ABNORMAL LOW (ref 12.0–15.0)
MCH: 33.1 pg (ref 26.0–34.0)
MCHC: 32.2 g/dL (ref 30.0–36.0)
MCV: 102.7 fL — ABNORMAL HIGH (ref 80.0–100.0)
Platelets: 135 10*3/uL — ABNORMAL LOW (ref 150–400)
RBC: 2.96 MIL/uL — ABNORMAL LOW (ref 3.87–5.11)
RDW: 13.6 % (ref 11.5–15.5)
WBC: 5.9 10*3/uL (ref 4.0–10.5)
nRBC: 0 % (ref 0.0–0.2)

## 2022-09-13 LAB — PROTIME-INR
INR: 1.3 — ABNORMAL HIGH (ref 0.8–1.2)
Prothrombin Time: 15.9 seconds — ABNORMAL HIGH (ref 11.4–15.2)

## 2022-09-13 LAB — I-STAT CHEM 8, ED
BUN: 16 mg/dL (ref 8–23)
Calcium, Ion: 1.02 mmol/L — ABNORMAL LOW (ref 1.15–1.40)
Chloride: 113 mmol/L — ABNORMAL HIGH (ref 98–111)
Creatinine, Ser: 1 mg/dL (ref 0.44–1.00)
Glucose, Bld: 85 mg/dL (ref 70–99)
HCT: 28 % — ABNORMAL LOW (ref 36.0–46.0)
Hemoglobin: 9.5 g/dL — ABNORMAL LOW (ref 12.0–15.0)
Potassium: 3.6 mmol/L (ref 3.5–5.1)
Sodium: 143 mmol/L (ref 135–145)
TCO2: 17 mmol/L — ABNORMAL LOW (ref 22–32)

## 2022-09-13 LAB — LACTIC ACID, PLASMA: Lactic Acid, Venous: 0.8 mmol/L (ref 0.5–1.9)

## 2022-09-13 LAB — SAMPLE TO BLOOD BANK

## 2022-09-13 MED ORDER — IOHEXOL 350 MG/ML SOLN
75.0000 mL | Freq: Once | INTRAVENOUS | Status: AC | PRN
  Administered 2022-09-13: 75 mL via INTRAVENOUS

## 2022-09-13 MED ORDER — LIDOCAINE HCL 2 % IJ SOLN
INTRAMUSCULAR | Status: AC
  Filled 2022-09-13: qty 20

## 2022-09-13 MED ORDER — ACETAMINOPHEN 500 MG PO TABS: 1000.0000 mg | ORAL_TABLET | Freq: Once | ORAL | Status: DC

## 2022-09-13 MED ORDER — HYDROCODONE-ACETAMINOPHEN 5-325 MG PO TABS
1.0000 | ORAL_TABLET | Freq: Once | ORAL | Status: AC
  Administered 2022-09-13: 1 via ORAL
  Filled 2022-09-13: qty 1

## 2022-09-13 MED ORDER — LIDOCAINE-EPINEPHRINE-TETRACAINE (LET) TOPICAL GEL
3.0000 mL | Freq: Once | TOPICAL | Status: AC
  Administered 2022-09-13: 3 mL via TOPICAL
  Filled 2022-09-13: qty 3
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
## 2022-09-13 NOTE — ED Notes (Signed)
Pt's wound cleaned. Pt assisted to [facility] and back to bed. Pt resting comfortably.
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
## 2022-09-14 ENCOUNTER — Emergency Department (HOSPITAL_COMMUNITY): Payer: Medicare HMO

## 2022-09-14 DIAGNOSIS — S06310A Contusion and laceration of right cerebrum without loss of consciousness, initial encounter: Secondary | ICD-10-CM | POA: Diagnosis not present

## 2022-09-14 DIAGNOSIS — S066X0A Traumatic subarachnoid hemorrhage without loss of consciousness, initial encounter: Secondary | ICD-10-CM | POA: Diagnosis not present

## 2022-09-14 DIAGNOSIS — S0101XA Laceration without foreign body of scalp, initial encounter: Secondary | ICD-10-CM | POA: Diagnosis not present

## 2022-09-14 NOTE — Discharge Instructions (Addendum)
Your CT scans showed a small amount of bleeding in the brain.  This was stable over 6 hours.  However, if you have any new or concerning symptoms, please return immediately to the emergency department.  Please stop taking apixaban (Eliquis) for the next 3 days.  You may apply ice to any sore area.  Ice should be applied for 30 minutes at a time, 4 times a day.  You may take acetaminophen as needed for pain.

## 2022-09-14 NOTE — ED Notes (Signed)
Patient's sister at bedside.

## 2022-09-16 ENCOUNTER — Other Ambulatory Visit: Payer: Self-pay

## 2022-09-16 DIAGNOSIS — I4892 Unspecified atrial flutter: Secondary | ICD-10-CM

## 2022-09-16 MED ORDER — METOPROLOL SUCCINATE ER 50 MG PO TB24: 50.0000 mg | ORAL_TABLET | Freq: Every morning | ORAL | 2 refills | Status: DC

## 2022-09-21 ENCOUNTER — Telehealth: Payer: Self-pay

## 2022-09-21 NOTE — Telephone Encounter (Signed)
     Patient  visit on 11/28  at [facility]   Have you been able to follow up with your primary care physician? Yes   The patient was or was not able to obtain any needed medicine or equipment. Yes   Are there diet recommendations that you are having difficulty following? Na   Patient expresses understanding of discharge instructions and education provided has no other needs at this time.  Yes     [facility], [facility], Care Management  [phone] [address], [place], [place] [address] Phone: [phone] Email: [email or web address]

## 2022-09-23 DIAGNOSIS — S0083XS Contusion of other part of head, sequela: Secondary | ICD-10-CM | POA: Diagnosis not present

## 2022-09-23 DIAGNOSIS — T07XXXA Unspecified multiple injuries, initial encounter: Secondary | ICD-10-CM | POA: Diagnosis not present

## 2022-09-23 DIAGNOSIS — Z4802 Encounter for removal of sutures: Secondary | ICD-10-CM | POA: Diagnosis not present

## 2022-09-23 DIAGNOSIS — Z8679 Personal history of other diseases of the circulatory system: Secondary | ICD-10-CM | POA: Diagnosis not present

## 2022-09-23 DIAGNOSIS — Z09 Encounter for follow-up examination after completed treatment for conditions other than malignant neoplasm: Secondary | ICD-10-CM | POA: Diagnosis not present

## 2022-09-23 DIAGNOSIS — Z7901 Long term (current) use of anticoagulants: Secondary | ICD-10-CM | POA: Diagnosis not present

## 2022-09-23 DIAGNOSIS — S0101XD Laceration without foreign body of scalp, subsequent encounter: Secondary | ICD-10-CM | POA: Diagnosis not present

## 2022-09-23 DIAGNOSIS — H9193 Unspecified hearing loss, bilateral: Secondary | ICD-10-CM | POA: Diagnosis not present

## 2022-09-30 DIAGNOSIS — E1169 Type 2 diabetes mellitus with other specified complication: Secondary | ICD-10-CM | POA: Diagnosis not present

## 2022-09-30 DIAGNOSIS — E7800 Pure hypercholesterolemia, unspecified: Secondary | ICD-10-CM | POA: Diagnosis not present

## 2022-09-30 DIAGNOSIS — E039 Hypothyroidism, unspecified: Secondary | ICD-10-CM | POA: Diagnosis not present

## 2022-09-30 DIAGNOSIS — I2510 Atherosclerotic heart disease of native coronary artery without angina pectoris: Secondary | ICD-10-CM | POA: Diagnosis not present

## 2022-09-30 DIAGNOSIS — E559 Vitamin D deficiency, unspecified: Secondary | ICD-10-CM | POA: Diagnosis not present

## 2022-09-30 DIAGNOSIS — I4892 Unspecified atrial flutter: Secondary | ICD-10-CM | POA: Diagnosis not present

## 2022-09-30 DIAGNOSIS — Z7901 Long term (current) use of anticoagulants: Secondary | ICD-10-CM | POA: Diagnosis not present

## 2022-09-30 DIAGNOSIS — E538 Deficiency of other specified B group vitamins: Secondary | ICD-10-CM | POA: Diagnosis not present

## 2022-09-30 DIAGNOSIS — I10 Essential (primary) hypertension: Secondary | ICD-10-CM | POA: Diagnosis not present

## 2022-10-15 ENCOUNTER — Other Ambulatory Visit: Payer: Self-pay | Admitting: Cardiology

## 2022-10-16 ENCOUNTER — Other Ambulatory Visit: Payer: Self-pay | Admitting: Cardiology

## 2022-11-03 DIAGNOSIS — E559 Vitamin D deficiency, unspecified: Secondary | ICD-10-CM | POA: Diagnosis not present

## 2022-11-03 DIAGNOSIS — Z7901 Long term (current) use of anticoagulants: Secondary | ICD-10-CM | POA: Diagnosis not present

## 2022-11-03 DIAGNOSIS — E538 Deficiency of other specified B group vitamins: Secondary | ICD-10-CM | POA: Diagnosis not present

## 2022-12-07 DIAGNOSIS — R413 Other amnesia: Secondary | ICD-10-CM | POA: Diagnosis not present

## 2022-12-14 ENCOUNTER — Other Ambulatory Visit: Payer: Self-pay

## 2022-12-14 DIAGNOSIS — I2510 Atherosclerotic heart disease of native coronary artery without angina pectoris: Secondary | ICD-10-CM

## 2022-12-14 MED ORDER — VASCEPA 1 G PO CAPS: 2.0000 g | ORAL_CAPSULE | Freq: Two times a day (BID) | ORAL | 0 refills | Status: DC

## 2022-12-14 MED ORDER — VASCEPA 1 G PO CAPS: 2.0000 g | ORAL_CAPSULE | Freq: Two times a day (BID) | ORAL | 1 refills | Status: DC

## 2022-12-23 DIAGNOSIS — I4892 Unspecified atrial flutter: Secondary | ICD-10-CM | POA: Diagnosis not present

## 2023-01-31 ENCOUNTER — Other Ambulatory Visit: Payer: Self-pay | Admitting: Cardiology

## 2023-02-01 ENCOUNTER — Other Ambulatory Visit: Payer: Self-pay | Admitting: Cardiology

## 2023-03-02 DIAGNOSIS — E039 Hypothyroidism, unspecified: Secondary | ICD-10-CM | POA: Diagnosis not present

## 2023-03-02 DIAGNOSIS — E538 Deficiency of other specified B group vitamins: Secondary | ICD-10-CM | POA: Diagnosis not present

## 2023-03-02 DIAGNOSIS — E559 Vitamin D deficiency, unspecified: Secondary | ICD-10-CM | POA: Diagnosis not present

## 2023-03-02 DIAGNOSIS — E7800 Pure hypercholesterolemia, unspecified: Secondary | ICD-10-CM | POA: Diagnosis not present

## 2023-03-02 DIAGNOSIS — R7303 Prediabetes: Secondary | ICD-10-CM | POA: Diagnosis not present

## 2023-03-02 DIAGNOSIS — I10 Essential (primary) hypertension: Secondary | ICD-10-CM | POA: Diagnosis not present

## 2023-03-07 DIAGNOSIS — F0390 Unspecified dementia without behavioral disturbance: Secondary | ICD-10-CM | POA: Diagnosis not present

## 2023-03-08 ENCOUNTER — Telehealth: Payer: Self-pay | Admitting: Hematology

## 2023-03-08 NOTE — Telephone Encounter (Signed)
scheduled per referral, pt has been called and confirmed date and time. Pt is aware of location and to arrive early for check in   

## 2023-03-09 DIAGNOSIS — I2510 Atherosclerotic heart disease of native coronary artery without angina pectoris: Secondary | ICD-10-CM | POA: Diagnosis not present

## 2023-03-09 DIAGNOSIS — I10 Essential (primary) hypertension: Secondary | ICD-10-CM | POA: Diagnosis not present

## 2023-03-09 DIAGNOSIS — R7303 Prediabetes: Secondary | ICD-10-CM | POA: Diagnosis not present

## 2023-03-09 DIAGNOSIS — H9193 Unspecified hearing loss, bilateral: Secondary | ICD-10-CM | POA: Diagnosis not present

## 2023-03-09 DIAGNOSIS — Z0000 Encounter for general adult medical examination without abnormal findings: Secondary | ICD-10-CM | POA: Diagnosis not present

## 2023-03-09 DIAGNOSIS — D696 Thrombocytopenia, unspecified: Secondary | ICD-10-CM | POA: Diagnosis not present

## 2023-03-09 DIAGNOSIS — R4189 Other symptoms and signs involving cognitive functions and awareness: Secondary | ICD-10-CM | POA: Diagnosis not present

## 2023-03-09 DIAGNOSIS — I4892 Unspecified atrial flutter: Secondary | ICD-10-CM | POA: Diagnosis not present

## 2023-03-09 DIAGNOSIS — E039 Hypothyroidism, unspecified: Secondary | ICD-10-CM | POA: Diagnosis not present

## 2023-03-09 DIAGNOSIS — Z955 Presence of coronary angioplasty implant and graft: Secondary | ICD-10-CM | POA: Diagnosis not present

## 2023-03-09 DIAGNOSIS — E538 Deficiency of other specified B group vitamins: Secondary | ICD-10-CM | POA: Diagnosis not present

## 2023-03-11 ENCOUNTER — Inpatient Hospital Stay: Payer: Medicare HMO | Attending: Hematology | Admitting: Hematology

## 2023-03-11 ENCOUNTER — Inpatient Hospital Stay: Payer: Medicare HMO

## 2023-03-11 VITALS — BP 151/92 | HR 62 | Temp 98.1°F | Resp 17 | Ht 68.0 in | Wt 202.7 lb

## 2023-03-11 DIAGNOSIS — E119 Type 2 diabetes mellitus without complications: Secondary | ICD-10-CM

## 2023-03-11 DIAGNOSIS — I4892 Unspecified atrial flutter: Secondary | ICD-10-CM

## 2023-03-11 DIAGNOSIS — Z79899 Other long term (current) drug therapy: Secondary | ICD-10-CM

## 2023-03-11 DIAGNOSIS — E7800 Pure hypercholesterolemia, unspecified: Secondary | ICD-10-CM

## 2023-03-11 DIAGNOSIS — D696 Thrombocytopenia, unspecified: Secondary | ICD-10-CM

## 2023-03-11 DIAGNOSIS — I10 Essential (primary) hypertension: Secondary | ICD-10-CM

## 2023-03-11 DIAGNOSIS — Z7901 Long term (current) use of anticoagulants: Secondary | ICD-10-CM | POA: Diagnosis not present

## 2023-03-11 DIAGNOSIS — Z7982 Long term (current) use of aspirin: Secondary | ICD-10-CM | POA: Diagnosis not present

## 2023-03-11 DIAGNOSIS — E538 Deficiency of other specified B group vitamins: Secondary | ICD-10-CM

## 2023-03-11 DIAGNOSIS — I2510 Atherosclerotic heart disease of native coronary artery without angina pectoris: Secondary | ICD-10-CM

## 2023-03-11 DIAGNOSIS — E559 Vitamin D deficiency, unspecified: Secondary | ICD-10-CM

## 2023-03-11 DIAGNOSIS — E785 Hyperlipidemia, unspecified: Secondary | ICD-10-CM

## 2023-03-11 DIAGNOSIS — D649 Anemia, unspecified: Secondary | ICD-10-CM

## 2023-03-11 DIAGNOSIS — Z7984 Long term (current) use of oral hypoglycemic drugs: Secondary | ICD-10-CM | POA: Diagnosis not present

## 2023-03-11 LAB — CBC WITH DIFFERENTIAL (CANCER CENTER ONLY)
Abs Immature Granulocytes: 0 10*3/uL (ref 0.00–0.07)
Basophils Absolute: 0 10*3/uL (ref 0.0–0.1)
Basophils Relative: 0 %
Eosinophils Absolute: 0.1 10*3/uL (ref 0.0–0.5)
Eosinophils Relative: 2 %
HCT: 32.6 % — ABNORMAL LOW (ref 36.0–46.0)
Hemoglobin: 10.5 g/dL — ABNORMAL LOW (ref 12.0–15.0)
Immature Granulocytes: 0 %
Lymphocytes Relative: 36 %
Lymphs Abs: 2.4 10*3/uL (ref 0.7–4.0)
MCH: 30.8 pg (ref 26.0–34.0)
MCHC: 32.2 g/dL (ref 30.0–36.0)
MCV: 95.6 fL (ref 80.0–100.0)
Monocytes Absolute: 0.4 10*3/uL (ref 0.1–1.0)
Monocytes Relative: 6 %
Neutro Abs: 3.6 10*3/uL (ref 1.7–7.7)
Neutrophils Relative %: 56 %
Platelet Count: 209 10*3/uL (ref 150–400)
RBC: 3.41 MIL/uL — ABNORMAL LOW (ref 3.87–5.11)
RDW: 13.2 % (ref 11.5–15.5)
WBC Count: 6.5 10*3/uL (ref 4.0–10.5)
nRBC: 0 % (ref 0.0–0.2)

## 2023-03-11 LAB — IMMATURE PLATELET FRACTION: Immature Platelet Fraction: 6.2 % (ref 1.2–8.6)

## 2023-03-11 LAB — IRON AND IRON BINDING CAPACITY (CC-WL,HP ONLY)
Iron: 44 ug/dL (ref 28–170)
Saturation Ratios: 10 % — ABNORMAL LOW (ref 10.4–31.8)
TIBC: 444 ug/dL (ref 250–450)
UIBC: 400 ug/dL (ref 148–442)

## 2023-03-11 LAB — CMP (CANCER CENTER ONLY)
ALT: 22 U/L (ref 0–44)
AST: 23 U/L (ref 15–41)
Albumin: 4.1 g/dL (ref 3.5–5.0)
Alkaline Phosphatase: 79 U/L (ref 38–126)
Anion gap: 5 (ref 5–15)
BUN: 19 mg/dL (ref 8–23)
CO2: 28 mmol/L (ref 22–32)
Calcium: 9.5 mg/dL (ref 8.9–10.3)
Chloride: 106 mmol/L (ref 98–111)
Creatinine: 0.98 mg/dL (ref 0.44–1.00)
GFR, Estimated: >60 mL/min (ref >60)
Glucose, Bld: 88 mg/dL (ref 70–99)
Potassium: 4.4 mmol/L (ref 3.5–5.1)
Sodium: 139 mmol/L (ref 135–145)
Total Bilirubin: 0.5 mg/dL (ref 0.3–1.2)
Total Protein: 6.8 g/dL (ref 6.5–8.1)

## 2023-03-11 LAB — FERRITIN: Ferritin: 7 ng/mL — ABNORMAL LOW (ref 11–307)

## 2023-03-11 LAB — TSH: TSH: 2.284 u[IU]/mL (ref 0.350–4.500)

## 2023-03-11 LAB — VITAMIN B12: Vitamin B-12: 856 pg/mL (ref 180–914)
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
## 2023-03-13 LAB — FOLATE RBC
Folate, Hemolysate: 387 ng/mL
Folate, RBC: 1187 ng/mL (ref >498)
Hematocrit: 32.6 % — ABNORMAL LOW (ref 34.0–46.6)

## 2023-03-15 ENCOUNTER — Encounter: Payer: Self-pay | Admitting: Cardiology

## 2023-03-15 ENCOUNTER — Telehealth: Payer: Self-pay | Admitting: Hematology

## 2023-03-15 LAB — KAPPA/LAMBDA LIGHT CHAINS
Kappa free light chain: 26.7 mg/L — ABNORMAL HIGH (ref 3.3–19.4)
Kappa, lambda light chain ratio: 1.26 (ref 0.26–1.65)
Lambda free light chains: 21.2 mg/L (ref 5.7–26.3)

## 2023-03-18 ENCOUNTER — Inpatient Hospital Stay (HOSPITAL_BASED_OUTPATIENT_CLINIC_OR_DEPARTMENT_OTHER): Payer: Medicare HMO | Admitting: Hematology

## 2023-03-18 DIAGNOSIS — D649 Anemia, unspecified: Secondary | ICD-10-CM | POA: Diagnosis not present

## 2023-03-18 DIAGNOSIS — Z7982 Long term (current) use of aspirin: Secondary | ICD-10-CM | POA: Diagnosis not present

## 2023-03-18 DIAGNOSIS — D696 Thrombocytopenia, unspecified: Secondary | ICD-10-CM | POA: Diagnosis not present

## 2023-03-18 DIAGNOSIS — E785 Hyperlipidemia, unspecified: Secondary | ICD-10-CM | POA: Diagnosis not present

## 2023-03-18 DIAGNOSIS — Z7901 Long term (current) use of anticoagulants: Secondary | ICD-10-CM | POA: Diagnosis not present

## 2023-03-18 DIAGNOSIS — Z79899 Other long term (current) drug therapy: Secondary | ICD-10-CM | POA: Diagnosis not present

## 2023-03-18 DIAGNOSIS — Z7984 Long term (current) use of oral hypoglycemic drugs: Secondary | ICD-10-CM | POA: Diagnosis not present

## 2023-03-18 DIAGNOSIS — I4892 Unspecified atrial flutter: Secondary | ICD-10-CM | POA: Diagnosis not present

## 2023-03-18 DIAGNOSIS — E7800 Pure hypercholesterolemia, unspecified: Secondary | ICD-10-CM | POA: Diagnosis not present

## 2023-03-18 DIAGNOSIS — E538 Deficiency of other specified B group vitamins: Secondary | ICD-10-CM | POA: Diagnosis not present

## 2023-03-18 DIAGNOSIS — I2510 Atherosclerotic heart disease of native coronary artery without angina pectoris: Secondary | ICD-10-CM | POA: Diagnosis not present

## 2023-03-18 DIAGNOSIS — E119 Type 2 diabetes mellitus without complications: Secondary | ICD-10-CM | POA: Diagnosis not present

## 2023-03-18 DIAGNOSIS — I10 Essential (primary) hypertension: Secondary | ICD-10-CM | POA: Diagnosis not present

## 2023-03-18 DIAGNOSIS — E559 Vitamin D deficiency, unspecified: Secondary | ICD-10-CM | POA: Diagnosis not present

## 2023-03-18 MED ORDER — POLYSACCHARIDE IRON COMPLEX 150 MG PO CAPS: 150.0000 mg | ORAL_CAPSULE | Freq: Every day | ORAL | 3 refills | Status: DC
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
## 2023-03-20 NOTE — Progress Notes (Signed)
External Labs: Collected: Mar 02, 2023 provided by PCP. A1c 5.6. TSH 2.0 Total cholesterol 140, triglycerides 70, HDL 74, LDL calculated 52 Hemoglobin 12.4, hematocrit 38.2% BUN 18, creatinine 1.04 Sodium 144, potassium 4.3, chloride 106, bicarb 24. AST 27, ALT 21, alkaline phosphatase 99  Lipids and sugars are well-controlled. Continue current medical therapy. No changes warranted at this time.  Jadore Veals [place], DO, [facility]

## 2023-03-22 LAB — MULTIPLE MYELOMA PANEL, SERUM
Albumin SerPl Elph-Mcnc: 3.4 g/dL (ref 2.9–4.4)
Albumin/Glob SerPl: 1.3 (ref 0.7–1.7)
Alpha 1: 0.2 g/dL (ref 0.0–0.4)
Alpha2 Glob SerPl Elph-Mcnc: 0.7 g/dL (ref 0.4–1.0)
B-Globulin SerPl Elph-Mcnc: 1 g/dL (ref 0.7–1.3)
Gamma Glob SerPl Elph-Mcnc: 0.8 g/dL (ref 0.4–1.8)
Globulin, Total: 2.8 g/dL (ref 2.2–3.9)
IgA: 138 mg/dL (ref 87–352)
IgG (Immunoglobin G), Serum: 1023 mg/dL (ref 586–1602)
IgM (Immunoglobulin M), Srm: 35 mg/dL (ref 26–217)
Total Protein ELP: 6.2 g/dL (ref 6.0–8.5)

## 2023-03-22 NOTE — Progress Notes (Signed)
Called patient to inform her about her lab results patient unberstood

## 2023-04-02 ENCOUNTER — Other Ambulatory Visit: Payer: Self-pay | Admitting: Cardiology

## 2023-04-02 DIAGNOSIS — I2510 Atherosclerotic heart disease of native coronary artery without angina pectoris: Secondary | ICD-10-CM

## 2023-04-19 ENCOUNTER — Other Ambulatory Visit: Payer: Self-pay | Admitting: Cardiology

## 2023-05-07 ENCOUNTER — Other Ambulatory Visit: Payer: Self-pay | Admitting: Cardiology

## 2023-05-19 ENCOUNTER — Ambulatory Visit: Payer: Medicare HMO

## 2023-05-19 DIAGNOSIS — I2510 Atherosclerotic heart disease of native coronary artery without angina pectoris: Secondary | ICD-10-CM

## 2023-05-19 DIAGNOSIS — Z955 Presence of coronary angioplasty implant and graft: Secondary | ICD-10-CM | POA: Diagnosis not present

## 2023-05-30 ENCOUNTER — Ambulatory Visit: Payer: Medicare HMO | Admitting: Cardiology

## 2023-05-30 ENCOUNTER — Encounter: Payer: Self-pay | Admitting: Cardiology

## 2023-05-30 VITALS — BP 145/70 | HR 70 | Resp 16 | Ht 68.0 in | Wt 200.0 lb

## 2023-05-30 DIAGNOSIS — I10 Essential (primary) hypertension: Secondary | ICD-10-CM

## 2023-05-30 DIAGNOSIS — E781 Pure hyperglyceridemia: Secondary | ICD-10-CM | POA: Diagnosis not present

## 2023-05-30 DIAGNOSIS — I4892 Unspecified atrial flutter: Secondary | ICD-10-CM | POA: Diagnosis not present

## 2023-05-30 DIAGNOSIS — Z955 Presence of coronary angioplasty implant and graft: Secondary | ICD-10-CM | POA: Diagnosis not present

## 2023-05-30 DIAGNOSIS — E119 Type 2 diabetes mellitus without complications: Secondary | ICD-10-CM

## 2023-05-30 DIAGNOSIS — Z7901 Long term (current) use of anticoagulants: Secondary | ICD-10-CM

## 2023-05-30 DIAGNOSIS — E782 Mixed hyperlipidemia: Secondary | ICD-10-CM

## 2023-05-30 DIAGNOSIS — I2510 Atherosclerotic heart disease of native coronary artery without angina pectoris: Secondary | ICD-10-CM | POA: Diagnosis not present

## 2023-05-30 DIAGNOSIS — Z6830 Body mass index (BMI) 30.0-30.9, adult: Secondary | ICD-10-CM | POA: Diagnosis not present

## 2023-05-30 DIAGNOSIS — E6609 Other obesity due to excess calories: Secondary | ICD-10-CM

## 2023-05-30 NOTE — Progress Notes (Signed)
[name] Date of Birth: 1950/11/11 MRN: [id number] Primary Care Provider:Prevost, Gaylyn Lambert, FNP Primary Cardiologist: Tessa Lerner, DO, [facility]  (established care 02/04/2020).   Date: 05/30/23 Last Office Visit: 05/21/2022  Chief Complaint  Patient presents with   Paroxysmal atrial flutter (HCC)   Follow-up   Atherosclerosis of native coronary artery of native heart w    HPI  [name] is a 72 y.o. female whose past medical history and cardiac risk factors include: Coronary artery disease status post angioplasty and stent, hypertension, hyperlipidemia, hypothyroidism, GERD, postmenopausal female, advanced age, non-insulin-dependent diabetes mellitus type 2, Paroxysmal atrial flutter during stress test.  Patient was referred to the practice back in April 2021 for abnormal EKG and multiple cardiovascular risk factors.  Ischemic workup noted obstructive disease in the LAD distribution she underwent angiography and PCI to the LAD.  Since then she has remained asymptomatic.  She also has paroxysmal atrial flutter for which she is currently on medical therapy and remains in sinus rhythm.  She presents today for 1 year follow-up visit.  She is here with her older sister and patient provides verbal consent having her present for today's encounter.  Over the last 1 year she denies anginal chest pain or heart failure symptoms.  No use of sublingual nitroglycerin tablets.  No hospitalizations or urgent care visits for cardiovascular reasons.  Overall functional capacity remains relatively stable (a bit limited due to reduced motivation).     ALLERGIES: Allergies  Allergen Reactions   Benazepril Cough   Ciprofloxacin Itching   Naproxen Sodium Other (See Comments)    Stomach burns    MEDICATION LIST PRIOR TO VISIT: Current Outpatient Medications on File Prior to Visit  Medication Sig Dispense Refill   acetaminophen (TYLENOL) 500 MG tablet Take 500 mg by mouth every 6 (six) hours as  needed for mild pain or headache.     apixaban (ELIQUIS) 5 MG TABS tablet Take 1 tablet (5 mg total) by mouth 2 (two) times daily. 180 tablet 1   aspirin EC 81 MG tablet Take 1 tablet (81 mg total) by mouth daily. Swallow whole. 30 tablet 11   atorvastatin (LIPITOR) 80 MG tablet Take 1 tablet by mouth once daily 90 tablet 0   Cholecalciferol 125 MCG (5000 UT) TABS Take by mouth.     cyanocobalamin (VITAMIN B12) 1000 MCG tablet Take 1 tablet by mouth daily.     escitalopram (LEXAPRO) 20 MG tablet Take 20 mg by mouth daily.     ezetimibe (ZETIA) 10 MG tablet Take 1 tablet by mouth once daily 90 tablet 0   irbesartan (AVAPRO) 300 MG tablet Take 300 mg by mouth daily.     iron polysaccharides (NIFEREX) 150 MG capsule Take 1 capsule (150 mg total) by mouth daily. 30 capsule 3   levothyroxine (SYNTHROID, LEVOTHROID) 100 MCG tablet Take 100 mcg by mouth at bedtime.     memantine (NAMENDA) 10 MG tablet Take 10 mg by mouth daily.     metFORMIN (GLUCOPHAGE-XR) 500 MG 24 hr tablet Take 1,000 mg by mouth daily.     metoprolol succinate (TOPROL XL) 50 MG 24 hr tablet Take 1 tablet (50 mg total) by mouth in the morning. Hold if systolic Anderson pressure (top Anderson pressure number) less than 100 mmHg or heart rate less than 60 bpm (pulse). 90 tablet 2   nitroGLYCERIN (NITROSTAT) 0.4 MG SL tablet Place 1 tablet (0.4 mg total) under the tongue every 5 (five) minutes as needed for  chest pain. If you require more than two tablets five minutes apart go to the nearest ER via EMS. (Patient taking differently: Place 0.4 mg under the tongue every 5 (five) minutes as needed for chest pain (PRN only). If you require more than two tablets five minutes apart go to the nearest ER via EMS.) 30 tablet 0   pantoprazole (PROTONIX) 40 MG tablet Take 1 tablet by mouth once daily 90 tablet 0   Semaglutide,0.25 or 0.5MG /DOS, (OZEMPIC, 0.25 OR 0.5 MG/DOSE,) 2 MG/3ML SOPN Inject 0.5 mg into the skin once a week.     VASCEPA 1 g capsule  Take 2 capsules by mouth twice daily 360 capsule 0   No current facility-administered medications on file prior to visit.    PAST MEDICAL HISTORY: Past Medical History:  Diagnosis Date   Arthritis    Coronary artery disease    Depression    Diabetes mellitus without complication (HCC)    GERD (gastroesophageal reflux disease)    Hypercholesteremia    Hypertension    Hypothyroidism    Pre-diabetes 08/19/2019   Sleep apnea    mild-modreate-tried cpap    PAST SURGICAL HISTORY: Past Surgical History:  Procedure Laterality Date   CARPAL TUNNEL RELEASE     right   COLONOSCOPY     CORONARY ANGIOPLASTY WITH STENT PLACEMENT     CORONARY STENT INTERVENTION N/A 03/18/2020   Procedure: CORONARY STENT INTERVENTION;  Surgeon: Elder Negus, MD;  Location: [facility];  Service: Cardiovascular;  Laterality: N/A;   CORONARY ULTRASOUND/IVUS N/A 03/18/2020   Procedure: Intravascular Ultrasound/IVUS;  Surgeon: Elder Negus, MD;  Location: [facility];  Service: Cardiovascular;  Laterality: N/A;   DILATION AND CURETTAGE OF UTERUS     LEFT HEART CATH AND CORONARY ANGIOGRAPHY N/A 03/18/2020   Procedure: LEFT HEART CATH AND CORONARY ANGIOGRAPHY;  Surgeon: Elder Negus, MD;  Location: [facility];  Service: Cardiovascular;  Laterality: N/A;   ORIF ANKLE FRACTURE  04/13/2012   Procedure: OPEN REDUCTION INTERNAL FIXATION (ORIF) ANKLE FRACTURE;  Surgeon: Toni Arthurs, MD;  Location: [facility];  Service: Orthopedics;  Laterality: Left;   TONSILLECTOMY      FAMILY HISTORY: The patient family history includes Breast cancer in her sister; Colon cancer in her father; Dementia in her mother; Heart attack in her father; Hypertension in her sister, sister, sister, and sister.   SOCIAL HISTORY:  The patient  reports that she has never smoked. She has never used smokeless tobacco. She reports that she does not currently use alcohol. She reports that she does  not use drugs.  REVIEW OF SYSTEMS: Review of Systems  Constitutional: Positive for weight loss.  Cardiovascular:  Negative for chest pain, dyspnea on exertion, leg swelling, orthopnea, palpitations, paroxysmal nocturnal dyspnea and syncope.  Neurological:  Negative for dizziness and light-headedness.    PHYSICAL EXAM:    05/30/2023    9:43 AM 03/11/2023   11:04 AM 09/14/2022    6:00 AM  Vitals with BMI  Height 5\' 8"  5\' 8"    Weight 200 lbs 202 lbs 11 oz   BMI 30.42 30.83   Systolic 145 151 540  Diastolic 70 92 66  Pulse 70 62 66    Physical Exam  Constitutional: No distress.  Age appropriate, hemodynamically stable.   Neck: No JVD present.  Cardiovascular: Normal rate, regular rhythm, S1 normal, S2 normal, intact distal pulses and normal pulses. Exam reveals no gallop, no S3 and no  S4.  No murmur heard. Pulmonary/Chest: Effort normal and breath sounds normal. No stridor. She has no wheezes. She has no rales.  Abdominal: Soft. Bowel sounds are normal. She exhibits no distension. There is no abdominal tenderness.  Musculoskeletal:        General: No edema.     Cervical back: Neck supple.  Neurological: She is alert and oriented to person, place, and time. She has intact cranial nerves (2-12).  Skin: Skin is warm and moist.    CARDIAC DATABASE: EKG: 8/12//2024: Sinus rhythm, 71 bpm, diffuse ST-T changes consider anterior anterolateral ischemia.  Similar findings on prior EKG 05/21/2022  Echocardiogram: 02/22/2020: LVEF 59%, moderate concentric LVH, grade 2 diastolic dysfunction, severely dilated left atrium, mild TR.   05/19/2023:  Left ventricle cavity is normal in size. Moderate concentric hypertrophy  of the left ventricle. Normal global wall motion. Normal LV systolic  function with EF 66%. Doppler evidence of grade I (impaired) diastolic  dysfunction, normal LAP.  Left atrial cavity is mildly dilated.  Mild (Grade I) mitral regurgitation.  Mild tricuspid regurgitation.   Small anterior pericardial effusion. No hemodynamic significance.  No evidence of pulmonary hypertension.  Unlike previous study in 2021, intracavitary gradient and concern for apical hypertrophic cardiomyopathy not well appreciated.   Stress Testing: Lexiscan (Walking with mod Bruce)Tetrofosmin Stress Test  02/18/2020: Resting EKG/ECG demonstrated atypical atrial flutter with rapid ventricular response. Observed left ventricular hypertrophy with secondary ST-T changes, repolarization abnormality. Peak EKG/ECG revealed no significant ST-T change from baseline abnormality. Patient achieved 101% of MPHR with modified Bruce protocol.  During infusion the recovery ECG revealed occasional premature  There is a reversible mild defect in the distal anterior and apical regions.  Overall LV systolic function is normal with regional wall motion abnormalities in the same region. Stress LV EF: 55%.  Low risk in view of size of defect and preserved LVEF.    Coronary CTA w/ FFR.  03/07/2020:  1. Mild non-obstructive CAD of the proximal and mid-LAD and proximal LCx, CADRADS = 2. The mid-LAD stenosis may be more significant,however, there is blooming and alignment artifact. Will submit study for FFR, given the abnormal myoview results in the distal anterior and apical regions. 2. Coronary calcium score of 148. This was 82nd percentile for age and sex matched control. 3. Normal coronary origin with right dominance. 4. Dilated main pulmonary artery to 37 mm, suggesting pulmonary hypertension. 5.  Aortic atherosclerosis CT FFR ANALYSIS 1. CT FFR demonstrates flow-limiting stenosis of the proximal to mid-LAD, just upstream of the D1 branch, affecting the mid to distal LAD and a large D2 branch. LAD: Significant stenosis. Proximal FFR = 0.96, Mid FFR = 0.66, Distal FFR = 0.60  Heart Catheterization: March 18, 2020: LM: Normal LAD: Prox 75-80% stenosis. Mild calcification.        Mid 75-80% stenosis. Mild  calcification.        Prox D1 50% stenosis. LCx: Prox 20% stenosis. RCA: Prox 20% stenosis.        PTCA and stent placement 2.5 X 12 mm Resolute Onyx drug-eluting stent mid LAD     PTCA and stent placement 3.5 X 18 mm Resolute Onyx drug-eluting stent prox LAD  LABORATORY DATA:    Latest Ref Rng & Units 03/11/2023   12:18 PM 09/13/2022    4:17 PM 09/13/2022    4:15 PM  BMP  Glucose 70 - 99 mg/dL 88  85  86   BUN 8 - 23 mg/dL 19  16  15   Creatinine 0.44 - 1.00 mg/dL 7.25  3.66  4.40   Sodium 135 - 145 mmol/L 139  143  139   Potassium 3.5 - 5.1 mmol/L 4.4  3.6  3.5   Chloride 98 - 111 mmol/L 106  113  116   CO2 22 - 32 mmol/L 28   17   Calcium 8.9 - 10.3 mg/dL 9.5   7.8       Latest Ref Rng & Units 03/11/2023   12:18 PM 09/13/2022    4:17 PM 09/13/2022    4:15 PM  CBC  WBC 4.0 - 10.5 K/uL 6.5   5.9   Hemoglobin 12.0 - 15.0 g/dL 34.7  9.5  9.8   Hematocrit 34.0 - 46.6 % 36.0 - 46.0 % 32.6    32.6  28.0  30.4   Platelets 150 - 400 K/uL 209   135    Lipid Panel     Component Value Date/Time   CHOL 150 07/22/2021 0838   TRIG 225 (H) 07/22/2021 0838   HDL 55 07/22/2021 0838   LDLCALC 59 07/22/2021 0838   LDLDIRECT 53 07/22/2021 0839   LABVLDL 36 07/22/2021 0838   Lipid profile: 07/11/2019: Total cholesterol 203, triglycerides 208, HDL 56, LDL 113 01/07/2020: Total cholesterol 231, triglycerides 303, HDL 55, LDL 123  Hemoglobin A1c: 07/11/2019: 6.6 01/07/2020: 6.6  TSH:  01/01/2019 1.62  Serum creatinine: 01/07/2020 0.98 mg/dL  Collected 42/59/5638: Total cholesterol 132, triglycerides 147, HDL 54, LDL 49, non-HDL 78  External Labs: Collected: 11/18/2021 provided by PCP. Hemoglobin A1c 5.7. TSH 0.14 (normal value 0.27-4.2) Hemoglobin 11.6 g/dL, hematocrit 75.6%. Sodium 140, potassium 4.1, chloride 104, bicarb 28, BUN 19, creatinine 1.29. eGFR 42 mL/min per 1.73 m AST 18, ALT 20, alkaline phosphatase 83  External Labs: Collected: Feb 26, 2022 provided by  PCP. Total cholesterol 130, triglycerides 88, HDL 62, LDL 50, non-HDL 68. TSH 0.78 Hemoglobin 11.6, hematocrit 35.6% Sodium 143, potassium 4.3, chloride 106, bicarb 27 BUN 18, creatinine 1.2,  AST 20, ALT 25, alkaline phosphatase 88  External Labs: Collected: Mar 02, 2023 provided by PCP. A1c 5.6. TSH 2.0 Total cholesterol 140, triglycerides 70, HDL 74, LDL calculated 52 Hemoglobin 12.4, hematocrit 38.2% BUN 18, creatinine 1.04 Sodium 144, potassium 4.3, chloride 106, bicarb 24. AST 27, ALT 21, alkaline phosphatase 99  FINAL MEDICATION LIST END OF ENCOUNTER: No orders of the defined types were placed in this encounter.    Current Outpatient Medications:    acetaminophen (TYLENOL) 500 MG tablet, Take 500 mg by mouth every 6 (six) hours as needed for mild pain or headache., Disp: , Rfl:    apixaban (ELIQUIS) 5 MG TABS tablet, Take 1 tablet (5 mg total) by mouth 2 (two) times daily., Disp: 180 tablet, Rfl: 1   aspirin EC 81 MG tablet, Take 1 tablet (81 mg total) by mouth daily. Swallow whole., Disp: 30 tablet, Rfl: 11   atorvastatin (LIPITOR) 80 MG tablet, Take 1 tablet by mouth once daily, Disp: 90 tablet, Rfl: 0   Cholecalciferol 125 MCG (5000 UT) TABS, Take by mouth., Disp: , Rfl:    cyanocobalamin (VITAMIN B12) 1000 MCG tablet, Take 1 tablet by mouth daily., Disp: , Rfl:    escitalopram (LEXAPRO) 20 MG tablet, Take 20 mg by mouth daily., Disp: , Rfl:    ezetimibe (ZETIA) 10 MG tablet, Take 1 tablet by mouth once daily, Disp: 90 tablet, Rfl: 0   irbesartan (AVAPRO) 300 MG tablet, Take 300 mg by mouth  daily., Disp: , Rfl:    iron polysaccharides (NIFEREX) 150 MG capsule, Take 1 capsule (150 mg total) by mouth daily., Disp: 30 capsule, Rfl: 3   levothyroxine (SYNTHROID, LEVOTHROID) 100 MCG tablet, Take 100 mcg by mouth at bedtime., Disp: , Rfl:    memantine (NAMENDA) 10 MG tablet, Take 10 mg by mouth daily., Disp: , Rfl:    metFORMIN (GLUCOPHAGE-XR) 500 MG 24 hr tablet, Take 1,000 mg  by mouth daily., Disp: , Rfl:    metoprolol succinate (TOPROL XL) 50 MG 24 hr tablet, Take 1 tablet (50 mg total) by mouth in the morning. Hold if systolic Anderson pressure (top Anderson pressure number) less than 100 mmHg or heart rate less than 60 bpm (pulse)., Disp: 90 tablet, Rfl: 2   nitroGLYCERIN (NITROSTAT) 0.4 MG SL tablet, Place 1 tablet (0.4 mg total) under the tongue every 5 (five) minutes as needed for chest pain. If you require more than two tablets five minutes apart go to the nearest ER via EMS. (Patient taking differently: Place 0.4 mg under the tongue every 5 (five) minutes as needed for chest pain (PRN only). If you require more than two tablets five minutes apart go to the nearest ER via EMS.), Disp: 30 tablet, Rfl: 0   pantoprazole (PROTONIX) 40 MG tablet, Take 1 tablet by mouth once daily, Disp: 90 tablet, Rfl: 0   Semaglutide,0.25 or 0.5MG /DOS, (OZEMPIC, 0.25 OR 0.5 MG/DOSE,) 2 MG/3ML SOPN, Inject 0.5 mg into the skin once a week., Disp: , Rfl:    VASCEPA 1 g capsule, Take 2 capsules by mouth twice daily, Disp: 360 capsule, Rfl: 0  IMPRESSION:    ICD-10-CM   1. Paroxysmal atrial flutter (HCC)  I48.92 EKG 12-Lead    2. Long term (current) use of anticoagulants  Z79.01     3. Atherosclerosis of native coronary artery of native heart without angina pectoris  I25.10     4. History of coronary angioplasty with insertion of stent  Z95.5     5. Essential hypertension  I10     6. Type 2 diabetes mellitus without complication, without long-term current use of insulin (HCC)  E11.9     7. Hypertriglyceridemia  E78.1     8. Mixed hyperlipidemia  E78.2     9. Class 1 obesity due to excess calories with serious comorbidity and body mass index (BMI) of 30.0 to 30.9 in adult  E66.09    Z68.30        RECOMMENDATIONS: [name] is a 72 y.o. female whose past medical history and cardiac risk factors include: Coronary artery disease status post angioplasty and stent to the proximal  and mid LAD, hypertension, hyperlipidemia, hypothyroidism, GERD, postmenopausal female, advanced age, non-insulin-dependent diabetes mellitus type 2, paroxysmal atrial flutter during stress test.  Paroxysmal atrial flutter (HCC) Long-term oral anticoagulation Rate control: Toprol-XL. Rhythm control: N/A. Thromboembolic prophylaxis: Eliquis. CHA2DS2-VASc SCORE is 5 which correlates to 6.7% risk of stroke per year (age, gender, DM, HTN, aortic atherosclerosis).  Patient does not endorse evidence of bleeding. Risks, benefits, and alternatives to anticoagulation discussed. Hemoglobin remains stable when compared to May 2023.  Atherosclerosis of native coronary artery of native heart without angina pectoris Status post angioplasty and stent Denies angina pectoris.   No use of sublingual nitroglycerin tablets since last office visit.  EKG illustrates sinus rhythm with ST-T changes similar to prior ECGs Prior ischemic work-up reviewed as part of today's office visit. Recent echocardiogram-results reviewed independently with the patient and her  sister at today's visit. Continue aspirin and statin therapy/Zetia  Essential hypertension Office Anderson pressures are acceptable. At times complains of symptoms of orthostasis when changing position quickly. Will hold off on further medication titration. She is asked to keep a log of her Anderson pressures at home and to call the office or PCP for further guidance if consistently elevated  Type 2 diabetes mellitus without complication, without long-term current use of insulin (HCC) Reemphasized importance of glycemic control. Currently managed by primary care provider.  Hypertriglyceridemia Triglycerides are very well controlled. Currently on Vascepa. Most recent lipids independently reviewed as noted above  Mixed hyperlipidemia LDL currently at goal. Most recent lipids from May 2024 independently reviewed and noted above.  Orders Placed This  Encounter  Procedures   EKG 12-Lead   --Continue cardiac medications as reconciled in final medication list. --Return in about 1 year (around 05/29/2024) for Follow up, CAD, Atrial flutter. Or sooner if needed. --Continue follow-up with your primary care physician regarding the management of your other chronic comorbid conditions.  Patient's questions and concerns were addressed to her satisfaction. She voices understanding of the instructions provided during this encounter.   This note was created using a voice recognition software as a result there may be grammatical errors inadvertently enclosed that do not reflect the nature of this encounter. Every attempt is made to correct such errors.  Tessa Lerner, [place], [facility]  Pager:  [phone] Office: [phone]

## 2023-06-01 NOTE — Telephone Encounter (Signed)
done

## 2023-06-24 ENCOUNTER — Other Ambulatory Visit: Payer: Self-pay

## 2023-06-24 DIAGNOSIS — I4892 Unspecified atrial flutter: Secondary | ICD-10-CM

## 2023-06-24 MED ORDER — METOPROLOL SUCCINATE ER 50 MG PO TB24
50.0000 mg | ORAL_TABLET | Freq: Every morning | ORAL | 2 refills | Status: DC
Start: 2023-06-24 — End: 2024-03-16

## 2023-07-09 ENCOUNTER — Other Ambulatory Visit: Payer: Self-pay | Admitting: Cardiology

## 2023-07-09 DIAGNOSIS — I2510 Atherosclerotic heart disease of native coronary artery without angina pectoris: Secondary | ICD-10-CM

## 2023-07-23 ENCOUNTER — Other Ambulatory Visit: Payer: Self-pay | Admitting: Hematology

## 2023-07-24 ENCOUNTER — Other Ambulatory Visit: Payer: Self-pay | Admitting: Cardiology

## 2023-08-05 ENCOUNTER — Other Ambulatory Visit: Payer: Self-pay | Admitting: Cardiology

## 2023-08-28 ENCOUNTER — Other Ambulatory Visit: Payer: Self-pay | Admitting: Hematology

## 2023-08-29 DIAGNOSIS — I2510 Atherosclerotic heart disease of native coronary artery without angina pectoris: Secondary | ICD-10-CM | POA: Diagnosis not present

## 2023-08-29 DIAGNOSIS — R7303 Prediabetes: Secondary | ICD-10-CM | POA: Diagnosis not present

## 2023-08-29 DIAGNOSIS — D696 Thrombocytopenia, unspecified: Secondary | ICD-10-CM | POA: Diagnosis not present

## 2023-08-30 LAB — LAB REPORT - SCANNED
A1c: 5.6
EGFR: 57

## 2023-09-05 DIAGNOSIS — Z955 Presence of coronary angioplasty implant and graft: Secondary | ICD-10-CM | POA: Diagnosis not present

## 2023-09-05 DIAGNOSIS — D696 Thrombocytopenia, unspecified: Secondary | ICD-10-CM | POA: Diagnosis not present

## 2023-09-05 DIAGNOSIS — E039 Hypothyroidism, unspecified: Secondary | ICD-10-CM | POA: Diagnosis not present

## 2023-09-05 DIAGNOSIS — Z7901 Long term (current) use of anticoagulants: Secondary | ICD-10-CM | POA: Diagnosis not present

## 2023-09-05 DIAGNOSIS — I10 Essential (primary) hypertension: Secondary | ICD-10-CM | POA: Diagnosis not present

## 2023-09-05 DIAGNOSIS — E7800 Pure hypercholesterolemia, unspecified: Secondary | ICD-10-CM | POA: Diagnosis not present

## 2023-09-05 DIAGNOSIS — I2510 Atherosclerotic heart disease of native coronary artery without angina pectoris: Secondary | ICD-10-CM | POA: Diagnosis not present

## 2023-09-05 DIAGNOSIS — R413 Other amnesia: Secondary | ICD-10-CM | POA: Diagnosis not present

## 2023-09-05 DIAGNOSIS — I4892 Unspecified atrial flutter: Secondary | ICD-10-CM | POA: Diagnosis not present

## 2023-09-05 DIAGNOSIS — R7303 Prediabetes: Secondary | ICD-10-CM | POA: Diagnosis not present

## 2023-09-05 NOTE — Progress Notes (Signed)
External Labs: Collected: November 2024 provided by her primary care Hemoglobin 12.4. BUN 12, creatinine 1.05. eGFR 57. Sodium 140, potassium 4, chloride 105, bicarb 21. AST and ALT within normal limits. A1c 5.6. Total cholesterol 144, triglycerides 100, HDL 70, LDL calculated 56  We will review the results at the upcoming office visit.  Regards,   Stana Bayon [place], DO, [facility]

## 2023-09-06 DIAGNOSIS — G309 Alzheimer's disease, unspecified: Secondary | ICD-10-CM | POA: Diagnosis not present

## 2023-09-06 DIAGNOSIS — F0280 Dementia in other diseases classified elsewhere without behavioral disturbance: Secondary | ICD-10-CM | POA: Diagnosis not present

## 2023-09-25 ENCOUNTER — Emergency Department (HOSPITAL_BASED_OUTPATIENT_CLINIC_OR_DEPARTMENT_OTHER): Payer: Medicare HMO | Admitting: Radiology

## 2023-09-25 ENCOUNTER — Other Ambulatory Visit: Payer: Self-pay

## 2023-09-25 ENCOUNTER — Encounter (HOSPITAL_BASED_OUTPATIENT_CLINIC_OR_DEPARTMENT_OTHER): Payer: Self-pay

## 2023-09-25 ENCOUNTER — Observation Stay (HOSPITAL_BASED_OUTPATIENT_CLINIC_OR_DEPARTMENT_OTHER)
Admission: EM | Admit: 2023-09-25 | Discharge: 2023-09-27 | Disposition: A | Discharge status: Home / Self Care | Payer: Medicare HMO | Attending: Internal Medicine | Admitting: Internal Medicine

## 2023-09-25 DIAGNOSIS — Z79899 Other long term (current) drug therapy: Secondary | ICD-10-CM | POA: Insufficient documentation

## 2023-09-25 DIAGNOSIS — I480 Paroxysmal atrial fibrillation: Secondary | ICD-10-CM | POA: Insufficient documentation

## 2023-09-25 DIAGNOSIS — Z7982 Long term (current) use of aspirin: Secondary | ICD-10-CM | POA: Diagnosis not present

## 2023-09-25 DIAGNOSIS — Z7985 Long-term (current) use of injectable non-insulin antidiabetic drugs: Secondary | ICD-10-CM | POA: Insufficient documentation

## 2023-09-25 DIAGNOSIS — E785 Hyperlipidemia, unspecified: Secondary | ICD-10-CM | POA: Insufficient documentation

## 2023-09-25 DIAGNOSIS — Z7984 Long term (current) use of oral hypoglycemic drugs: Secondary | ICD-10-CM | POA: Diagnosis not present

## 2023-09-25 DIAGNOSIS — I10 Essential (primary) hypertension: Secondary | ICD-10-CM | POA: Diagnosis not present

## 2023-09-25 DIAGNOSIS — E039 Hypothyroidism, unspecified: Secondary | ICD-10-CM | POA: Insufficient documentation

## 2023-09-25 DIAGNOSIS — Z955 Presence of coronary angioplasty implant and graft: Secondary | ICD-10-CM | POA: Insufficient documentation

## 2023-09-25 DIAGNOSIS — E119 Type 2 diabetes mellitus without complications: Secondary | ICD-10-CM | POA: Diagnosis not present

## 2023-09-25 DIAGNOSIS — Z7901 Long term (current) use of anticoagulants: Secondary | ICD-10-CM | POA: Insufficient documentation

## 2023-09-25 DIAGNOSIS — I25119 Atherosclerotic heart disease of native coronary artery with unspecified angina pectoris: Principal | ICD-10-CM | POA: Insufficient documentation

## 2023-09-25 DIAGNOSIS — R079 Chest pain, unspecified: Secondary | ICD-10-CM | POA: Diagnosis not present

## 2023-09-25 DIAGNOSIS — I2510 Atherosclerotic heart disease of native coronary artery without angina pectoris: Secondary | ICD-10-CM | POA: Diagnosis not present

## 2023-09-25 DIAGNOSIS — I209 Angina pectoris, unspecified: Secondary | ICD-10-CM | POA: Diagnosis present

## 2023-09-25 DIAGNOSIS — R0789 Other chest pain: Secondary | ICD-10-CM | POA: Diagnosis not present

## 2023-09-25 DIAGNOSIS — E118 Type 2 diabetes mellitus with unspecified complications: Secondary | ICD-10-CM | POA: Insufficient documentation

## 2023-09-25 LAB — COMPREHENSIVE METABOLIC PANEL
ALT: 10 U/L (ref 0–44)
AST: 16 U/L (ref 15–41)
Albumin: 4.4 g/dL (ref 3.5–5.0)
Alkaline Phosphatase: 66 U/L (ref 38–126)
Anion gap: 13 (ref 5–15)
BUN: 13 mg/dL (ref 8–23)
CO2: 19 mmol/L — ABNORMAL LOW (ref 22–32)
Calcium: 9.8 mg/dL (ref 8.9–10.3)
Chloride: 105 mmol/L (ref 98–111)
Creatinine, Ser: 0.91 mg/dL (ref 0.44–1.00)
GFR, Estimated: >60 mL/min (ref >60)
Glucose, Bld: 113 mg/dL — ABNORMAL HIGH (ref 70–99)
Potassium: 3.7 mmol/L (ref 3.5–5.1)
Sodium: 137 mmol/L (ref 135–145)
Total Bilirubin: 0.8 mg/dL (ref <1.2)
Total Protein: 7.3 g/dL (ref 6.5–8.1)

## 2023-09-25 LAB — CBC WITH DIFFERENTIAL/PLATELET
Abs Immature Granulocytes: 0.03 10*3/uL (ref 0.00–0.07)
Basophils Absolute: 0 10*3/uL (ref 0.0–0.1)
Basophils Relative: 0 %
Eosinophils Absolute: 0 10*3/uL (ref 0.0–0.5)
Eosinophils Relative: 0 %
HCT: 37.6 % (ref 36.0–46.0)
Hemoglobin: 13.2 g/dL (ref 12.0–15.0)
Immature Granulocytes: 0 %
Lymphocytes Relative: 27 %
Lymphs Abs: 2.5 10*3/uL (ref 0.7–4.0)
MCH: 33 pg (ref 26.0–34.0)
MCHC: 35.1 g/dL (ref 30.0–36.0)
MCV: 94 fL (ref 80.0–100.0)
Monocytes Absolute: 0.6 10*3/uL (ref 0.1–1.0)
Monocytes Relative: 7 %
Neutro Abs: 6.1 10*3/uL (ref 1.7–7.7)
Neutrophils Relative %: 66 %
Platelets: 169 10*3/uL (ref 150–400)
RBC: 4 MIL/uL (ref 3.87–5.11)
RDW: 13.1 % (ref 11.5–15.5)
WBC: 9.3 10*3/uL (ref 4.0–10.5)
nRBC: 0 % (ref 0.0–0.2)

## 2023-09-25 LAB — TROPONIN I (HIGH SENSITIVITY)
Troponin I (High Sensitivity): 33 ng/L — ABNORMAL HIGH (ref <18)
Troponin I (High Sensitivity): 29 ng/L — ABNORMAL HIGH (ref <18)

## 2023-09-25 LAB — CBG MONITORING, ED: Glucose-Capillary: 84 mg/dL (ref 70–99)

## 2023-09-25 MED ORDER — METOCLOPRAMIDE HCL 5 MG/ML IJ SOLN: 10.0000 mg | Freq: Once | INTRAMUSCULAR | Status: DC

## 2023-09-25 MED ORDER — IRBESARTAN 150 MG PO TABS
300.0000 mg | ORAL_TABLET | Freq: Every day | ORAL | Status: DC
  Administered 2023-09-26 – 2023-09-27 (×2): 300 mg via ORAL
  Filled 2023-09-25 (×2): qty 1

## 2023-09-25 MED ORDER — APIXABAN 2.5 MG PO TABS
5.0000 mg | ORAL_TABLET | Freq: Two times a day (BID) | ORAL | Status: AC
  Administered 2023-09-25: 5 mg via ORAL
  Filled 2023-09-25: qty 2

## 2023-09-25 MED ORDER — SODIUM CHLORIDE 0.9 % IV BOLUS
500.0000 mL | Freq: Once | INTRAVENOUS | Status: AC
  Administered 2023-09-25: 500 mL via INTRAVENOUS

## 2023-09-25 MED ORDER — MEMANTINE HCL 10 MG PO TABS
10.0000 mg | ORAL_TABLET | Freq: Two times a day (BID) | ORAL | Status: DC
Start: 2023-09-25 — End: 2023-09-28
  Administered 2023-09-26 – 2023-09-27 (×3): 10 mg via ORAL
  Filled 2023-09-25 (×5): qty 1

## 2023-09-25 MED ORDER — ASPIRIN 81 MG PO CHEW
234.0000 mg | CHEWABLE_TABLET | Freq: Once | ORAL | Status: AC
  Administered 2023-09-25: 243 mg via ORAL
  Filled 2023-09-25: qty 3

## 2023-09-25 MED ORDER — LEVOTHYROXINE SODIUM 100 MCG PO TABS: 100.0000 ug | ORAL_TABLET | Freq: Every day | ORAL | Status: DC

## 2023-09-25 MED ORDER — APIXABAN 2.5 MG PO TABS: 5.0000 mg | ORAL_TABLET | Freq: Two times a day (BID) | ORAL | Status: DC

## 2023-09-25 MED ORDER — EZETIMIBE 10 MG PO TABS
10.0000 mg | ORAL_TABLET | Freq: Every day | ORAL | Status: DC
  Administered 2023-09-26 – 2023-09-27 (×2): 10 mg via ORAL
  Filled 2023-09-25 (×3): qty 1

## 2023-09-25 MED ORDER — PANTOPRAZOLE SODIUM 40 MG PO TBEC
40.0000 mg | DELAYED_RELEASE_TABLET | Freq: Every day | ORAL | Status: DC
  Administered 2023-09-26 – 2023-09-27 (×2): 40 mg via ORAL
  Filled 2023-09-25 (×2): qty 1

## 2023-09-25 MED ORDER — LEVOTHYROXINE SODIUM 88 MCG PO TABS
88.0000 ug | ORAL_TABLET | Freq: Every day | ORAL | Status: DC
Start: 2023-09-26 — End: 2023-09-28
  Administered 2023-09-26: 88 ug via ORAL
  Filled 2023-09-25 (×2): qty 1

## 2023-09-25 MED ORDER — NITROGLYCERIN 0.4 MG SL SUBL
0.4000 mg | SUBLINGUAL_TABLET | Freq: Once | SUBLINGUAL | Status: AC
  Administered 2023-09-25: 0.4 mg via SUBLINGUAL
  Filled 2023-09-25: qty 1

## 2023-09-25 MED ORDER — METOPROLOL SUCCINATE ER 50 MG PO TB24
50.0000 mg | ORAL_TABLET | Freq: Every morning | ORAL | Status: DC
  Administered 2023-09-26 – 2023-09-27 (×2): 50 mg via ORAL
  Filled 2023-09-25 (×2): qty 2

## 2023-09-25 MED ORDER — ESCITALOPRAM OXALATE 10 MG PO TABS
20.0000 mg | ORAL_TABLET | Freq: Every day | ORAL | Status: DC
  Administered 2023-09-26 – 2023-09-27 (×2): 20 mg via ORAL
  Filled 2023-09-25 (×2): qty 2

## 2023-09-25 MED ORDER — ATORVASTATIN CALCIUM 80 MG PO TABS
80.0000 mg | ORAL_TABLET | Freq: Every day | ORAL | Status: DC
  Administered 2023-09-26 – 2023-09-27 (×2): 80 mg via ORAL
  Filled 2023-09-25: qty 1
  Filled 2023-09-25: qty 2

## 2023-09-25 MED ORDER — NITROGLYCERIN 0.4 MG SL SUBL
0.4000 mg | SUBLINGUAL_TABLET | SUBLINGUAL | Status: AC | PRN
Start: 2023-09-25 — End: 2023-09-25
  Administered 2023-09-25 (×2): 0.4 mg via SUBLINGUAL
  Filled 2023-09-25: qty 1

## 2023-09-25 MED ORDER — DIPHENHYDRAMINE HCL 50 MG/ML IJ SOLN: 25.0000 mg | Freq: Once | INTRAMUSCULAR | Status: DC

## 2023-09-25 NOTE — ED Notes (Signed)
Kim with [facility] called for consult

## 2023-09-25 NOTE — ED Notes (Signed)
Pt transported to imaging.

## 2023-09-25 NOTE — ED Provider Notes (Signed)
[place] EMERGENCY DEPARTMENT AT [facility] Provider Note   CSN: [id number] Arrival date & time: 09/25/23  1234     History  Chief Complaint  Patient presents with   Chest Pain    [name] is a 72 y.o. female, hx of CAD, pericardial effusion, who presents to the ED secondary to chest pressure, in the middle of her chest, has been going on since 4 AM this morning.  She states it woke her up out of her sleep, and also endorses dyspnea on exertion has been going on for the last 4 to 5 days.  She denies any swelling of her legs, or palpitations.  No nausea, vomiting, radiation of the pain.  She states she feels a bit lightheaded, but denies any dizziness, or weakness on one side of the body.  States she has taken her daily aspirin, but not any nitroglycerin.  States she cannot remember if it feels similar to last time when she got a stent. Pain is feels tight w/o any nausea or vomiting.      Home Medications Prior to Admission medications   Medication Sig Start Date End Date Taking? Authorizing Provider  acetaminophen (TYLENOL) 500 MG tablet Take 500 mg by mouth every 6 (six) hours as needed for mild pain or headache.    [provider]  apixaban (ELIQUIS) 5 MG TABS tablet Take 1 tablet (5 mg total) by mouth 2 (two) times daily. 02/13/21   Tolia, Sunit, DO  aspirin EC 81 MG tablet Take 1 tablet (81 mg total) by mouth daily. Swallow whole. 07/27/21   Tolia, Sunit, DO  atorvastatin (LIPITOR) 80 MG tablet Take 1 tablet by mouth once daily 07/25/23   Tolia, Sunit, DO  Cholecalciferol 125 MCG (5000 UT) TABS Take by mouth.    [provider]  cyanocobalamin (VITAMIN B12) 1000 MCG tablet Take 1 tablet by mouth daily.    [provider]  escitalopram (LEXAPRO) 20 MG tablet Take 20 mg by mouth daily.    [provider]  ezetimibe (ZETIA) 10 MG tablet Take 1 tablet by mouth once daily 07/12/23   Tolia, Sunit, DO  FERREX 150 150 MG capsule Take 1  capsule by mouth once daily 08/29/23   Johney Maine, MD  irbesartan (AVAPRO) 300 MG tablet Take 300 mg by mouth daily.    [provider]  levothyroxine (SYNTHROID, LEVOTHROID) 100 MCG tablet Take 100 mcg by mouth at bedtime.    [provider]  memantine (NAMENDA) 10 MG tablet Take 10 mg by mouth daily. 12/07/22   [provider]  metFORMIN (GLUCOPHAGE-XR) 500 MG 24 hr tablet Take 1,000 mg by mouth daily. 08/09/20   [provider]  metoprolol succinate (TOPROL XL) 50 MG 24 hr tablet Take 1 tablet (50 mg total) by mouth in the morning. Hold if systolic blood pressure (top blood pressure number) less than 100 mmHg or heart rate less than 60 bpm (pulse). 06/24/23   Tolia, Sunit, DO  nitroGLYCERIN (NITROSTAT) 0.4 MG SL tablet Place 1 tablet (0.4 mg total) under the tongue every 5 (five) minutes as needed for chest pain. If you require more than two tablets five minutes apart go to the nearest ER via EMS. Patient taking differently: Place 0.4 mg under the tongue every 5 (five) minutes as needed for chest pain (PRN only). If you require more than two tablets five minutes apart go to the nearest ER via EMS. 06/26/20 05/30/23  Tessa Lerner, DO  pantoprazole (PROTONIX) 40 MG tablet Take 1 tablet by mouth once daily 07/25/23   Tolia, Sunit, DO  Semaglutide,0.25 or 0.5MG /DOS, (OZEMPIC, 0.25 OR 0.5 MG/DOSE,) 2 MG/3ML SOPN Inject 0.5 mg into the skin once a week.    [provider]  VASCEPA 1 g capsule Take 2 capsules by mouth twice daily 07/12/23   Tolia, Sunit, DO      Allergies    Benazepril, Ciprofloxacin, and Naproxen sodium    Review of Systems   Review of Systems  Constitutional:  Negative for fever.  Respiratory:  Positive for chest tightness and shortness of breath.   Cardiovascular:  Positive for chest pain.    Physical Exam Updated Vital Signs BP 123/63   Pulse 61   Temp 97.7 F (36.5 C) (Oral)   Resp 16   Ht 5\' 7"  (1.702 m)   Wt 81.6 kg    SpO2 98%   BMI 28.19 kg/m  Physical Exam Vitals and nursing note reviewed.  Constitutional:      General: She is not in acute distress.    Appearance: She is well-developed.  HENT:     Head: Normocephalic and atraumatic.  Eyes:     Conjunctiva/sclera: Conjunctivae normal.  Cardiovascular:     Rate and Rhythm: Normal rate and regular rhythm.     Heart sounds: No murmur heard. Pulmonary:     Effort: Pulmonary effort is normal. No respiratory distress.     Breath sounds: Normal breath sounds.  Abdominal:     Palpations: Abdomen is soft.     Tenderness: There is no abdominal tenderness.  Musculoskeletal:        General: No swelling.     Cervical back: Neck supple.  Skin:    General: Skin is warm and dry.     Capillary Refill: Capillary refill takes less than 2 seconds.  Neurological:     Mental Status: She is alert.  Psychiatric:        Mood and Affect: Mood normal.     ED Results / Procedures / Treatments   Labs (all labs ordered are listed, but only abnormal results are displayed) Labs Reviewed  COMPREHENSIVE METABOLIC PANEL - Abnormal; Notable for the following components:      Result Value   CO2 19 (*)    Glucose, Bld 113 (*)    All other components within normal limits  TROPONIN I (HIGH SENSITIVITY) - Abnormal; Notable for the following components:   Troponin I (High Sensitivity) 33 (*)    All other components within normal limits  TROPONIN I (HIGH SENSITIVITY) - Abnormal; Notable for the following components:   Troponin I (High Sensitivity) 29 (*)    All other components within normal limits  CBC WITH DIFFERENTIAL/PLATELET    EKG EKG Interpretation Date/Time:  Sunday September 25 2023 12:50:34 EST Ventricular Rate:  71 PR Interval:  132 QRS Duration:  89 QT Interval:  373 QTC Calculation: 406 R Axis:   53  Text Interpretation: Sinus rhythm Abnormal R-wave progression, early transition Repol abnrm suggests ischemia, anterolateral similar to prior  Confirmed by Tanda Rockers ([id number]) on 09/25/2023 3:28:16 PM  Radiology DG Chest 2 View  Result Date: 09/25/2023 CLINICAL DATA:  Chest pain. EXAM: CHEST - 2 VIEW COMPARISON:  Radiographs 09/13/2022.  CT 09/13/2022. FINDINGS: The heart size and mediastinal contours are stable. There are coronary artery calcifications and probable stents. The lungs are clear. No pleural effusion or pneumothorax. No acute osseous findings are evident. There are mild degenerative changes  in the spine. Telemetry leads overlie the chest. IMPRESSION: No evidence of acute cardiopulmonary process. Coronary artery calcifications. Electronically Signed   By: Carey Bullocks M.D.   On: 09/25/2023 14:15    Procedures Procedures    Medications Ordered in ED Medications  aspirin chewable tablet 243 mg (243 mg Oral Given 09/25/23 1312)  nitroGLYCERIN (NITROSTAT) SL tablet 0.4 mg (0.4 mg Sublingual Given 09/25/23 1313)  nitroGLYCERIN (NITROSTAT) SL tablet 0.4 mg (0.4 mg Sublingual Given 09/25/23 1506)  sodium chloride 0.9 % bolus 500 mL (0 mLs Intravenous Stopped 09/25/23 1448)    ED Course/ Medical Decision Making/ A&P             HEART Score: 7                    Medical Decision Making Patient is a 72 year old female, history of coronary artery disease, 2 stents, who presents to the ED secondary to chest pain, at the middle of her breast, that has been tight, with associated dyspnea on exertion.  Chest pain woke her up from the middle of the night, will obtain troponins, chest x-ray, no neurosymptoms which is reassuring.  She does states she feels lightheaded, but not dizzy.  Will try aspirin, and nitroglycerin to see if relief  Amount and/or Complexity of Data Reviewed Labs: ordered.    Details: Troponin is downtrending 33-29 Radiology: ordered.    Details: Chest x-ray clear except for coronary artery calcifications ECG/medicine tests:  Decision-making details documented in ED Course. Discussion of management or test  interpretation with external provider(s): Discussed with Dr. Rennis Golden, her troponin is downtrending however she has a heart score of 7, and story is suspicious given the tightness in her chest, with relief with nitroglycerin.  She is still having chest pain, we will admit for observation, and he will be the admitter.  Will need further cardiac workup, thus admitted  Risk OTC drugs. Prescription drug management.    Final Clinical Impression(s) / ED Diagnoses Final diagnoses:  Chest pain, unspecified type    Rx / DC Orders ED Discharge Orders     None         Porsha Skilton, Harley [place], PA 09/25/23 1536    Anders Simmonds T, DO 09/25/23 Rickey Primus

## 2023-09-25 NOTE — ED Triage Notes (Signed)
She c/o central chest area "pressure" which awakened her a couple of times lact night and persists today. Her sin is normal, warm and dry and she is breathing normally.

## 2023-09-26 ENCOUNTER — Other Ambulatory Visit: Payer: Self-pay

## 2023-09-26 DIAGNOSIS — E039 Hypothyroidism, unspecified: Secondary | ICD-10-CM | POA: Diagnosis not present

## 2023-09-26 DIAGNOSIS — Z79899 Other long term (current) drug therapy: Secondary | ICD-10-CM | POA: Diagnosis not present

## 2023-09-26 DIAGNOSIS — Z955 Presence of coronary angioplasty implant and graft: Secondary | ICD-10-CM | POA: Diagnosis not present

## 2023-09-26 DIAGNOSIS — Z7984 Long term (current) use of oral hypoglycemic drugs: Secondary | ICD-10-CM | POA: Diagnosis not present

## 2023-09-26 DIAGNOSIS — R079 Chest pain, unspecified: Secondary | ICD-10-CM | POA: Diagnosis not present

## 2023-09-26 DIAGNOSIS — E119 Type 2 diabetes mellitus without complications: Secondary | ICD-10-CM | POA: Diagnosis not present

## 2023-09-26 DIAGNOSIS — Z7901 Long term (current) use of anticoagulants: Secondary | ICD-10-CM | POA: Diagnosis not present

## 2023-09-26 DIAGNOSIS — I480 Paroxysmal atrial fibrillation: Secondary | ICD-10-CM | POA: Diagnosis not present

## 2023-09-26 DIAGNOSIS — I209 Angina pectoris, unspecified: Secondary | ICD-10-CM | POA: Diagnosis not present

## 2023-09-26 DIAGNOSIS — I25119 Atherosclerotic heart disease of native coronary artery with unspecified angina pectoris: Secondary | ICD-10-CM | POA: Diagnosis not present

## 2023-09-26 DIAGNOSIS — Z7982 Long term (current) use of aspirin: Secondary | ICD-10-CM | POA: Diagnosis not present

## 2023-09-26 DIAGNOSIS — I10 Essential (primary) hypertension: Secondary | ICD-10-CM | POA: Diagnosis not present

## 2023-09-26 DIAGNOSIS — Z7985 Long-term (current) use of injectable non-insulin antidiabetic drugs: Secondary | ICD-10-CM | POA: Diagnosis not present

## 2023-09-26 LAB — CBG MONITORING, ED
Glucose-Capillary: 88 mg/dL (ref 70–99)
Glucose-Capillary: 113 mg/dL — ABNORMAL HIGH (ref 70–99)
Glucose-Capillary: 128 mg/dL — ABNORMAL HIGH (ref 70–99)
Glucose-Capillary: 118 mg/dL — ABNORMAL HIGH (ref 70–99)

## 2023-09-26 LAB — HEMOGLOBIN A1C
Hgb A1c MFr Bld: 5.2 % (ref 4.8–5.6)
Mean Plasma Glucose: 102.54 mg/dL

## 2023-09-26 MED ORDER — INSULIN ASPART 100 UNIT/ML IJ SOLN
0.0000 [IU] | Freq: Three times a day (TID) | INTRAMUSCULAR | Status: DC
  Administered 2023-09-27 (×2): 2 [IU] via SUBCUTANEOUS

## 2023-09-26 MED ORDER — ASPIRIN 81 MG PO TBEC
81.0000 mg | DELAYED_RELEASE_TABLET | Freq: Every day | ORAL | Status: DC
  Administered 2023-09-26 – 2023-09-27 (×2): 81 mg via ORAL
  Filled 2023-09-26 (×2): qty 1

## 2023-09-26 NOTE — Progress Notes (Signed)
   Discussed with nuclear medicine- due to staffing issues, unable to accommodate stress test today. Scheduled for tomorrow.   Discussed with patient- as her symptoms are atypical, trops minimally elevated and flat, and EKG is without changes, offered discharge today with outpatient stress test. Patient reports that her symptoms have been causing her anxiety, so she would prefer to stay for inpatient stress test tomorrow. This is reasonable.   Ordered Heart healthy/carb modified diet. NPO at midnight   Jonita Albee, PA-C 09/26/2023 9:43 AM

## 2023-09-26 NOTE — ED Notes (Signed)
PT son Theodoro Kalata called at Pt request to update with plan of care.

## 2023-09-26 NOTE — ED Notes (Signed)
Pt walked to restroom at this time.

## 2023-09-26 NOTE — ED Notes (Signed)
Pt arrived in room 26. No chest pain or discomfort. Vs stable. No further complaints.

## 2023-09-26 NOTE — H&P (Addendum)
Cardiology Admission History and Physical   Patient ID: [name] MRN: [id number]; DOB: Oct 29, 1950   Admission date: 09/25/2023  PCP:  Fatima Sanger, FNP   [facility] Providers Cardiologist:  Tessa Lerner, DO     Chief Complaint:  Chest Pain  Patient Profile:   [name] is a 72 y.o. female with a past medical history of coronary artery disease, hypertension, hyperlipidemia, hypothyroidism, GERD, type 2 diabetes, paroxysmal atrial flutter during stress test, depression, OSA on CPAP who is being seen 09/26/2023 for the evaluation of chest pain.  History of Present Illness:   [name] is a 72 year old female with above medical history who is followed by Dr. Odis Hollingshead.  Patient established care with Dr. Odis Hollingshead in 01/2020 for evaluation of abnormal EKG.  She underwent nuclear stress test on 03/16/2020 which showed a reversible defect in the distal anterior and apical regions.  EKG during stress test demonstrated atypical atrial flutter and she was started on oral AC. Echocardiogram on 02/22/2020 that showed EF 59%, moderate LVH, grade 2 diastolic dysfunction.  Underwent coronary CTA on 03/07/2020 that showed mild, nonobstructive CAD of the proximal and mid LAD and proximal left circumflex.  Noted that mid LAD stenosis may be more significant, study was sent for FFR.  FFR demonstrated flow-limiting stenosis of the proximal-mid LAD, just upstream of the D1 branch.  She underwent cardiac catheterization on 03/18/2020 and was found to have 75-80% stenosis in proximal LAD, 75-80% stenosis in the mid LAD, 20% stenosis proximal left circumflex, 20% stenosis proximal RCA.  Patient received a DES to the proximal LAD, DES to the mid LAD (not overlapping).   Patient's most recent echocardiogram from 05/19/2023 showed moderate concentric hypertrophy of the left ventricle, grade 1 diastolic dysfunction, EF 66%, mild mitral valve regurgitation, mild tricuspid valve regurgitation.  Patient was last  seen by Dr. Odis Hollingshead in clinic on 05/30/2023.  At that time, patient denied anginal chest pain or heart failure symptoms.  She remained on Toprol XL, Eliquis, aspirin, Zetia, Lipitor, Vascepa.  Patient presented to the ED at [facility] on 12/8 complaining of central chest pressure.  Reported that this pressure awakened her a couple of times overnight and persisted into the day.  Labs in the ED showed high-sensitivity troponin 33, 29.  NA 137, potassium 3.7, creatinine 0.91, WBC 9.3, hemoglobin 13.2, platelets 169.  Chest x-ray showed no evidence of acute cardiopulmonary process. EKG showed sinus rhythm, abnormal R wave progression (early transition), T wave inversions in leads I, aVL.  EKG unchanged compared to previous.  Patient was transferred to [facility] admitted to the cardiology service for further evaluation of chest pain.  On interview, patient reports that for the past 2 weeks or so, they have been having intermittent episodes of chest tightness. Chest tightness occurs randomly, but seems to mostly occur when she is at rest. Tightness sometimes occurs at night when she is sleeping. She is not very active in her day to day live, but she does some work about her house and denies chest pain on exertion. Thinks that her chest tightness may be associated with anxiety. She cannot recall what her symptoms were prior to getting her stents completed in 2021. Denies shortness of breath.  Denies syncope, near syncope.  She denies tobacco use. On medications for her cholesterol, BP, and diabetes.    Past Medical History:  Diagnosis Date   Arthritis    Coronary artery disease    Depression  Diabetes mellitus without complication (HCC)    GERD (gastroesophageal reflux disease)    Hypercholesteremia    Hypertension    Hypothyroidism    Pre-diabetes 08/19/2019   Sleep apnea    mild-modreate-tried cpap    Past Surgical History:  Procedure Laterality Date   CARPAL TUNNEL RELEASE     right    COLONOSCOPY     CORONARY ANGIOPLASTY WITH STENT PLACEMENT     CORONARY STENT INTERVENTION N/A 03/18/2020   Procedure: CORONARY STENT INTERVENTION;  Surgeon: Elder Negus, MD;  Location: [facility];  Service: Cardiovascular;  Laterality: N/A;   CORONARY ULTRASOUND/IVUS N/A 03/18/2020   Procedure: Intravascular Ultrasound/IVUS;  Surgeon: Elder Negus, MD;  Location: [facility];  Service: Cardiovascular;  Laterality: N/A;   DILATION AND CURETTAGE OF UTERUS     LEFT HEART CATH AND CORONARY ANGIOGRAPHY N/A 03/18/2020   Procedure: LEFT HEART CATH AND CORONARY ANGIOGRAPHY;  Surgeon: Elder Negus, MD;  Location: [facility];  Service: Cardiovascular;  Laterality: N/A;   ORIF ANKLE FRACTURE  04/13/2012   Procedure: OPEN REDUCTION INTERNAL FIXATION (ORIF) ANKLE FRACTURE;  Surgeon: Toni Arthurs, MD;  Location: [facility];  Service: Orthopedics;  Laterality: Left;   TONSILLECTOMY       Medications Prior to Admission: Prior to Admission medications   Medication Sig Start Date End Date Taking? Authorizing Provider  acetaminophen (TYLENOL) 500 MG tablet Take 500 mg by mouth every 6 (six) hours as needed for mild pain or headache.   Yes [provider]  apixaban (ELIQUIS) 5 MG TABS tablet Take 1 tablet (5 mg total) by mouth 2 (two) times daily. 02/13/21  Yes Tolia, Sunit, DO  aspirin EC 81 MG tablet Take 1 tablet (81 mg total) by mouth daily. Swallow whole. 07/27/21  Yes Tolia, Sunit, DO  atorvastatin (LIPITOR) 80 MG tablet Take 1 tablet by mouth once daily 07/25/23  Yes Tolia, Sunit, DO  Cholecalciferol 125 MCG (5000 UT) TABS Take 5,000 Units by mouth daily.   Yes [provider]  cyanocobalamin (VITAMIN B12) 1000 MCG tablet Take 1 tablet by mouth daily.   Yes [provider]  escitalopram (LEXAPRO) 20 MG tablet Take 20 mg by mouth daily.   Yes [provider]  ezetimibe (ZETIA) 10 MG tablet Take 1 tablet by mouth  once daily 07/12/23  Yes Tolia, Sunit, DO  FERREX 150 150 MG capsule Take 1 capsule by mouth once daily 08/29/23  Yes Kale, Corene Cornea, MD  irbesartan (AVAPRO) 300 MG tablet Take 300 mg by mouth daily.   Yes [provider]  levothyroxine (SYNTHROID) 88 MCG tablet Take 88 mcg by mouth daily before breakfast.   Yes [provider]  memantine (NAMENDA) 10 MG tablet Take 10 mg by mouth 2 (two) times daily. 12/07/22  Yes [provider]  metFORMIN (GLUCOPHAGE-XR) 500 MG 24 hr tablet Take 1,000 mg by mouth daily. 08/09/20  Yes [provider]  metoprolol succinate (TOPROL XL) 50 MG 24 hr tablet Take 1 tablet (50 mg total) by mouth in the morning. Hold if systolic blood pressure (top blood pressure number) less than 100 mmHg or heart rate less than 60 bpm (pulse). 06/24/23  Yes Tolia, Sunit, DO  pantoprazole (PROTONIX) 40 MG tablet Take 1 tablet by mouth once daily 07/25/23  Yes Tolia, Sunit, DO  Semaglutide,0.25 or 0.5MG /DOS, (OZEMPIC, 0.25 OR 0.5 MG/DOSE,) 2 MG/3ML SOPN Inject 0.5 mg into the skin every Monday.  Yes [provider]  VASCEPA 1 g capsule Take 2 capsules by mouth twice daily 07/12/23  Yes Tolia, Sunit, DO  levothyroxine (SYNTHROID, LEVOTHROID) 100 MCG tablet Take 100 mcg by mouth at bedtime. Patient not taking: Reported on 09/26/2023    [provider]  nitroGLYCERIN (NITROSTAT) 0.4 MG SL tablet Place 1 tablet (0.4 mg total) under the tongue every 5 (five) minutes as needed for chest pain. If you require more than two tablets five minutes apart go to the nearest ER via EMS. Patient taking differently: Place 0.4 mg under the tongue every 5 (five) minutes as needed for chest pain (PRN only). If you require more than two tablets five minutes apart go to the nearest ER via EMS. 06/26/20 05/30/23  Tessa Lerner, DO     Allergies:    Allergies  Allergen Reactions   Benazepril Cough   Ciprofloxacin Itching   Naproxen Sodium Other (See Comments)     Stomach burns     Social History:   Social History   Socioeconomic History   Marital status: Divorced    Spouse name: Not on file   Number of children: 1   Years of education: 12   Highest education level: Not on file  Occupational History   Not on file  Tobacco Use   Smoking status: Never   Smokeless tobacco: Never  Vaping Use   Vaping status: Never Used  Substance and Sexual Activity   Alcohol use: Not Currently   Drug use: No   Sexual activity: Not on file  Other Topics Concern   Not on file  Social History Narrative   Not on file   Social Determinants of Health   Financial Resource Strain: Not on file  Food Insecurity: Not on file  Transportation Needs: Not on file  Physical Activity: Not on file  Stress: Not on file  Social Connections: Not on file  Intimate Partner Violence: Not on file    Family History:   The patient's family history includes Breast cancer in her sister; Colon cancer in her father; Dementia in her mother; Heart attack in her father; Hypertension in her sister, sister, sister, and sister.    ROS:  Please see the history of present illness.  All other ROS reviewed and negative.     Physical Exam/Data:   Vitals:   09/26/23 0130 09/26/23 0400 09/26/23 0615 09/26/23 0800  BP: (!) 128/59 126/60 (!) 141/65 (!) 146/74  Pulse: (!) 57 61 63 64  Resp: 14 14 14 20   Temp:  98.2 F (36.8 C) 98.1 F (36.7 C)   TempSrc:  Oral    SpO2: 99% 99% 98% 98%  Weight:      Height:        Intake/Output Summary (Last 24 hours) at 09/26/2023 0849 Last data filed at 09/25/2023 1448 Gross per 24 hour  Intake 500.51 ml  Output --  Net 500.51 ml      09/25/2023    1:35 PM 05/30/2023    9:43 AM 03/11/2023   11:04 AM  Last 3 Weights  Weight (lbs) 180 lb 200 lb 202 lb 11.2 oz  Weight (kg) 81.647 kg 90.719 kg 91.944 kg     Body mass index is 28.19 kg/m.  General:  Well nourished, well developed, in no acute distress. Sitting comfortably in the bed   HEENT: normal Neck: no JVD Vascular: Radial pulses 2+ bilaterally   Cardiac:  normal S1, S2; RRR; no murmurs  Lungs:  clear to  auscultation bilaterally, no wheezing, rhonchi or rales. Normal work of breathing on room air  Abd: soft, nontender  Ext: no edema in BLE Musculoskeletal:  No deformities, BUE and BLE strength normal and equal Skin: warm and dry  Neuro:  CNs 2-12 intact, no focal abnormalities noted Psych:  Normal affect    EKG:  The ECG that was done 12/8 was personally reviewed and demonstrates sinus rhythm, abnormal R wave progression (early transition), T wave inversions in leads I, aVL.  EKG unchanged compared to previous.  Relevant CV Studies: Cardiac Studies & Procedures   CARDIAC CATHETERIZATION  CARDIAC CATHETERIZATION 03/18/2020  Narrative LM: Normal LAD: Prox 75-80% stenosis. Mild calcification. Mid 75-80% stenosis. Mild calcification. Prox D1 50% stenosis. LCx: Prox 20% stenosis. RCA: Prox 20% stenosis.  PTCA and stent placement 2.5 X 12 mm Resolute Onyx drug-eluting stent mid LAD PTCA and stent placement 3.5 X 18 mm Resolute Onyx drug-eluting stent prox LAD  Elder Negus, MD [facility] : [facility] [place] Pager: [phone] Office: [phone]  Findings Coronary Findings Diagnostic  Dominance: Right  Left Anterior Descending Prox LAD lesion is 75% stenosed. The lesion is mildly calcified. CTA FFR 0.60 Mid LAD lesion is 75% stenosed. The lesion is mildly calcified. CTA FFR 0.60  First Diagonal Branch 1st Diag lesion is 50% stenosed.  Left Circumflex Prox Cx lesion is 20% stenosed.  Right Coronary Artery Prox RCA lesion is 20% stenosed.  Intervention  Prox LAD lesion Stent Lesion length:  120 mm. CATH LAUNCHER 6FR EBU 3 guide catheter was inserted. Lesion crossed with guidewire using a WIRE COUGAR XT STRL 190CM. Pre-stent angioplasty was performed using a BALLOON WOLVERINE 3.50X10. Maximum pressure:  10 atm. Inflation time:  20 sec. A  drug-eluting stent was successfully placed using a STENT RESOLUTE ONYX 3.5X15. Maximum pressure: 12 atm. Inflation time: 20 sec. Stent strut is well apposed. Stent does not overlap previously placed stentPost-stent angioplasty was performed using a BALLOON [place] EMERGE MR 4.0X12. Maximum pressure:  22 atm. Inflation time:  30 sec. There is significant mismatch between reference vessel sizes of mid and prox LAD lesions, precluding use of one long stent, as it would be oversized distally, and undersized proximally. Thus, I placed one mid and one proximal stent. Diagonal 1 &amp; 2 were wired and protected, but did not require angioplasty or intervention. Post-Intervention Lesion Assessment The intervention was successful. Pre-interventional TIMI flow is 3. Post-intervention TIMI flow is 3. No complications occurred at this lesion. Ultrasound (IVUS) was performed on the lesion post PCI. There is a 0% residual stenosis post intervention.  Mid LAD lesion Stent Lesion length:  10 mm. Lesion crossed with guidewire using a WIRE ASAHI PROWATER 180CM. Pre-stent angioplasty was performed using a BALLOON WOLVERINE 2.50X6. A drug-eluting stent was successfully placed using a STENT RESOLUTE ONYX 2.5X12. Maximum pressure: 14 atm. Inflation time: 40 sec. Stent strut is well apposed. Post-stent angioplasty was performed using a BALLOON [place] EMERGE MR 3.0X8. Maximum pressure:  16 atm. Inflation time:  30 sec. Post-Intervention Lesion Assessment The intervention was successful. Pre-interventional TIMI flow is 3. Post-intervention TIMI flow is 3. No complications occurred at this lesion. There is a 0% residual stenosis post intervention.     ECHOCARDIOGRAM  PCV ECHOCARDIOGRAM COMPLETE 05/19/2023  Narrative Echocardiogram 05/19/2023: Left ventricle cavity is normal in size. Moderate concentric hypertrophy of the left ventricle. Normal global wall motion. Normal LV systolic function with EF 66%. Doppler evidence of grade I  (impaired) diastolic dysfunction, normal LAP. Left atrial cavity is  mildly dilated. Mild (Grade I) mitral regurgitation. Mild tricuspid regurgitation. Small anterior pericardial effusion. No hemodynamic significance. No evidence of pulmonary hypertension. Unlike previous study in 2021, intracavitary gradient and concern for apical hypertrophic cardiomyopathy not well appreciated.     CT SCANS  CT CORONARY MORPH W/CTA COR W/SCORE 03/07/2020  Addendum 03/08/2020 11:18 AM ADDENDUM REPORT: 03/08/2020 11:16  HISTORY: 72 yo female with dyspnea on exertion (DOE) abnormal nuclear stress test  EXAM: Cardiac/Coronary CTA  TECHNIQUE: The patient was scanned on a [employer].  PROTOCOL: A 120 kV prospective scan was triggered in the descending thoracic aorta at 111 HU's. Axial non-contrast 3 mm slices were carried out through the heart. The data set was analyzed on a dedicated work station and scored using the Agatson method. Gantry rotation speed was 250 msecs and collimation was .6 mm. Beta blockade and 0.8 mg of sl NTG was given. The 3D data set was reconstructed in 5% intervals of the 67-82 % of the R-R cycle. Diastolic phases were analyzed on a dedicated work station using MPR, MIP and VRT modes. The patient received 80mL OMNIPAQUE IOHEXOL 350 MG/ML SOLN of contrast.  FINDINGS: Quality: Good (HR 72)  Coronary calcium score: The patient's coronary artery calcium score is 148, which places the patient in the 82nd percentile.  Coronary arteries: Normal coronary origins.  Right dominance.  Right Coronary Artery: Dominant. Minimal mixed proximal and mid-RCA 1-24% stenosis (CADRADS1). No significant PLB or PDA disease.  Left Main Coronary Artery: Normal. Bifurcates into the LAD and LCx arteries.  Left Anterior Descending Coronary Artery: Moderate sized vessel which is does not reach the apex. There is mild 25-49% mixed proximal stenosis at the 1st septal perforator  (CADRADS2). There is another mild to moderate mixed stenosis 25-49% at the D2 bifurcation with significant blooming/alignment artifact (this is a 2.25 vessel). Small <2 mm proximal D1 branch with minimal 1-24% ostial mixed stenosis (CADRADS1). Larger tortuous mid-vessel D2 branch without disease.  Left Circumflex Artery: Mild proximal 25-49% mixed stenosis. Large caliber proximal OM1 branch without disease. Smaller OM2 branch without disease.  Aorta: Normal size, 31 mm at the mid ascending aorta (level of the PA bifurcation) measured double oblique. Aortic atherosclerosis. No dissection.  Aortic Valve: Trileaflet.  No calcifications.  Other findings:  Normal pulmonary vein drainage into the left atrium.  Normal left atrial appendage without a thrombus.  Dilated main pulmonary artery to 37 mm, suggesting pulmonary hypertension.  IMPRESSION: 1. Mild non-obstructive CAD of the proximal and mid-LAD and proximal LCx, CADRADS = 2. The mid-LAD stenosis may be more significant, however, there is blooming and alignment artifact. Will submit study for FFR, given the abnormal myoview results in the distal anterior and apical regions.  2. Coronary calcium score of 148. This was 82nd percentile for age and sex matched control.  3. Normal coronary origin with right dominance.  4. Dilated main pulmonary artery to 37 mm, suggesting pulmonary hypertension.  5.  Aortic atherosclerosis.   Electronically Signed By: Chrystie Nose M.D. On: 03/08/2020 11:16  Narrative EXAM: OVER-READ INTERPRETATION  CT CHEST  The following report is an over-read performed by radiologist Dr. Jeronimo Greaves of [facility] on 03/07/2020. This over-read does not include interpretation of cardiac or coronary anatomy or pathology. The coronary CTA interpretation by the cardiologist is attached.  COMPARISON:  None.  FINDINGS: Vascular: Aortic atherosclerosis. Pulmonary artery  enlargement, outflow tract 3.4 cm. No central pulmonary embolism, on this non-dedicated study.  Mediastinum/Nodes: No mediastinal or  hilar adenopathy. Tiny hiatal hernia. Fluid level in the esophagus including on 19/12.  Lungs/Pleura: No pleural fluid.  Clear imaged lungs.  Upper Abdomen: Normal imaged portions of the liver, spleen.  Musculoskeletal: No acute osseous abnormality.  IMPRESSION: 1.  No acute findings in the imaged extracardiac chest. 2.  Aortic Atherosclerosis (ICD10-I70.0). 3. Pulmonary artery enlargement suggests pulmonary arterial hypertension. 4. Tiny hiatal hernia. Esophageal air fluid level suggests dysmotility or gastroesophageal reflux.  Electronically Signed: By: Jeronimo Greaves M.D. On: 03/07/2020 14:50           Laboratory Data:  High Sensitivity Troponin:   Recent Labs  Lab 09/25/23 1251 09/25/23 1439  TROPONINIHS 33* 29*      Chemistry Recent Labs  Lab 09/25/23 1254  NA 137  K 3.7  CL 105  CO2 19*  GLUCOSE 113*  BUN 13  CREATININE 0.91  CALCIUM 9.8  GFRNONAA >60  ANIONGAP 13    Recent Labs  Lab 09/25/23 1254  PROT 7.3  ALBUMIN 4.4  AST 16  ALT 10  ALKPHOS 66  BILITOT 0.8   Lipids No results for input(s): "CHOL", "TRIG", "HDL", "LABVLDL", "LDLCALC", "CHOLHDL" in the last 168 hours. Hematology Recent Labs  Lab 09/25/23 1254  WBC 9.3  RBC 4.00  HGB 13.2  HCT 37.6  MCV 94.0  MCH 33.0  MCHC 35.1  RDW 13.1  PLT 169   Thyroid No results for input(s): "TSH", "FREET4" in the last 168 hours. BNPNo results for input(s): "BNP", "PROBNP" in the last 168 hours.  DDimer No results for input(s): "DDIMER" in the last 168 hours.   Radiology/Studies:  DG Chest 2 View  Result Date: 09/25/2023 CLINICAL DATA:  Chest pain. EXAM: CHEST - 2 VIEW COMPARISON:  Radiographs 09/13/2022.  CT 09/13/2022. FINDINGS: The heart size and mediastinal contours are stable. There are coronary artery calcifications and probable stents. The lungs are  clear. No pleural effusion or pneumothorax. No acute osseous findings are evident. There are mild degenerative changes in the spine. Telemetry leads overlie the chest. IMPRESSION: No evidence of acute cardiopulmonary process. Coronary artery calcifications. Electronically Signed   By: Carey Bullocks M.D.   On: 09/25/2023 14:15     Assessment and Plan:   Chest Pain  CAD - Patient previously had DES to mid LAD, proximal LAD in 2021 -Now, patient presents complaining of intermittent chest tightness that has been going on for the past 2 weeks.  Chest tightness occurs mostly when patient is at rest.  She denies chest tightness on exertion.  Patient wonders if her chest tightness may be a symptom of her anxiety -EKG this admission unchanged -High-sensitivity troponin 33, 29 -Recent echocardiogram from 05/19/2023 showed EF 66%, normal wall motion, moderate LVH, grade 1 DD -Patient's chest pain is atypical, EKG unchanged, troponins flat, minimally elevated.  With her history of CAD/stenting, ordered nuclear stress test to rule out ischemia, though I have low suspicion.  Patient is a treadmill candidate - Continue lipitor, zetia, metoprolol, aspirin  - Note- patient develops severe headaches when given nitroglycerin. Likely would not tolerate imdur. Cannot increase BB due to HR in the 50s-60s. Consider adding amlodipine pending stress test results  HLD  - Lipid pane from 07/2021 showed LDL 59  - Continue lipitor 80 mg daily, zetia 10 mg daily   HTN - Continue metoprolol succinate 50 mg daily, irbesartan 300 mg daily   Diabetes  - Home metformin held in case patient needs cath this admission  - SSI while admitted  Paroxysmal Atrial Flutter  - Patient developed atrial flutter during stress test in 2021. Denies palpitations. No known recurrences since stress test  - On eliquis at home. Last dose yesterday evening - Hold eliquis in case patient needs cath - Maintaining NSR per telemetry  - If she  has recurrent atrial flutter while on tele, could start heparin   Informed Consent   Shared Decision Making/Informed Consent The risks [chest pain, shortness of breath, cardiac arrhythmias, dizziness, blood pressure fluctuations, myocardial infarction, stroke/transient ischemic attack, nausea, vomiting, allergic reaction, radiation exposure, metallic taste sensation and life-threatening complications (estimated to be 1 in 10,000)], benefits (risk stratification, diagnosing coronary artery disease, treatment guidance) and alternatives of a nuclear stress test were discussed in detail with Ms. Budzinski and she agrees to proceed.      Risk Assessment/Risk Scores:      CHA2DS2-VASc Score = 5   This indicates a 7.2% annual risk of stroke. The patient's score is based upon: CHF History: 0 HTN History: 1 Diabetes History: 1 Stroke History: 0 Vascular Disease History: 1 Age Score: 1 Gender Score: 1   Code Status: Full Code  Severity of Illness: The appropriate patient status for this patient is OBSERVATION. Observation status is judged to be reasonable and necessary in order to provide the required intensity of service to ensure the patient's safety. The patient's presenting symptoms, physical exam findings, and initial radiographic and laboratory data in the context of their medical condition is felt to place them at decreased risk for further clinical deterioration. Furthermore, it is anticipated that the patient will be medically stable for discharge from the hospital within 2 midnights of admission.    For questions or updates, please contact [facility] Please consult [email or web address] for contact info under     Signed, Jonita Albee, PA-C  09/26/2023 8:49 AM

## 2023-09-26 NOTE — ED Notes (Signed)
No caffeine aftere 1800 today and NPO at midnight. Stress test to be done tomorrow.

## 2023-09-26 NOTE — ED Notes (Signed)
Patient denies pain and is resting comfortably. VSS NAD PT on room air.

## 2023-09-26 NOTE — ED Notes (Signed)
PT report sent via secure chat to Darryll Capers RN.

## 2023-09-27 ENCOUNTER — Observation Stay (HOSPITAL_COMMUNITY): Payer: Medicare HMO

## 2023-09-27 DIAGNOSIS — E785 Hyperlipidemia, unspecified: Secondary | ICD-10-CM | POA: Insufficient documentation

## 2023-09-27 DIAGNOSIS — I253 Aneurysm of heart: Secondary | ICD-10-CM | POA: Diagnosis not present

## 2023-09-27 DIAGNOSIS — E118 Type 2 diabetes mellitus with unspecified complications: Secondary | ICD-10-CM | POA: Insufficient documentation

## 2023-09-27 DIAGNOSIS — I209 Angina pectoris, unspecified: Secondary | ICD-10-CM | POA: Diagnosis not present

## 2023-09-27 DIAGNOSIS — I10 Essential (primary) hypertension: Secondary | ICD-10-CM | POA: Insufficient documentation

## 2023-09-27 DIAGNOSIS — I2089 Other forms of angina pectoris: Secondary | ICD-10-CM | POA: Diagnosis not present

## 2023-09-27 DIAGNOSIS — I480 Paroxysmal atrial fibrillation: Secondary | ICD-10-CM | POA: Insufficient documentation

## 2023-09-27 LAB — CBC
HCT: 35.8 % — ABNORMAL LOW (ref 36.0–46.0)
Hemoglobin: 12 g/dL (ref 12.0–15.0)
MCH: 32.5 pg (ref 26.0–34.0)
MCHC: 33.5 g/dL (ref 30.0–36.0)
MCV: 97 fL (ref 80.0–100.0)
Platelets: 154 10*3/uL (ref 150–400)
RBC: 3.69 MIL/uL — ABNORMAL LOW (ref 3.87–5.11)
RDW: 13.1 % (ref 11.5–15.5)
WBC: 6.7 10*3/uL (ref 4.0–10.5)
nRBC: 0 % (ref 0.0–0.2)

## 2023-09-27 LAB — LIPID PANEL
Cholesterol: 131 mg/dL (ref 0–200)
HDL: 57 mg/dL (ref >40)
LDL Cholesterol: 57 mg/dL (ref 0–99)
Total CHOL/HDL Ratio: 2.3 {ratio}
Triglycerides: 85 mg/dL (ref <150)
VLDL: 17 mg/dL (ref 0–40)

## 2023-09-27 LAB — BASIC METABOLIC PANEL
Anion gap: 8 (ref 5–15)
BUN: 13 mg/dL (ref 8–23)
CO2: 21 mmol/L — ABNORMAL LOW (ref 22–32)
Calcium: 9.3 mg/dL (ref 8.9–10.3)
Chloride: 106 mmol/L (ref 98–111)
Creatinine, Ser: 1 mg/dL (ref 0.44–1.00)
GFR, Estimated: >60 mL/min (ref >60)
Glucose, Bld: 98 mg/dL (ref 70–99)
Potassium: 3.9 mmol/L (ref 3.5–5.1)
Sodium: 135 mmol/L (ref 135–145)

## 2023-09-27 LAB — NM MYOCAR MULTI W/SPECT W/WALL MOTION / EF
Estimated workload: 0
Exercise duration (min): 6 min
Exercise duration (sec): 65 s
MPHR: 150 {beats}/min
Peak HR: 126 {beats}/min
Percent HR: 84 %
Rest HR: 62 {beats}/min

## 2023-09-27 LAB — CBG MONITORING, ED
Glucose-Capillary: 106 mg/dL — ABNORMAL HIGH (ref 70–99)
Glucose-Capillary: 144 mg/dL — ABNORMAL HIGH (ref 70–99)

## 2023-09-27 LAB — MRSA NEXT GEN BY PCR, NASAL: MRSA by PCR Next Gen: DETECTED — AB

## 2023-09-27 MED ORDER — TECHNETIUM TC 99M TETROFOSMIN IV KIT
10.4000 | PACK | Freq: Once | INTRAVENOUS | Status: AC | PRN
  Administered 2023-09-27: 10.4 via INTRAVENOUS

## 2023-09-27 MED ORDER — TECHNETIUM TC 99M TETROFOSMIN IV KIT
32.0000 | PACK | Freq: Once | INTRAVENOUS | Status: AC | PRN
  Administered 2023-09-27: 32 via INTRAVENOUS

## 2023-09-27 MED ORDER — REGADENOSON 0.4 MG/5ML IV SOLN
0.4000 mg | Freq: Once | INTRAVENOUS | Status: AC
  Administered 2023-09-27: 0.4 mg via INTRAVENOUS

## 2023-09-27 MED ORDER — REGADENOSON 0.4 MG/5ML IV SOLN
INTRAVENOUS | Status: AC
  Filled 2023-09-27: qty 5

## 2023-09-27 MED ORDER — NITROGLYCERIN 0.4 MG SL SUBL
SUBLINGUAL_TABLET | SUBLINGUAL | Status: AC
Start: 2023-09-27 — End: ?
  Filled 2023-09-27: qty 1

## 2023-09-27 MED ORDER — ISOSORBIDE MONONITRATE ER 30 MG PO TB24: 15.0000 mg | ORAL_TABLET | Freq: Every day | ORAL | 2 refills | Status: AC

## 2023-09-27 NOTE — ED Notes (Signed)
Mentions HA, pt to nuclear medicine, alert, NAD, calm, interactive, agreeable.

## 2023-09-27 NOTE — ED Notes (Signed)
Not in room, remains in stress test

## 2023-09-27 NOTE — Discharge Summary (Incomplete)
Discharge Summary    Patient ID: [name] MRN: [id number]; DOB: 1951-04-03  Admit date: 09/25/2023 Discharge date: 09/27/2023  PCP:  Fatima Sanger, FNP   [facility] Providers Cardiologist:  Tessa Lerner, DO        Discharge Diagnoses    Principal Problem:   Angina pectoris [facility])    Diagnostic Studies/Procedures    MYOVIEW:  09/27/2023 FINDINGS: Perfusion: Focal areas of decreased uptake along the apex and some involvement of the extreme distal anterior and inferior walls. Otherwise near symmetric distribution radiotracer comparing stress and rest.   Wall Motion: There is some dyskinesis of the apex. Known ventricular aneurysm.   Left Ventricular Ejection Fraction: 45 %   End diastolic volume 111 ml   End systolic volume 61 ml   IMPRESSION: 1. 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   2. No reversible changes identified.   3. Left ventricular ejection fraction 45%   4. Non invasive risk stratification*: Intermediate   _____________   History of Present Illness     [name] is a 72 y.o. female with a past medical history of coronary artery disease w/ DES x 2 LAD 2021, hypertension, hyperlipidemia, hypothyroidism, GERD, type 2 diabetes, paroxysmal atrial flutter during stress test, depression, and OSA on CPAP.  She was admitted 12/09 with chest pain, Cards admitted.    Hospital Course     Consultants: none   She had some mild elevation in her troponin, so a nuclear stress test was performed 12/10.   Results show no ischemia, but her EF is listed at 45%.         Did the patient have an acute coronary syndrome (MI, NSTEMI, STEMI, etc) this admission?:  No                               Did the patient have a percutaneous coronary intervention (stent / angioplasty)?:  No.    _____________  Discharge Vitals Blood pressure 113/66, pulse 75, temperature 98 F (36.7 C), temperature  source Oral, resp. rate 16, height 5' 6.5" (1.689 m), weight 82.9 kg, SpO2 98%.  Filed Weights   09/25/23 1335 09/27/23 1257  Weight: 81.6 kg 82.9 kg    Labs & Radiologic Studies    CBC Recent Labs    09/25/23 1254 09/27/23 0548  WBC 9.3 6.7  NEUTROABS 6.1  --   HGB 13.2 12.0  HCT 37.6 35.8*  MCV 94.0 97.0  PLT 169 154   Basic Metabolic Panel Recent Labs    62/13/08 1254 09/27/23 0548  NA 137 135  K 3.7 3.9  CL 105 106  CO2 19* 21*  GLUCOSE 113* 98  BUN 13 13  CREATININE 0.91 1.00  CALCIUM 9.8 9.3   Liver Function Tests Recent Labs    09/25/23 1254  AST 16  ALT 10  ALKPHOS 66  BILITOT 0.8  PROT 7.3  ALBUMIN 4.4   No results for input(s): "LIPASE", "AMYLASE" in the last 72 hours. High Sensitivity Troponin:   Recent Labs  Lab 09/25/23 1251 09/25/23 1439  TROPONINIHS 33* 29*    BNP Invalid input(s): "POCBNP" D-Dimer No results for input(s): "DDIMER" in the last 72 hours. Hemoglobin A1C Recent Labs    09/26/23 0930  HGBA1C 5.2   Fasting Lipid Panel Recent Labs    09/27/23 0548  CHOL 131  HDL 57  LDLCALC  57  TRIG 85  CHOLHDL 2.3   Thyroid Function Tests No results for input(s): "TSH", "T4TOTAL", "T3FREE", "THYROIDAB" in the last 72 hours.  Invalid input(s): "FREET3" _____________  NM Myocar Multi W/Spect W/Wall Motion / EF  Result Date: 09/27/2023 CLINICAL DATA:  Chest pain anginal equivalent EXAM: MYOCARDIAL IMAGING WITH SPECT (REST AND PHARMACOLOGIC-STRESS) GATED LEFT VENTRICULAR WALL MOTION STUDY LEFT VENTRICULAR EJECTION FRACTION TECHNIQUE: Standard myocardial SPECT imaging was performed after resting intravenous injection of 10 mCi Tc-48m Myoview. Subsequently, intravenous infusion of Lexiscan was performed under the supervision of the Cardiology staff. At peak effect of the drug, 32 mCi Tc-1m Myoview was injected intravenously and standard myocardial SPECT imaging was performed. Quantitative gated imaging was also performed to  evaluate left ventricular wall motion, and estimate left ventricular ejection fraction. COMPARISON:  CT 09/13/2022 FINDINGS: Perfusion: Focal areas of decreased uptake along the apex and some involvement of the extreme distal anterior and inferior walls. Otherwise near symmetric distribution radiotracer comparing stress and rest. Wall Motion: There is some dyskinesis of the apex. Known ventricular aneurysm. Left Ventricular Ejection Fraction: 45 % End diastolic volume 111 ml End systolic volume 61 ml IMPRESSION: 1. 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 2. No reversible changes identified. 3. Left ventricular ejection fraction 45% 4. Non invasive risk stratification*: Intermediate *2012 Appropriate Use Criteria for Coronary Revascularization Focused Update: J Am Coll Cardiol. 2012;59(9):857-881. http://content.dementiazones.com.aspx?articleid=[email or web address] Electronically Signed   By: Karen Kays M.D.   On: 09/27/2023 15:11   DG Chest 2 View  Result Date: 09/25/2023 CLINICAL DATA:  Chest pain. EXAM: CHEST - 2 VIEW COMPARISON:  Radiographs 09/13/2022.  CT 09/13/2022. FINDINGS: The heart size and mediastinal contours are stable. There are coronary artery calcifications and probable stents. The lungs are clear. No pleural effusion or pneumothorax. No acute osseous findings are evident. There are mild degenerative changes in the spine. Telemetry leads overlie the chest. IMPRESSION: No evidence of acute cardiopulmonary process. Coronary artery calcifications. Electronically Signed   By: Carey Bullocks M.D.   On: 09/25/2023 14:15   Disposition   Pt is being discharged home today in good condition.  Follow-up Plans & Appointments        Discharge Medications   Allergies as of 09/27/2023       Reactions   Benazepril Cough   Ciprofloxacin Itching   Naproxen Sodium Other (See Comments)   Stomach burns     Med Rec must be completed prior to using this  SMARTLINK***          Outstanding Labs/Studies   None  Duration of Discharge Encounter   Greater than 30 minutes including physician time.  Signed, Theodore Demark, PA-C 09/27/2023, 3:54 PM

## 2023-09-27 NOTE — ED Notes (Signed)
Radiology has called for pt, NPO since MN, pending imminent stress test

## 2023-09-27 NOTE — Progress Notes (Signed)
     [name] presented for a lexiscan nuclear stress test today.  No immediate complications.  Stress imaging is pending at this time.  Preliminary EKG findings may be listed in the chart, but the stress test result will not be finalized until perfusion imaging is complete.  Prior to testing patient complained of tinnitus/headache. Pre testing EKG showed inferior ST elevation that seemed more pronounced with lateral T wave depression in aVL, but similar to prior EKGs. Reviewed with Dr. Wyline Mood, okay to proceed with stress testing.   Abagail Kitchens, PA-C  09/27/2023, 11:08 AM

## 2023-09-27 NOTE — Care Management Obs Status (Signed)
MEDICARE OBSERVATION STATUS NOTIFICATION   Patient Details  Name: [name] MRN: [id number] Date of Birth: 1951/02/21   Medicare Observation Status Notification Given:  Yes    Oletta Cohn, RN 09/27/2023, 11:56 AM

## 2023-09-27 NOTE — Discharge Summary (Addendum)
Discharge Summary    Patient ID: [name] MRN: [id number]; DOB: May 16, 1951  Admit date: 09/25/2023 Discharge date: 09/27/2023  PCP:  Fatima Sanger, FNP   [facility] Providers Cardiologist:  Tessa Lerner, DO   {  Discharge Diagnoses    Principal Problem:   Angina pectoris [facility]) Active Problems:   CAD (coronary artery disease)   PAF (paroxysmal atrial fibrillation) (HCC)   HTN (hypertension)   HLD (hyperlipidemia)   Type 2 diabetes mellitus with complication, without long-term current use of insulin (HCC)    Diagnostic Studies/Procedures    Myoview 09/27/2023: 1. 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   2. No reversible changes identified.   3. Left ventricular ejection fraction 45%   4. Non invasive risk stratification*: Intermediate _____________   History of Present Illness     [name] is a 72 y.o. female with a history of CAD s/p DES to proximal LAD and DES to mid LAD (not overlapping) in 03/2020, paroxysmal atrial flutter on Eliquis,  hypertension, hyperlipidemia, type 2 diabetes mellitus, hypothyroidism, and GERD who was admitted on 09/25/2023 for further evaluation of chest pain.  She has a history of CAD with abnormal nuclear stress test in 02/2020 which showed a reversible defect in the distal anterior and apical regions.  She was noted to be in new onset atypical atrial flutter during the stress test was started on anticoagulation.  Echo in 02/2020 showed LVEF of 59% with moderate LVH and grade 2 diastolic dysfunction.  She underwent coronary CTA in 02/2020 which showed mild nonobstructive CAD of the proximal mid LAD and proximal left circumflex.  However, FFR revealed a flow-limiting stenosis of the proximal to mid LAD upstream of the D1 branch.  Therefore, she underwent cardiac catheterization in 03/2020 which showed 75-80% stenosis of proximal LAD, 75-80% stenosis of mid LAD, 20% stenosis of  proximal LCx, and 20% stenosis of proximal RCA.  She underwent DES to proximal LAD and DES to mid LAD (not overlapping).  Most recent echo in 05/2023 showed EF of 66% with moderate concentric LVH and grade 1 diastolic dysfunction as well as mild MR and mild TR.  She was seen by Dr. Odis Hollingshead later that month at which time she was doing well with no chest pain or CHF symptoms.  [facility] for for central chest pressure that awakened her a couple of times overnight and persisted into the day.  EKG showed normal sinus rhythm with abnormal R wave progression and T wave inversions in leads I and aVL but there were no acute changes compared to prior tracings.  High-sensitivity troponin was minimally elevated and flat at 33 >>29. WBC 9.3, Hgb 13.2, Plts 169. Na 137, K 3.7, Cr 0.91.  Chest x-ray showed no acute findings.  She was transferred to [facility] under cardiology service for further evaluation.  On arrival to [facility], patient reported intermittent chest tightness for the past 2 weeks. This occurs randomly but seems to be mostly at rest.  She is not very active in her day-to-day life but she able to do work around her house without any chest pain.  She thought her chest pain may be related related to anxiety is unable to recall what her symptoms were prior to her previous PCI in 2021.  She denies any shortness of breath or near syncope/syncope.   Hospital Course     Consultants:  None  Chest Pain CAD status post DES to mid and proximal LAD in 2021  She presented with some nonexertional chest pain.  EKG showed chronic inferolateral ST elevation with no significant changes from prior.  High-sensitivity troponin minimally elevated and flat 33-29 not consistent with ACS. Recent Echo in 05/2023 showed LVEF of 66% with normal wall motion. Myoview was ordered and showed. Suspect some of her symptoms may be attributed to anxiety, reports not taking her SSRI as  prescribed. Stop aspirin. Continue atorvastatin 80 mg, Toprol-XL 50 mg, Zetia.  Discharge on Imdur 15 mg.  Previously did not tolerate nitroglycerin due to severe headaches. Deferred to outpatient setting if to repeat echocardiogram, Lexi scan may be inaccurate though.   Paroxysmal Atrial Flutter Noted during stress test in 2021. No known recurrence since that time. Maintaining sinus rhythm this admission. Continue Toprol-XL 50mg  daily. Eliquis was held in case cardiac catheterization was need. This can be resumed at discharge.  Hypertension BP markedly elevated during Myoview. However, otherwise, well controlled. Continue Toprol-XL 50mg  daily and Irbesartan 300mg  daily.  Hyperlipidemia Lipid panel this admission showed Total Cholesterol 131, Triglycerides 85, HDL 57, LDL 57. LDL goal <70 given CAD.  Continue Lipitor 80mg  daily, Zetia 10mg  daily, and Vascepa 2g twice daily.  Type 2 Diabetes Mellitus Hemoglobin A1c 5.2% this admission. On Metformin and Ozempic at home. These can be resumed at discharge.  Hypothyroid  She takes of synthroid at home. TSH 2.2   Patient seen and examined by myself and Dr. Anne Fu and deemed stable for discharge.  I will schedule her follow-up in approximately 1 month.  We discussed other secondary causes of chest pain that may be contributing.   Did the patient have an acute coronary syndrome (MI, NSTEMI, STEMI, etc) this admission?:   _____________  Discharge Vitals Blood pressure 113/66, pulse 75, temperature 98 F (36.7 C), temperature source Oral, resp. rate 16, height 5' 6.5" (1.689 m), weight 82.9 kg, SpO2 98%.  Filed Weights   09/25/23 1335 09/27/23 1257  Weight: 81.6 kg 82.9 kg    Labs & Radiologic Studies    CBC Recent Labs    09/25/23 1254 09/27/23 0548  WBC 9.3 6.7  NEUTROABS 6.1  --   HGB 13.2 12.0  HCT 37.6 35.8*  MCV 94.0 97.0  PLT 169 154   Basic Metabolic Panel Recent Labs    09/81/19 1254 09/27/23 0548  NA 137  135  K 3.7 3.9  CL 105 106  CO2 19* 21*  GLUCOSE 113* 98  BUN 13 13  CREATININE 0.91 1.00  CALCIUM 9.8 9.3   Liver Function Tests Recent Labs    09/25/23 1254  AST 16  ALT 10  ALKPHOS 66  BILITOT 0.8  PROT 7.3  ALBUMIN 4.4   No results for input(s): "LIPASE", "AMYLASE" in the last 72 hours. High Sensitivity Troponin:   Recent Labs  Lab 09/25/23 1251 09/25/23 1439  TROPONINIHS 33* 29*    BNP Invalid input(s): "POCBNP" D-Dimer No results for input(s): "DDIMER" in the last 72 hours. Hemoglobin A1C Recent Labs    09/26/23 0930  HGBA1C 5.2   Fasting Lipid Panel Recent Labs    09/27/23 0548  CHOL 131  HDL 57  LDLCALC 57  TRIG 85  CHOLHDL 2.3   Thyroid Function Tests No results for input(s): "TSH", "T4TOTAL", "T3FREE", "THYROIDAB" in the last 72 hours.  Invalid input(s): "FREET3" _____________  NM Myocar Multi W/Spect W/Wall Motion / EF  Result Date: 09/27/2023 CLINICAL  DATA:  Chest pain anginal equivalent EXAM: MYOCARDIAL IMAGING WITH SPECT (REST AND PHARMACOLOGIC-STRESS) GATED LEFT VENTRICULAR WALL MOTION STUDY LEFT VENTRICULAR EJECTION FRACTION TECHNIQUE: Standard myocardial SPECT imaging was performed after resting intravenous injection of 10 mCi Tc-64m Myoview. Subsequently, intravenous infusion of Lexiscan was performed under the supervision of the Cardiology staff. At peak effect of the drug, 32 mCi Tc-62m Myoview was injected intravenously and standard myocardial SPECT imaging was performed. Quantitative gated imaging was also performed to evaluate left ventricular wall motion, and estimate left ventricular ejection fraction. COMPARISON:  CT 09/13/2022 FINDINGS: Perfusion: Focal areas of decreased uptake along the apex and some involvement of the extreme distal anterior and inferior walls. Otherwise near symmetric distribution radiotracer comparing stress and rest. Wall Motion: There is some dyskinesis of the apex. Known ventricular aneurysm. Left Ventricular  Ejection Fraction: 45 % End diastolic volume 111 ml End systolic volume 61 ml IMPRESSION: 1. 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 2. No reversible changes identified. 3. Left ventricular ejection fraction 45% 4. Non invasive risk stratification*: Intermediate *2012 Appropriate Use Criteria for Coronary Revascularization Focused Update: J Am Coll Cardiol. 2012;59(9):857-881. http://content.dementiazones.com.aspx?articleid=[email or web address] Electronically Signed   By: Karen Kays M.D.   On: 09/27/2023 15:11   DG Chest 2 View  Result Date: 09/25/2023 CLINICAL DATA:  Chest pain. EXAM: CHEST - 2 VIEW COMPARISON:  Radiographs 09/13/2022.  CT 09/13/2022. FINDINGS: The heart size and mediastinal contours are stable. There are coronary artery calcifications and probable stents. The lungs are clear. No pleural effusion or pneumothorax. No acute osseous findings are evident. There are mild degenerative changes in the spine. Telemetry leads overlie the chest. IMPRESSION: No evidence of acute cardiopulmonary process. Coronary artery calcifications. Electronically Signed   By: Carey Bullocks M.D.   On: 09/25/2023 14:15   Disposition   Pt is being discharged home today in good condition.  Follow-up Plans & Appointments     Follow-up Information     Louanne Skye Devoria Albe., NP Follow up.   Specialty: Cardiology Why: Friday Oct 28, 2023 Arrive by 2:05 PMAppt at 2:20 PM (25 min) Contact information: [address] [place] [address] [phone]                   Discharge Medications   Allergies as of 09/27/2023       Reactions   Benazepril Cough   Ciprofloxacin Itching   Naproxen Sodium Other (See Comments)   Stomach burns        Medication List     STOP taking these medications    aspirin EC 81 MG tablet       TAKE these medications    acetaminophen 500 MG tablet Commonly known as: TYLENOL Take 500 mg by  mouth every 6 (six) hours as needed for mild pain or headache.   atorvastatin 80 MG tablet Commonly known as: LIPITOR Take 1 tablet by mouth once daily   Cholecalciferol 125 MCG (5000 UT) Tabs Take 5,000 Units by mouth daily.   cyanocobalamin 1000 MCG tablet Commonly known as: VITAMIN B12 Take 1 tablet by mouth daily.   Eliquis 5 MG Tabs tablet Generic drug: apixaban Take 1 tablet (5 mg total) by mouth 2 (two) times daily.   escitalopram 20 MG tablet Commonly known as: LEXAPRO Take 20 mg by mouth daily.   ezetimibe 10 MG tablet Commonly known as: ZETIA Take 1 tablet by mouth once daily   Ferrex 150 150  MG capsule Generic drug: iron polysaccharides Take 1 capsule by mouth once daily   irbesartan 300 MG tablet Commonly known as: AVAPRO Take 300 mg by mouth daily.   isosorbide mononitrate 30 MG 24 hr tablet Commonly known as: IMDUR Take 0.5 tablets (15 mg total) by mouth daily.   levothyroxine 88 MCG tablet Commonly known as: SYNTHROID Take 88 mcg by mouth daily before breakfast. What changed: Another medication with the same name was removed. Continue taking this medication, and follow the directions you see here.   memantine 10 MG tablet Commonly known as: NAMENDA Take 10 mg by mouth 2 (two) times daily.   metFORMIN 500 MG 24 hr tablet Commonly known as: GLUCOPHAGE-XR Take 1,000 mg by mouth daily.   metoprolol succinate 50 MG 24 hr tablet Commonly known as: Toprol XL Take 1 tablet (50 mg total) by mouth in the morning. Hold if systolic blood pressure (top blood pressure number) less than 100 mmHg or heart rate less than 60 bpm (pulse).   nitroGLYCERIN 0.4 MG SL tablet Commonly known as: Nitrostat Place 1 tablet (0.4 mg total) under the tongue every 5 (five) minutes as needed for chest pain. If you require more than two tablets five minutes apart go to the nearest ER via EMS. What changed: reasons to take this   Ozempic (0.25 or 0.5 MG/DOSE) 2 MG/3ML  Sopn Generic drug: Semaglutide(0.25 or 0.5MG /DOS) Inject 0.5 mg into the skin every Monday.   pantoprazole 40 MG tablet Commonly known as: PROTONIX Take 1 tablet by mouth once daily   Vascepa 1 g capsule Generic drug: icosapent Ethyl Take 2 capsules by mouth twice daily           Outstanding Labs/Studies    Duration of Discharge Encounter   Greater than 30 minutes including physician time.  Signed, Abagail Kitchens, PA-C 09/27/2023, 5:05 PM  Personally seen and examined. Agree with above.  In NAD. Son at bedside. Had stopped Lexapro on her own. Increased anxiety. Called PCP, restarted.  NUC stress with no ischemia. Fixed apical defect EF calc 45%. Will recommend ECHO as outpatient to verfify Feels well currently Trop 33 to 29 If symptoms worsen or become more worrisome, cardiac cath may be warranted.  Discussed with her and son.   OK for DC  Donato Schultz, MD

## 2023-09-27 NOTE — ED Notes (Signed)
Remains in stress test

## 2023-09-27 NOTE — Plan of Care (Addendum)
Pending stress test. She is still in the stress lab. I spoke with her family. They are concerned about anxiety as the reason for her admission. We discussed this is possible, and the test will aid in this assessment.   If the stress test is normal, she can be discharged today.  Tele shows sinus with infrequent PVCs. If not would consider intervention.

## 2023-09-27 NOTE — Progress Notes (Cosign Needed Addendum)
   Patient Name: [name] Date of Encounter: 09/27/2023 [facility] Cardiologist: Tessa Lerner, DO   Interval Summary  .    Saw patient for Lexiscan today.  Had been complaining of tinnitus and mild headache prior to testing.   Vital Signs .    Vitals:   09/27/23 1101 09/27/23 1103 09/27/23 1216 09/27/23 1257  BP: (!) 193/105 (!) 163/73 135/79 113/66  Pulse: 91 91 73 75  Resp:   16 16  Temp:   98.2 F (36.8 C) 98 F (36.7 C)  TempSrc:   Oral Oral  SpO2:   100% 98%  Weight:    82.9 kg  Height:    5' 6.5" (1.689 m)   No intake or output data in the 24 hours ending 09/27/23 1603    09/27/2023   12:57 PM 09/25/2023    1:35 PM 05/30/2023    9:43 AM  Last 3 Weights  Weight (lbs) 182 lb 11.2 oz 180 lb 200 lb  Weight (kg) 82.872 kg 81.647 kg 90.719 kg      Telemetry/ECG    Sinus 80s, NSVT 15 sec Personally Reviewed  CV Studies    Lexi scan 09/27/2023 IMPRESSION: 1. 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   2. No reversible changes identified.   3. Left ventricular ejection fraction 45%   4. Non invasive risk stratification*: Intermediate  Physical Exam .   GEN: No acute distress.   Neck: No JVD Cardiac: RRR, no murmurs, rubs, or gallops.  Respiratory: Clear to auscultation bilaterally. GI: Soft, nontender, non-distended  MS: No edema  Assessment & Plan .     Chest pain CAD status post DES to mid and proximal LAD in 2021 Presenting with intermittent chest tightness for the last 2 weeks that is nonexertional.  Question anxiety component.  EKG shows chronic inferior lateral ST elevation.  Troponins mildly elevated and flat 33-29.  Previous echocardiogram showed preserved EF 66% in August 2024.  Lexiscan was ordered that shows fixed apical defect with no reversible changes.  Suspect some of her symptoms may be attributed to anxiety.  Son reports she has been off her SSRI for multiple months now. On aspirin  81 mg, atorvastatin 80 mg, Toprol-XL 50 mg, Zetia.  Possible discharge on Imdur 20 mg.  Previously did not tolerate nitroglycerin due to severe headaches.  Paroxysmal atrial flutter Maintaining sinus rhythm here and developed to during stress test in 2021 with no known recurrences.  She was on Eliquis PTA.  Likely need to stop aspirin with eliquis at DC.  Hyperlipidemia See above.  Likely needs repeat lipid panel outpatient  Hypertension Has wide range of blood pressures.  She is on beta-blocker as above along with irbesartan 300 mg daily.  May consider up titration versus adding another medication.   Diabetes Well-controlled 5.2%    For questions or updates, please contact [facility] Please consult [email or web address] for contact info under        Signed, Abagail Kitchens, PA-C

## 2023-09-27 NOTE — Plan of Care (Signed)

## 2023-09-28 ENCOUNTER — Other Ambulatory Visit: Payer: Self-pay | Admitting: Hematology

## 2023-10-24 ENCOUNTER — Other Ambulatory Visit: Payer: Self-pay | Admitting: Hematology

## 2023-10-28 ENCOUNTER — Ambulatory Visit: Payer: Medicare HMO | Admitting: Nurse Practitioner

## 2023-10-31 DIAGNOSIS — I10 Essential (primary) hypertension: Secondary | ICD-10-CM | POA: Diagnosis not present

## 2023-10-31 DIAGNOSIS — R451 Restlessness and agitation: Secondary | ICD-10-CM | POA: Diagnosis not present

## 2023-10-31 DIAGNOSIS — F419 Anxiety disorder, unspecified: Secondary | ICD-10-CM | POA: Diagnosis not present

## 2023-10-31 DIAGNOSIS — R4189 Other symptoms and signs involving cognitive functions and awareness: Secondary | ICD-10-CM | POA: Diagnosis not present

## 2023-11-14 ENCOUNTER — Other Ambulatory Visit: Payer: Self-pay | Admitting: Cardiology

## 2023-11-16 DIAGNOSIS — F419 Anxiety disorder, unspecified: Secondary | ICD-10-CM | POA: Diagnosis not present

## 2023-11-16 DIAGNOSIS — N390 Urinary tract infection, site not specified: Secondary | ICD-10-CM | POA: Diagnosis not present

## 2023-11-16 DIAGNOSIS — I10 Essential (primary) hypertension: Secondary | ICD-10-CM | POA: Diagnosis not present

## 2023-11-16 DIAGNOSIS — I2510 Atherosclerotic heart disease of native coronary artery without angina pectoris: Secondary | ICD-10-CM | POA: Diagnosis not present

## 2023-11-27 ENCOUNTER — Other Ambulatory Visit: Payer: Self-pay | Admitting: Hematology

## 2023-12-28 ENCOUNTER — Encounter: Payer: Self-pay | Admitting: Gastroenterology

## 2023-12-28 ENCOUNTER — Other Ambulatory Visit: Payer: Self-pay | Admitting: Hematology

## 2024-01-26 ENCOUNTER — Other Ambulatory Visit: Payer: Self-pay | Admitting: Hematology

## 2024-02-15 ENCOUNTER — Encounter: Payer: Self-pay | Admitting: Gastroenterology

## 2024-02-15 ENCOUNTER — Other Ambulatory Visit (INDEPENDENT_AMBULATORY_CARE_PROVIDER_SITE_OTHER)

## 2024-02-15 ENCOUNTER — Ambulatory Visit
Admission: RE | Admit: 2024-02-15 | Discharge: 2024-02-15 | Disposition: A | Discharge status: Home / Self Care | Source: Ambulatory Visit | Attending: Gastroenterology

## 2024-02-15 ENCOUNTER — Ambulatory Visit: Admitting: Gastroenterology

## 2024-02-15 VITALS — BP 112/68 | HR 57 | Ht 67.5 in | Wt 197.4 lb

## 2024-02-15 DIAGNOSIS — K529 Noninfective gastroenteritis and colitis, unspecified: Secondary | ICD-10-CM | POA: Diagnosis not present

## 2024-02-15 DIAGNOSIS — R197 Diarrhea, unspecified: Secondary | ICD-10-CM

## 2024-02-15 DIAGNOSIS — D509 Iron deficiency anemia, unspecified: Secondary | ICD-10-CM

## 2024-02-15 DIAGNOSIS — K219 Gastro-esophageal reflux disease without esophagitis: Secondary | ICD-10-CM | POA: Diagnosis not present

## 2024-02-15 DIAGNOSIS — K5900 Constipation, unspecified: Secondary | ICD-10-CM | POA: Diagnosis not present

## 2024-02-15 LAB — COMPREHENSIVE METABOLIC PANEL WITH GFR
ALT: 14 U/L (ref 0–35)
AST: 18 U/L (ref 0–37)
Albumin: 4.3 g/dL (ref 3.5–5.2)
Alkaline Phosphatase: 66 U/L (ref 39–117)
BUN: 17 mg/dL (ref 6–23)
CO2: 26 meq/L (ref 19–32)
Calcium: 9.6 mg/dL (ref 8.4–10.5)
Chloride: 107 meq/L (ref 96–112)
Creatinine, Ser: 1 mg/dL (ref 0.40–1.20)
GFR: 56.8 mL/min — ABNORMAL LOW (ref >60.00)
Glucose, Bld: 83 mg/dL (ref 70–99)
Potassium: 4 meq/L (ref 3.5–5.1)
Sodium: 140 meq/L (ref 135–145)
Total Bilirubin: 0.7 mg/dL (ref 0.2–1.2)
Total Protein: 7.2 g/dL (ref 6.0–8.3)

## 2024-02-15 LAB — CBC WITH DIFFERENTIAL/PLATELET
Basophils Absolute: 0 10*3/uL (ref 0.0–0.1)
Basophils Relative: 0.4 % (ref 0.0–3.0)
Eosinophils Absolute: 0.1 10*3/uL (ref 0.0–0.7)
Eosinophils Relative: 1.8 % (ref 0.0–5.0)
HCT: 34.8 % — ABNORMAL LOW (ref 36.0–46.0)
Hemoglobin: 11.5 g/dL — ABNORMAL LOW (ref 12.0–15.0)
Lymphocytes Relative: 29.3 % (ref 12.0–46.0)
Lymphs Abs: 2 10*3/uL (ref 0.7–4.0)
MCHC: 33.1 g/dL (ref 30.0–36.0)
MCV: 95.6 fl (ref 78.0–100.0)
Monocytes Absolute: 0.5 10*3/uL (ref 0.1–1.0)
Monocytes Relative: 7.1 % (ref 3.0–12.0)
Neutro Abs: 4.2 10*3/uL (ref 1.4–7.7)
Neutrophils Relative %: 61.4 % (ref 43.0–77.0)
Platelets: 195 10*3/uL (ref 150.0–400.0)
RBC: 3.64 Mil/uL — ABNORMAL LOW (ref 3.87–5.11)
RDW: 13.5 % (ref 11.5–15.5)
WBC: 6.9 10*3/uL (ref 4.0–10.5)

## 2024-02-15 LAB — IBC + FERRITIN
Ferritin: 9.3 ng/mL — ABNORMAL LOW (ref 10.0–291.0)
Iron: 52 ug/dL (ref 42–145)
Saturation Ratios: 11.9 % — ABNORMAL LOW (ref 20.0–50.0)
TIBC: 438.2 ug/dL (ref 250.0–450.0)
Transferrin: 313 mg/dL (ref 212.0–360.0)

## 2024-02-15 LAB — TSH: TSH: 0.51 u[IU]/mL (ref 0.35–5.50)

## 2024-02-15 LAB — C-REACTIVE PROTEIN: CRP: <1 mg/dL (ref 0.5–20.0)

## 2024-02-15 NOTE — Patient Instructions (Addendum)
 Avoid caffeine, dairy, and high sugar items which may cause more diarrhea. Stay hydrated. Please schedule follow-up with cardiologist to follow-up on hospitalization in December.   Your provider has requested that you go to the basement level for lab work before leaving today. Press "B" on the elevator. The lab is located at the first door on the left as you exit the elevator.  _______________________________________________________  If your blood pressure at your visit was 140/90 or greater, please contact your primary care physician to follow up on this.  _______________________________________________________  If you are age 29 or older, your body mass index should be between 23-30. Your Body mass index is 30.46 kg/m. If this is out of the aforementioned range listed, please consider follow up with your Primary Care Provider.  If you are age 63 or younger, your body mass index should be between 19-25. Your Body mass index is 30.46 kg/m. If this is out of the aformentioned range listed, please consider follow up with your Primary Care Provider.   ________________________________________________________  The [facility] providers would like to encourage you to use MYCHART to communicate with providers for non-urgent requests or questions.  Due to long hold times on the telephone, sending your provider a message by [facility] may be a faster and more efficient way to get a response.  Please allow 48 business hours for a response.  Please remember that this is for non-urgent requests.  _______________________________________________________  Thank you for trusting me with your gastrointestinal care!   Dyanna Glasgow, RNP

## 2024-02-15 NOTE — Progress Notes (Signed)
 Chief Complaint: Discuss colonoscopy Primary GI Doctor: (previously Dr. Sandrea Cruel) Dr. Rosaline Coma  HPI:  Patient is a 73 year old female patient with past medical history of prediabetes, arthritis, CAD, diabetes, GERD, hypertension, hypothyroidism, paroxysmal atrial flutter on Eliquis , who was referred to me by Jhon Moselle, FNP on 11/16/2023 to discuss colonoscopy.  On 09/25/2023 patient presented to ED with chest pain.EKG showed normal sinus rhythm with abnormal R wave progression and T wave inversions in leads I and aVL but there were no acute changes compared to prior tracings.  High-sensitivity troponin was minimally elevated and flat at 33 >>29. WBC 9.3, Hgb 13.2, Plts 169. Na 137, K 3.7, Cr 0.91.  Chest x-ray showed no acute findings.  She was transferred to [facility] under cardiology service for further evaluation.  Suspect some of her symptoms Charlena Haub be attributed to anxiety, reports not taking her SSRI as prescribed.  Fixed apical defect. EF calc 45%. Will recommend ECHO as outpatient to verify. Trop 33 to 29.  Interval History    Patient presents today to discuss dark stools with chronic diarrhea,accompanied by her sister. She states the dark tarry stools have been going on for about 8 mths.  Patient has been taking ferrex 150 mg po daily for over a year for chronic anemia.      Patient states she has also had chronic diarrhea, but as of recent it has increased in severity and frequency. Patient states she has about 4-5 bowel movements semi formed stools with urgency. She denies abdominal pain. Patient states she has history of constipation, but it had been over 4 years since she has had that issue. No known food triggers. No new medications.  She has tried OTC pepto Bismul in the past without improvement in symptoms. Patient had UTI from [facility] in January and treated with antibiotics.      Patient has history of  GERD and taking pantoprazole  40 mg po daily. Patient denies dysphagia. Patient  denies nausea, vomiting, or weight loss. She has actually gained weight. Her sister admits she snacks a lot.  She is currently on Ozempic.   Nonsmoker. No alcohol use.   Patient is on Eliquis  5 mg twice daily  Patient never had EGD.   Patient's family history includes father with colon CA in early 21s  Wt Readings from Last 3 Encounters:  02/15/24 197 lb 6 oz (89.5 kg)  09/27/23 182 lb 11.2 oz (82.9 kg)  05/30/23 200 lb (90.7 kg)    Past Medical History:  Diagnosis Date   Arthritis    Coronary artery disease    Depression    Diabetes mellitus without complication (HCC)    GERD (gastroesophageal reflux disease)    Hypercholesteremia    Hypertension    Hypothyroidism    Liver cancer (HCC)    Pre-diabetes 08/19/2019   Sleep apnea    mild-modreate-tried cpap   Past Surgical History:  Procedure Laterality Date   CARPAL TUNNEL RELEASE     right   COLONOSCOPY     CORONARY ANGIOPLASTY WITH STENT PLACEMENT     CORONARY STENT INTERVENTION N/A 03/18/2020   Procedure: CORONARY STENT INTERVENTION;  Surgeon: Cody Das, MD;  Location: [facility];  Service: Cardiovascular;  Laterality: N/A;   CORONARY ULTRASOUND/IVUS N/A 03/18/2020   Procedure: Intravascular Ultrasound/IVUS;  Surgeon: Cody Das, MD;  Location: [facility];  Service: Cardiovascular;  Laterality: N/A;   DILATION AND CURETTAGE OF UTERUS     LEFT  HEART CATH AND CORONARY ANGIOGRAPHY N/A 03/18/2020   Procedure: LEFT HEART CATH AND CORONARY ANGIOGRAPHY;  Surgeon: Cody Das, MD;  Location: [facility];  Service: Cardiovascular;  Laterality: N/A;   ORIF ANKLE FRACTURE  04/13/2012   Procedure: OPEN REDUCTION INTERNAL FIXATION (ORIF) ANKLE FRACTURE;  Surgeon: Amada Backer, MD;  Location: [facility];  Service: Orthopedics;  Laterality: Left;   TONSILLECTOMY      Current Outpatient Medications  Medication Sig Dispense Refill   acetaminophen  (TYLENOL ) 500 MG tablet  Take 500 mg by mouth every 6 (six) hours as needed for mild pain or headache.     amLODipine  (NORVASC ) 5 MG tablet Take 5 mg by mouth daily.     apixaban  (ELIQUIS ) 5 MG TABS tablet Take 1 tablet (5 mg total) by mouth 2 (two) times daily. 180 tablet 1   atorvastatin  (LIPITOR ) 80 MG tablet Take 1 tablet by mouth once daily 90 tablet 3   busPIRone (BUSPAR) 10 MG tablet Take 10 mg by mouth 2 (two) times daily.     Cholecalciferol 125 MCG (5000 UT) TABS Take 5,000 Units by mouth daily.     cyanocobalamin  (VITAMIN B12) 1000 MCG tablet Take 1 tablet by mouth daily.     escitalopram  (LEXAPRO ) 20 MG tablet Take 20 mg by mouth daily.     ezetimibe  (ZETIA ) 10 MG tablet Take 1 tablet by mouth once daily 90 tablet 3   FERREX 150 150 MG capsule Take 1 capsule by mouth once daily 30 capsule 0   irbesartan  (AVAPRO ) 300 MG tablet Take 300 mg by mouth daily.     levothyroxine  (SYNTHROID ) 88 MCG tablet Take 88 mcg by mouth daily before breakfast.     memantine  (NAMENDA ) 10 MG tablet Take 10 mg by mouth 2 (two) times daily.     metFORMIN (GLUCOPHAGE-XR) 500 MG 24 hr tablet Take 1,000 mg by mouth daily.     metoprolol  succinate (TOPROL  XL) 50 MG 24 hr tablet Take 1 tablet (50 mg total) by mouth in the morning. Hold if systolic blood pressure (top blood pressure number) less than 100 mmHg or heart rate less than 60 bpm (pulse). 90 tablet 2   pantoprazole  (PROTONIX ) 40 MG tablet Take 1 tablet by mouth once daily 90 tablet 3   Semaglutide,0.25 or 0.5MG /DOS, (OZEMPIC, 0.25 OR 0.5 MG/DOSE,) 2 MG/3ML SOPN Inject 0.5 mg into the skin every Monday.     VASCEPA  1 g capsule Take 2 capsules by mouth twice daily 360 capsule 3   isosorbide  mononitrate (IMDUR ) 30 MG 24 hr tablet Take 0.5 tablets (15 mg total) by mouth daily. (Patient not taking: Reported on 02/15/2024) 30 tablet 2   nitroGLYCERIN  (NITROSTAT ) 0.4 MG SL tablet Place 1 tablet (0.4 mg total) under the tongue every 5 (five) minutes as needed for chest pain. If you  require more than two tablets five minutes apart go to the nearest ER via EMS. (Patient taking differently: Place 0.4 mg under the tongue every 5 (five) minutes as needed for chest pain (PRN only). If you require more than two tablets five minutes apart go to the nearest ER via EMS.) 30 tablet 0   No current facility-administered medications for this visit.    Allergies as of 02/15/2024 - Review Complete 02/15/2024  Allergen Reaction Noted   Benazepril Cough 02/04/2020   Ciprofloxacin Itching 11/12/2008   Naproxen sodium Other (See Comments) 09/30/2011    Family History  Problem Relation Age of Onset  Dementia Mother    Colon cancer Father    Heart attack Father    Hypertension Sister    Breast cancer Sister    Hypertension Sister    Hypertension Sister    Hypertension Sister     Review of Systems:    Constitutional: No weight loss, fever, chills, weakness or fatigue HEENT: Eyes: No change in vision               Ears, Nose, Throat:  No change in hearing or congestion Skin: No rash or itching Cardiovascular: No chest pain, chest pressure or palpitations   Respiratory: No SOB or cough Gastrointestinal: See HPI and otherwise negative Genitourinary: No dysuria or change in urinary frequency Neurological: No headache, dizziness or syncope Musculoskeletal: No new muscle or joint pain Hematologic: No bleeding or bruising Psychiatric: No history of depression or anxiety    Physical Exam:  Vital signs: BP 112/68   Pulse (!) 57   Ht 5' 7.5" (1.715 m)   Wt 197 lb 6 oz (89.5 kg)   SpO2 98%   BMI 30.46 kg/m   Constitutional: Pleasant  female appears to be in NAD, Well developed, Well nourished, alert and cooperative Throat: Oral cavity and pharynx without inflammation, swelling or lesion.  Respiratory: Respirations even and unlabored. Lungs clear to auscultation bilaterally.   No wheezes, crackles, or rhonchi.  Cardiovascular: Normal S1, S2. Regular rate and rhythm. No  peripheral edema, cyanosis or pallor.  Gastrointestinal:  Soft, nondistended, nontender. No rebound or guarding. Normal bowel sounds. No appreciable masses or hepatomegaly. Rectal:  Not performed.  Msk:  Symmetrical without gross deformities. Without edema, no deformity or joint abnormality.  Neurologic:  Alert and  oriented x4;  grossly normal neurologically.  Skin:   Dry and intact without significant lesions or rashes. Psychiatric: Oriented to person, place and time. Demonstrates good judgement and reason without abnormal affect or behaviors.  RELEVANT LABS AND IMAGING: CBC    Latest Ref Rng & Units 09/27/2023    5:48 AM 09/25/2023   12:54 PM 03/11/2023   12:18 PM  CBC  WBC 4.0 - 10.5 K/uL 6.7  9.3  6.5   Hemoglobin 12.0 - 15.0 g/dL 16.1  09.6  04.5   Hematocrit 36.0 - 46.0 % 35.8  37.6  32.6    32.6   Platelets 150 - 400 K/uL 154  169  209      CMP     Latest Ref Rng & Units 09/27/2023    5:48 AM 09/25/2023   12:54 PM 03/11/2023   12:18 PM  CMP  Glucose 70 - 99 mg/dL 98  409  88   BUN 8 - 23 mg/dL 13  13  19    Creatinine 0.44 - 1.00 mg/dL 8.11  9.14  7.82   Sodium 135 - 145 mmol/L 135  137  139   Potassium 3.5 - 5.1 mmol/L 3.9  3.7  4.4   Chloride 98 - 111 mmol/L 106  105  106   CO2 22 - 32 mmol/L 21  19  28    Calcium  8.9 - 10.3 mg/dL 9.3  9.8  9.5   Total Protein 6.5 - 8.1 g/dL  7.3  6.8   Total Bilirubin <1.2 mg/dL  0.8  0.5   Alkaline Phos 38 - 126 U/L  66  79   AST 15 - 41 U/L  16  23   ALT 0 - 44 U/L  10  22      Lab  Results  Component Value Date   TSH 2.284 03/11/2023  11/16/23 labs show-vitamin D 44.5, TSH 2.02, WBC 6.8, hemoglobin 12.4, platelets 87, normal kidney function, normal LFTs, B12 1504, HA1C 5.6, 05/19/23 echo-Left ventricle cavity is normal in size. Moderate concentric hypertrophy  of the left ventricle. Normal global wall motion. Normal LV systolic  function with EF 66%.  12/28/2002 Colonoscopy with Dr. Sandrea Cruel NORMAL EXAM: Cecum to Sigmoid Colon.   OTHER FINDING: Nodule found in Rectum. Biopsy/Other Finding taken. Comments: probable hypertrophied anal papillae.  Path: rectal tag benign squamous mucosa.  No dysplasia or malignancy identified.  Assessment: Encounter Diagnoses  Name Primary?   Diarrhea, unspecified type Yes   Iron  deficiency anemia, unspecified iron  deficiency anemia type    Gastroesophageal reflux disease, unspecified whether esophagitis present       73 year old female patient that presents with chronic diarrhea that has worsened in the last few months.  Patient did recently receive antibiotic therapy for UTI.  Will go ahead and check GI profile to rule out bacterial, viral, or parasitic infection, including cdiff.  Will also check inflammatory markers to rule out possible IBD.  Patient also is on metformin which she states she has taken for quite some time which is known to have a potential side effect of diarrhea.  We discussed dietary modifications and cutting out caffeine, dairy, and high fructose syrup.  I will go ahead and recheck her labs to rule out anemia or iron  deficiency.  Most recent Hgb stable at 12.4. Patient states she has had dark stools for about a year now however admits that she takes a daily iron  supplement as well as has used over-the-counter Pepto.  Given her history of constipation I will go ahead and order abdominal x-ray to rule out obstipation.  Patient seems to exhibit some signs of memory impairment and required assistance from her sister throughout the exam.  She was supposed to follow-up with cardiology after her hospitalization back in December for chest pain however was not aware of the appointment that was scheduled in January.  Recommended she follow-up with cardiology to be cleared for any endoscopic procedures.  Once patient has had evaluation by cardiology we can proceed with colon screening colonoscopy, patient is overdue.   Plan: - Check CBC, CMP, iron  panel, ferritin,CRP, TTG IgA, IgA, TSH   - Order GI profile with c diff, and fecal calprotectin - avoid caffeine, dairy, and fructose corn syrup - Order ABD xray 2 view to r/o obstipation -needs cardiac clearance, await workup from Dr. Prolia before proceeding with colonoscopy. Her sister will assist with making a appointment.  -follow-up in 2 mths with me  Thank you for the courtesy of this consult. Please call me with any questions or concerns.   Lilliemae Fruge, FNP-C [facility] 02/15/2024, 12:46 PM  Cc: Jhon Moselle, FNP

## 2024-02-15 NOTE — Progress Notes (Signed)
 I agree with the assessment and plan as outlined by Ms. May.

## 2024-02-16 ENCOUNTER — Encounter: Payer: Self-pay | Admitting: Cardiology

## 2024-02-16 ENCOUNTER — Ambulatory Visit: Attending: Cardiology | Admitting: Cardiology

## 2024-02-16 ENCOUNTER — Other Ambulatory Visit

## 2024-02-16 ENCOUNTER — Ambulatory Visit (HOSPITAL_COMMUNITY)

## 2024-02-16 VITALS — BP 120/70 | HR 57 | Resp 16 | Ht 67.0 in | Wt 198.0 lb

## 2024-02-16 DIAGNOSIS — Z955 Presence of coronary angioplasty implant and graft: Secondary | ICD-10-CM | POA: Diagnosis not present

## 2024-02-16 DIAGNOSIS — E66811 Obesity, class 1: Secondary | ICD-10-CM | POA: Diagnosis not present

## 2024-02-16 DIAGNOSIS — Z01818 Encounter for other preprocedural examination: Secondary | ICD-10-CM

## 2024-02-16 DIAGNOSIS — E781 Pure hyperglyceridemia: Secondary | ICD-10-CM | POA: Diagnosis not present

## 2024-02-16 DIAGNOSIS — I10 Essential (primary) hypertension: Secondary | ICD-10-CM

## 2024-02-16 DIAGNOSIS — I4892 Unspecified atrial flutter: Secondary | ICD-10-CM | POA: Diagnosis not present

## 2024-02-16 DIAGNOSIS — E6609 Other obesity due to excess calories: Secondary | ICD-10-CM | POA: Diagnosis not present

## 2024-02-16 DIAGNOSIS — E782 Mixed hyperlipidemia: Secondary | ICD-10-CM | POA: Diagnosis not present

## 2024-02-16 DIAGNOSIS — E119 Type 2 diabetes mellitus without complications: Secondary | ICD-10-CM | POA: Diagnosis not present

## 2024-02-16 DIAGNOSIS — Z7901 Long term (current) use of anticoagulants: Secondary | ICD-10-CM | POA: Diagnosis not present

## 2024-02-16 DIAGNOSIS — R9431 Abnormal electrocardiogram [ECG] [EKG]: Secondary | ICD-10-CM

## 2024-02-16 DIAGNOSIS — I2510 Atherosclerotic heart disease of native coronary artery without angina pectoris: Secondary | ICD-10-CM | POA: Diagnosis not present

## 2024-02-16 DIAGNOSIS — R197 Diarrhea, unspecified: Secondary | ICD-10-CM | POA: Diagnosis not present

## 2024-02-16 DIAGNOSIS — Z6831 Body mass index (BMI) 31.0-31.9, adult: Secondary | ICD-10-CM

## 2024-02-16 LAB — IGA: Immunoglobulin A: 130 mg/dL (ref 70–320)

## 2024-02-16 LAB — TISSUE TRANSGLUTAMINASE ABS,IGG,IGA
(tTG) Ab, IgA: <1 U/mL
(tTG) Ab, IgG: <1 U/mL

## 2024-02-16 NOTE — Progress Notes (Signed)
 Cardiology Office Note:  .   Date:  02/16/2024  ID:  [name], DOB June 13, 1951, MRN [id number] PCP:  Jhon Moselle, FNP  Former Cardiology Providers: None [facility] Providers Cardiologist:  Olinda Bertrand, DO , [facility] (established care 02/04/2020) Electrophysiologist:  None  Click to update primary MD,subspecialty MD or APP then REFRESH:1}    Chief Complaint  Patient presents with   Pre-op Exam   Follow-up    History of Present Illness: .   [name] is a 73 y.o. Caucasian female whose past medical history and cardiovascular risk factors includes: Coronary artery disease status post angioplasty and stent, hypertension, hyperlipidemia, hypothyroidism, GERD, postmenopausal female, advanced age, non-insulin -dependent diabetes mellitus type 2, Paroxysmal atrial flutter.  Patient was referred to the practice back in April 2021 for an abnormal EKG and multiple cardiovascular risk factors.  Given her symptoms and presentation she did undergo ischemic workup was noted to have obstructive disease in the LAD distribution.  She underwent angiography and eventually PCI to the LAD.  Patient is also being followed by the practice given her history of paroxysmal atrial flutter.  In December 2024 patient presented to ER with substernal chest pain that awakened her couple times at night and persisted into the day.  EKG was reported to be abnormal and high sensitive troponins were above normal limits but essentially flat.  She was transferred to [facility] for further evaluation.  During the hospitalization she underwent nuclear stress test which did not illustrate any reversible defect but still reported to be intermediate due to fixed perfusion defect and mildly reduced LVEF.  Patient presents today for follow-up and preprocedural risk assessment for upcoming colonoscopy as she has been experiencing black stools.  Unsure if this secondary to oral anticoagulation or iron   supplementation.  She is accompanied by her sister who also provides collateral history due to concerns that patient may be experiencing cognitive impairment/dementia.  Patient denies anginal chest pain or heart failure symptoms.   Overall function capacity remains relatively stable, no change in physical endurance. No known severe valvular heart disease, renal function is acceptable, she is a diabetic but not on insulin . The colonoscopy is yet to be scheduled.   Review of Systems: .   Review of Systems  Cardiovascular:  Negative for chest pain, claudication, irregular heartbeat, leg swelling, near-syncope, orthopnea, palpitations, paroxysmal nocturnal dyspnea and syncope.  Respiratory:  Negative for shortness of breath.   Hematologic/Lymphatic: Negative for bleeding problem.  Neurological:  Positive for dizziness (if changes position quickly) and light-headedness (if changes position quickly).    Studies Reviewed:   EKG: EKG Interpretation Date/Time:  Thursday Feb 16 2024 08:34:33 EDT Ventricular Rate:  56 PR Interval:  154 QRS Duration:  84 QT Interval:  472 QTC Calculation: 455 R Axis:   60  Text Interpretation: Sinus bradycardia ST & T wave abnormality, consider inferior ischemia ST & T wave abnormality, consider anterolateral ischemia When compared with ECG of 27-Sep-2023 10:42, ST no longer elevated in Inferior leads Inverted T waves have replaced nonspecific T wave abnormality in Inferior leads T wave inversion now evident in Anterior leads Confirmed by Olinda Bertrand [phone]) on 02/16/2024 8:54:32 AM  Echocardiogram: 02/22/2020: LVEF 59%, moderate concentric LVH, grade 2 diastolic dysfunction, severely dilated left atrium, mild TR.    05/19/2023:  Left ventricle cavity is normal in size. Moderate concentric hypertrophy  of the left ventricle. Normal global wall motion. Normal LV systolic  function with EF 66%. Doppler evidence of  grade I (impaired) diastolic  dysfunction,  normal LAP.  Left atrial cavity is mildly dilated.  Mild (Grade I) mitral regurgitation.  Mild tricuspid regurgitation.  Small anterior pericardial effusion. No hemodynamic significance.  No evidence of pulmonary hypertension.  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  03/07/2020:  1. Mild non-obstructive CAD of the proximal and mid-LAD and proximal LCx, CADRADS = 2. The mid-LAD stenosis may be more significant,however, there is blooming and alignment artifact. Will submit study for FFR, given the abnormal myoview  results in the distal anterior and apical regions. 2. Coronary calcium  score of 148. This was 82nd percentile for age and sex matched control. 3. Normal coronary origin with right dominance. 4. Dilated main pulmonary artery to 37 mm, suggesting pulmonary hypertension. 5.  Aortic atherosclerosis CT FFR ANALYSIS 1. CT FFR demonstrates flow-limiting stenosis of the proximal to mid-LAD, just upstream of the D1 branch, affecting the mid to distal LAD and a large D2 branch. LAD: Significant stenosis. Proximal FFR = 0.96, Mid FFR = 0.66, Distal FFR = 0.60  Stress Testing: Myoview  09/27/2023: 1. 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   2. No reversible changes identified.   3. Left ventricular ejection fraction 45%   4. Non invasive risk stratification*: Intermediate  Heart Catheterization: March 18, 2020: LM: Normal LAD: Prox 75-80% stenosis. Mild calcification.        Mid 75-80% stenosis. Mild calcification.        Prox D1 50% stenosis. LCx: Prox 20% stenosis. RCA: Prox 20% stenosis.        PTCA and stent placement 2.5 X 12 mm Resolute Onyx drug-eluting stent mid LAD     PTCA and stent placement 3.5 X 18 mm Resolute Onyx drug-eluting stent prox LAD  RADIOLOGY: NA  Risk Assessment/Calculations:   Click Here to  Calculate/Change CHADS2VASc Score The patient's CHADS2-VASc score is 5, indicating a 7.2% annual risk of stroke. CHF History: No HTN History: Yes Diabetes History: Yes Stroke History: No Vascular Disease History: Yes  Labs:       Latest Ref Rng & Units 02/15/2024   11:49 AM 09/27/2023    5:48 AM 09/25/2023   12:54 PM  CBC  WBC 4.0 - 10.5 K/uL 6.9  6.7  9.3   Hemoglobin 12.0 - 15.0 g/dL 16.1  09.6  04.5   Hematocrit 36.0 - 46.0 % 34.8  35.8  37.6   Platelets 150.0 - 400.0 K/uL 195.0  154  169        Latest Ref Rng & Units 02/15/2024   11:49 AM 09/27/2023    5:48 AM 09/25/2023   12:54 PM  BMP  Glucose 70 - 99 mg/dL 83  98  409   BUN 6 - 23 mg/dL 17  13  13    Creatinine 0.40 - 1.20 mg/dL 8.11  9.14  7.82   Sodium 135 - 145 mEq/L 140  135  137   Potassium 3.5 - 5.1 mEq/L 4.0  3.9  3.7   Chloride 96 - 112 mEq/L 107  106  105   CO2 19 - 32 mEq/L 26  21  19    Calcium  8.4 - 10.5 mg/dL 9.6  9.3  9.8       Latest Ref Rng & Units 02/15/2024   11:49 AM 09/27/2023    5:48 AM 09/25/2023   12:54 PM  CMP  Glucose 70 -  99 mg/dL 83  98  161   BUN 6 - 23 mg/dL 17  13  13    Creatinine 0.40 - 1.20 mg/dL 0.96  0.45  4.09   Sodium 135 - 145 mEq/L 140  135  137   Potassium 3.5 - 5.1 mEq/L 4.0  3.9  3.7   Chloride 96 - 112 mEq/L 107  106  105   CO2 19 - 32 mEq/L 26  21  19    Calcium  8.4 - 10.5 mg/dL 9.6  9.3  9.8   Total Protein 6.0 - 8.3 g/dL 7.2   7.3   Total Bilirubin 0.2 - 1.2 mg/dL 0.7   0.8   Alkaline Phos 39 - 117 U/L 66   66   AST 0 - 37 U/L 18   16   ALT 0 - 35 U/L 14   10     Lab Results  Component Value Date   CHOL 131 09/27/2023   HDL 57 09/27/2023   LDLCALC 57 09/27/2023   LDLDIRECT 53 07/22/2021   TRIG 85 09/27/2023   CHOLHDL 2.3 09/27/2023   No results for input(s): "LIPOA" in the last 8760 hours. No components found for: "NTPROBNP" No results for input(s): "PROBNP" in the last 8760 hours. Recent Labs    03/11/23 1218 02/15/24 1149  TSH 2.284 0.51     Physical Exam:    Today's Vitals   02/16/24 0830  BP: 120/70  Pulse: (!) 57  Resp: 16  SpO2: 98%  Weight: 198 lb (89.8 kg)  Height: 5\' 7"  (1.702 m)   Body mass index is 31.01 kg/m. Wt Readings from Last 3 Encounters:  02/16/24 198 lb (89.8 kg)  02/15/24 197 lb 6 oz (89.5 kg)  09/27/23 182 lb 11.2 oz (82.9 kg)    Physical Exam  Constitutional: No distress.  Age appropriate, hemodynamically stable.   Neck: No JVD present.  Cardiovascular: Normal rate, regular rhythm, S1 normal, S2 normal, intact distal pulses and normal pulses. Exam reveals no gallop, no S3 and no S4.  No murmur heard. Pulmonary/Chest: Effort normal and breath sounds normal. No stridor. She has no wheezes. She has no rales.  Abdominal: Soft. Bowel sounds are normal. She exhibits no distension. There is no abdominal tenderness.  Musculoskeletal:        General: No edema.     Cervical back: Neck supple.  Neurological: She is alert and oriented to person, place, and time. She has intact cranial nerves (2-12).  Skin: Skin is warm and moist.     Impression & Recommendation(s):  Impression:   ICD-10-CM   1. Preoperative examination  Z01.818 EKG 12-Lead    ECHOCARDIOGRAM COMPLETE    2. Nonspecific abnormal electrocardiogram (ECG) (EKG)  R94.31 ECHOCARDIOGRAM COMPLETE    3. Paroxysmal atrial flutter (HCC)  I48.92     4. Long term (current) use of anticoagulants  Z79.01     5. Atherosclerosis of native coronary artery of native heart without angina pectoris  I25.10     6. History of coronary angioplasty with insertion of stent  Z95.5     7. Essential hypertension  I10     8. Type 2 diabetes mellitus without complication, without long-term current use of insulin  (HCC)  E11.9     9. Hypertriglyceridemia  E78.1     10. Mixed hyperlipidemia  E78.2     11. Class 1 obesity due to excess calories with serious comorbidity and body mass index (BMI) of 31.0 to 31.9 in adult  V25.366    E66.09    Z68.31         Recommendation(s):  Preoperative examination Patient is being considered for colonoscopy due to reported black stools either secondary to oral anticoagulation versus iron  supplementation.  Referred to cardiology for preoperative risk assessment. Clinically denies anginal chest pain or heart failure symptoms. No use of sublingual nitroglycerin  tablets since the last office visit. EKG shows sinus rhythm with ST-T changes majority of which were present on prior tracings.  The anterior changes were also noted back in August 2024. Recently had nuclear stress test which did not illustrate reversible ischemia.  Echo will be ordered to evaluate for structural heart disease and left ventricular systolic function.   According to the Revised Cardiac Risk Index (RCRI), her Perioperative Risk of Major Cardiac Event is (%): 0.9 Her Functional Capacity in METs is: 5.07 according to the Duke Activity Status Index (DASI). Will await the results of the echocardiogram prior to providing a letter for preprocedural recommendations. If and when the colonoscopy is scheduled patient is advised not to take Eliquis  2 days prior to the procedure and the day of.  Restart anticoagulation once cleared by GI post procedure.  Paroxysmal atrial flutter (HCC) Long term (current) use of anticoagulants Rate control: Metoprolol . Rhythm control: N/A. Thromboembolic prophylaxis: Eliquis  Patient states that she is having black stools for which colonoscopy is being considered. Risk, benefits, alternatives to anticoagulation discussed.  Atherosclerosis of native coronary artery of native heart without angina pectoris History of coronary angioplasty with insertion of stent Denies anginal chest pain. No use of sublingual nitroglycerin  tablets since the last office visit. EKG as mentioned above. Most recent MPI study from December 2024 reviewed. Echo will be ordered to evaluate for structural heart disease and left  ventricular systolic function. Continue antiplatelet therapy, statins, Zetia , Vascepa   Essential hypertension Office blood pressures are very well-controlled. Continue Avapro  300 mg p.o. daily. Continue Imdur  15 mg p.o. daily. Continue Toprol -XL 50 mg p.o. daily  Type 2 diabetes mellitus without complication, without long-term current use of insulin  (HCC) Reemphasized importance of glycemic control.  Hypertriglyceridemia Mixed hyperlipidemia Currently on Lipitor  80 mg p.o. nightly. Currently on Zetia  10 mg p.o. daily. Currently on Vascepa  1  2 g twice daily  Class 1 obesity due to excess calories with serious comorbidity and body mass index (BMI) of 31.0 to 31.9 in adult Body mass index is 31.01 kg/m. I reviewed with her importance of diet, regular physical activity/exercise, weight loss.   Patient is educated on the importance of increasing physical activity gradually as tolerated with a goal of moderate intensity exercise for 30 minutes a day 5 days a week.   Orders Placed:  Orders Placed This Encounter  Procedures   EKG 12-Lead   ECHOCARDIOGRAM COMPLETE    Standing Status:   Future    Expected Date:   02/23/2024    Expiration Date:   02/15/2025    Where should this test be performed[facility]    Does the patient weigh less than or greater than 250 lbs?:   Patient weighs less than 250 lbs    Perflutren DEFINITY (image enhancing agent) should be administered unless hypersensitivity or allergy exist:   Administer Perflutren    Reason for exam-Echo:   Other-Full Diagnosis List    Full ICD-10/Reason for Exam:   Abnormal EKG [276271]    Full ICD-10/Reason for Exam:   Pre-operative clearance [440347]     Final Medication List:  No orders of the defined types were placed in this encounter.   There are no discontinued medications.   Current Outpatient Medications:    acetaminophen  (TYLENOL ) 500 MG tablet, Take 500 mg by mouth every 6 (six) hours as needed for mild  pain or headache., Disp: , Rfl:    amLODipine  (NORVASC ) 5 MG tablet, Take 5 mg by mouth daily., Disp: , Rfl:    apixaban  (ELIQUIS ) 5 MG TABS tablet, Take 1 tablet (5 mg total) by mouth 2 (two) times daily., Disp: 180 tablet, Rfl: 1   atorvastatin  (LIPITOR ) 80 MG tablet, Take 1 tablet by mouth once daily, Disp: 90 tablet, Rfl: 3   busPIRone (BUSPAR) 10 MG tablet, Take 10 mg by mouth 2 (two) times daily., Disp: , Rfl:    Cholecalciferol 125 MCG (5000 UT) TABS, Take 5,000 Units by mouth daily., Disp: , Rfl:    cyanocobalamin  (VITAMIN B12) 1000 MCG tablet, Take 1 tablet by mouth daily., Disp: , Rfl:    escitalopram  (LEXAPRO ) 20 MG tablet, Take 20 mg by mouth daily., Disp: , Rfl:    ezetimibe  (ZETIA ) 10 MG tablet, Take 1 tablet by mouth once daily, Disp: 90 tablet, Rfl: 3   FERREX 150 150 MG capsule, Take 1 capsule by mouth once daily, Disp: 30 capsule, Rfl: 0   irbesartan  (AVAPRO ) 300 MG tablet, Take 300 mg by mouth daily., Disp: , Rfl:    isosorbide  mononitrate (IMDUR ) 30 MG 24 hr tablet, Take 0.5 tablets (15 mg total) by mouth daily., Disp: 30 tablet, Rfl: 2   levothyroxine  (SYNTHROID ) 88 MCG tablet, Take 88 mcg by mouth daily before breakfast., Disp: , Rfl:    memantine  (NAMENDA ) 10 MG tablet, Take 10 mg by mouth 2 (two) times daily., Disp: , Rfl:    metFORMIN (GLUCOPHAGE-XR) 500 MG 24 hr tablet, Take 1,000 mg by mouth daily., Disp: , Rfl:    metoprolol  succinate (TOPROL  XL) 50 MG 24 hr tablet, Take 1 tablet (50 mg total) by mouth in the morning. Hold if systolic blood pressure (top blood pressure number) less than 100 mmHg or heart rate less than 60 bpm (pulse)., Disp: 90 tablet, Rfl: 2   pantoprazole  (PROTONIX ) 40 MG tablet, Take 1 tablet by mouth once daily, Disp: 90 tablet, Rfl: 3   Semaglutide,0.25 or 0.5MG /DOS, (OZEMPIC, 0.25 OR 0.5 MG/DOSE,) 2 MG/3ML SOPN, Inject 0.5 mg into the skin every Monday., Disp: , Rfl:    VASCEPA  1 g capsule, Take 2 capsules by mouth twice daily, Disp: 360 capsule,  Rfl: 3   nitroGLYCERIN  (NITROSTAT ) 0.4 MG SL tablet, Place 1 tablet (0.4 mg total) under the tongue every 5 (five) minutes as needed for chest pain. If you require more than two tablets five minutes apart go to the nearest ER via EMS. (Patient taking differently: Place 0.4 mg under the tongue every 5 (five) minutes as needed for chest pain (PRN only). If you require more than two tablets five minutes apart go to the nearest ER via EMS.), Disp: 30 tablet, Rfl: 0  Consent:   NA  Disposition:   1 year follow up   Her questions and concerns were addressed to her satisfaction. She voices understanding of the recommendations provided during this encounter.    Signed, Olinda Bertrand, DO, [facility]  [facility]  02/16/2024 10:35 AM

## 2024-02-16 NOTE — Patient Instructions (Signed)
 Medication Instructions:  Your physician recommends that you continue on your current medications as directed. Please refer to the Current Medication list given to you today.  *If you need a refill on your cardiac medications before your next appointment, please call your pharmacy*  Lab Work: None ordered today. If you have labs (blood work) drawn today and your tests are completely normal, you will receive your results only by: MyChart Message (if you have MyChart) OR A paper copy in the mail If you have any lab test that is abnormal or we need to change your treatment, we will call you to review the results.  Testing/Procedures: Your physician has requested that you have an echocardiogram. Echocardiography is a painless test that uses sound waves to create images of your heart. It provides your doctor with information about the size and shape of your heart and how well your heart's chambers and valves are working. This procedure takes approximately one hour. There are no restrictions for this procedure. Please do NOT wear cologne, perfume, aftershave, or lotions (deodorant is allowed). Please arrive 15 minutes prior to your appointment time.  Please note: We ask at that you not bring children with you during ultrasound (echo/ vascular) testing. Due to room size and safety concerns, children are not allowed in the ultrasound rooms during exams. Our front office staff cannot provide observation of children in our lobby area while testing is being conducted. An adult accompanying a patient to their appointment will only be allowed in the ultrasound room at the discretion of the ultrasound technician under special circumstances. We apologize for any inconvenience.   Follow-Up: At [facility], you and your health needs are our priority.  As part of our continuing mission to provide you with exceptional heart care, we have created designated Provider Care Teams.  These Care Teams include your  primary Cardiologist (physician) and Advanced Practice Providers (APPs -  Physician Assistants and Nurse Practitioners) who all work together to provide you with the care you need, when you need it.  We recommend signing up for the patient portal called "MyChart".  Sign up information is provided on this After Visit Summary.  MyChart is used to connect with patients for Virtual Visits (Telemedicine).  Patients are able to view lab/test results, encounter notes, upcoming appointments, etc.  Non-urgent messages can be sent to your provider as well.   To learn more about what you can do with MyChart, go to [email or web address].    Your next appointment:   1 year(s)  The format for your next appointment:   In Person  Provider:   Tessa Lerner, DO {

## 2024-02-17 ENCOUNTER — Telehealth: Payer: Self-pay | Admitting: Gastroenterology

## 2024-02-17 LAB — GI PROFILE, STOOL, PCR

## 2024-02-17 NOTE — Progress Notes (Signed)
 Called patient and left message to call back so labs can be discussed.

## 2024-02-17 NOTE — Progress Notes (Signed)
 Waiting for clearance to come back then schedule double

## 2024-02-17 NOTE — Telephone Encounter (Signed)
 Patient states she she received a call in regards to results. Please advise.   Thank you

## 2024-02-18 LAB — CLOSTRIDIUM DIFFICILE BY PCR: Toxigenic C. Difficile by PCR: NEGATIVE

## 2024-02-18 LAB — CALPROTECTIN, FECAL: Calprotectin, Fecal: <5 ug/g (ref 0–120)

## 2024-02-21 DIAGNOSIS — G309 Alzheimer's disease, unspecified: Secondary | ICD-10-CM | POA: Diagnosis not present

## 2024-02-21 DIAGNOSIS — F0280 Dementia in other diseases classified elsewhere without behavioral disturbance: Secondary | ICD-10-CM | POA: Diagnosis not present

## 2024-02-23 ENCOUNTER — Telehealth: Payer: Self-pay | Admitting: Gastroenterology

## 2024-02-23 NOTE — Telephone Encounter (Signed)
 Spoke with patient about lab work, abdominal xray, and stool tests. There seems to be some confusion when explaining her anemia and low iron  levels. I have to reinforce several times. She is taking ferrex 150 mg  2 capsules twice daily however levels still low. She is pending echo next month for cardiac clearance for colonoscopy. Overall, she states she is doing well. Will recheck her H/H in 1 mth.  [place] Antolin. NP

## 2024-02-24 ENCOUNTER — Other Ambulatory Visit: Payer: Self-pay

## 2024-02-24 DIAGNOSIS — D509 Iron deficiency anemia, unspecified: Secondary | ICD-10-CM

## 2024-03-01 ENCOUNTER — Other Ambulatory Visit: Payer: Self-pay | Admitting: Hematology

## 2024-03-01 NOTE — Telephone Encounter (Signed)
 Needs to f/up with PCP for future prescriptions

## 2024-03-07 DIAGNOSIS — R7303 Prediabetes: Secondary | ICD-10-CM | POA: Diagnosis not present

## 2024-03-07 DIAGNOSIS — E7800 Pure hypercholesterolemia, unspecified: Secondary | ICD-10-CM | POA: Diagnosis not present

## 2024-03-07 DIAGNOSIS — E039 Hypothyroidism, unspecified: Secondary | ICD-10-CM | POA: Diagnosis not present

## 2024-03-07 DIAGNOSIS — I10 Essential (primary) hypertension: Secondary | ICD-10-CM | POA: Diagnosis not present

## 2024-03-07 DIAGNOSIS — Z0000 Encounter for general adult medical examination without abnormal findings: Secondary | ICD-10-CM | POA: Diagnosis not present

## 2024-03-07 DIAGNOSIS — E559 Vitamin D deficiency, unspecified: Secondary | ICD-10-CM | POA: Diagnosis not present

## 2024-03-07 DIAGNOSIS — R739 Hyperglycemia, unspecified: Secondary | ICD-10-CM | POA: Diagnosis not present

## 2024-03-07 LAB — LAB REPORT - SCANNED: EGFR: 48

## 2024-03-08 ENCOUNTER — Telehealth: Payer: Self-pay

## 2024-03-08 NOTE — Telephone Encounter (Signed)
 Lab reminder

## 2024-03-13 ENCOUNTER — Ambulatory Visit: Payer: Self-pay | Admitting: Cardiology

## 2024-03-13 DIAGNOSIS — E7800 Pure hypercholesterolemia, unspecified: Secondary | ICD-10-CM | POA: Diagnosis not present

## 2024-03-13 DIAGNOSIS — Z0000 Encounter for general adult medical examination without abnormal findings: Secondary | ICD-10-CM | POA: Diagnosis not present

## 2024-03-13 DIAGNOSIS — E039 Hypothyroidism, unspecified: Secondary | ICD-10-CM | POA: Diagnosis not present

## 2024-03-13 DIAGNOSIS — Z7901 Long term (current) use of anticoagulants: Secondary | ICD-10-CM | POA: Diagnosis not present

## 2024-03-13 DIAGNOSIS — I4892 Unspecified atrial flutter: Secondary | ICD-10-CM | POA: Diagnosis not present

## 2024-03-13 DIAGNOSIS — I10 Essential (primary) hypertension: Secondary | ICD-10-CM | POA: Diagnosis not present

## 2024-03-13 DIAGNOSIS — E1165 Type 2 diabetes mellitus with hyperglycemia: Secondary | ICD-10-CM | POA: Diagnosis not present

## 2024-03-13 DIAGNOSIS — R399 Unspecified symptoms and signs involving the genitourinary system: Secondary | ICD-10-CM | POA: Diagnosis not present

## 2024-03-13 DIAGNOSIS — I2510 Atherosclerotic heart disease of native coronary artery without angina pectoris: Secondary | ICD-10-CM | POA: Diagnosis not present

## 2024-03-13 DIAGNOSIS — R413 Other amnesia: Secondary | ICD-10-CM | POA: Diagnosis not present

## 2024-03-13 DIAGNOSIS — R7303 Prediabetes: Secondary | ICD-10-CM | POA: Diagnosis not present

## 2024-03-13 DIAGNOSIS — E538 Deficiency of other specified B group vitamins: Secondary | ICD-10-CM | POA: Diagnosis not present

## 2024-03-16 ENCOUNTER — Other Ambulatory Visit: Payer: Self-pay

## 2024-03-16 DIAGNOSIS — I4892 Unspecified atrial flutter: Secondary | ICD-10-CM

## 2024-03-16 MED ORDER — METOPROLOL SUCCINATE ER 50 MG PO TB24: 50.0000 mg | ORAL_TABLET | Freq: Every morning | ORAL | 3 refills | Status: AC

## 2024-03-19 ENCOUNTER — Telehealth: Payer: Self-pay

## 2024-03-19 NOTE — Telephone Encounter (Signed)
 SEE FULL CARDIOLOGY NOTE (02/16/24) If and when the colonoscopy is scheduled patient is advised not to take Eliquis  2 days prior to the procedure and the day of. Restart anticoagulation once cleared by GI post procedure.

## 2024-03-22 ENCOUNTER — Ambulatory Visit (HOSPITAL_COMMUNITY)
Admission: RE | Admit: 2024-03-22 | Discharge: 2024-03-22 | Disposition: A | Discharge status: Home / Self Care | Source: Ambulatory Visit | Attending: Internal Medicine | Admitting: Internal Medicine

## 2024-03-22 DIAGNOSIS — Z01818 Encounter for other preprocedural examination: Secondary | ICD-10-CM

## 2024-03-22 DIAGNOSIS — R9431 Abnormal electrocardiogram [ECG] [EKG]: Secondary | ICD-10-CM

## 2024-03-22 LAB — ECHOCARDIOGRAM COMPLETE
Area-P 1/2: 3.6 cm2
S' Lateral: 2.3 cm

## 2024-03-23 ENCOUNTER — Ambulatory Visit: Payer: Self-pay | Admitting: Cardiology

## 2024-03-23 DIAGNOSIS — I422 Other hypertrophic cardiomyopathy: Secondary | ICD-10-CM

## 2024-03-23 DIAGNOSIS — I517 Cardiomegaly: Secondary | ICD-10-CM

## 2024-03-29 ENCOUNTER — Telehealth: Payer: Self-pay

## 2024-03-29 NOTE — Telephone Encounter (Signed)
 [facility] Pre-operative Risk Assessment     Request for surgical clearance:     Endoscopy Procedure  What type of surgery is being performed?     colonoscopy  When is this surgery scheduled?     TBD  What type of clearance is required ?   Pharmacy  Are there any medications that need to be held prior to surgery and how long? Eliquis  for 2 days  Practice name and name of physician performing surgery?      [facility]  What is your office phone and fax number?      Phone- [phone]  Fax- [phone]  Anesthesia type (None, local, MAC, general) ?       MAC   Please route your response to [employer],

## 2024-04-02 ENCOUNTER — Encounter: Payer: Self-pay | Admitting: Cardiology

## 2024-04-02 ENCOUNTER — Telehealth: Payer: Self-pay

## 2024-04-02 NOTE — Telephone Encounter (Signed)
 See Letter section.   Amy Bingman [place], DO, [facility]

## 2024-04-02 NOTE — Telephone Encounter (Signed)
-----   Message from [facility] sent at 04/02/2024  4:53 PM EDT ----- Regarding: Pre-op I have placed a pre-op letter in the the chart.   Please call the patient and inform them of their risk assessment and medication recommendations.   Also update the requesting provider's team of the letter.   Let me know if questions arise.   Sunit [place], DO, [facility]

## 2024-04-02 NOTE — Telephone Encounter (Signed)
 Spoke with pt and went over pre-op letter. Pt verbalized understanding of when to hold Eliquis  and to discuss with GI physician on when to restart after procedure. No questions at this time.

## 2024-04-13 ENCOUNTER — Telehealth: Payer: Self-pay

## 2024-04-13 NOTE — Telephone Encounter (Signed)
 Scheduling colonoscopy with Dr.Dorsey

## 2024-04-18 ENCOUNTER — Ambulatory Visit: Admitting: Gastroenterology

## 2024-05-15 ENCOUNTER — Telehealth: Payer: Self-pay

## 2024-05-15 NOTE — Telephone Encounter (Signed)
 SABRA

## 2024-05-21 ENCOUNTER — Encounter (HOSPITAL_COMMUNITY): Payer: Self-pay

## 2024-05-22 ENCOUNTER — Ambulatory Visit (HOSPITAL_COMMUNITY)

## 2024-05-28 ENCOUNTER — Ambulatory Visit: Admitting: Cardiology

## 2024-05-29 ENCOUNTER — Ambulatory Visit: Payer: Self-pay | Admitting: Cardiology

## 2024-06-29 ENCOUNTER — Other Ambulatory Visit: Payer: Self-pay | Admitting: Cardiology

## 2024-07-26 ENCOUNTER — Other Ambulatory Visit: Payer: Self-pay | Admitting: Cardiology

## 2024-08-27 ENCOUNTER — Telehealth (HOSPITAL_BASED_OUTPATIENT_CLINIC_OR_DEPARTMENT_OTHER): Payer: Self-pay

## 2024-08-27 NOTE — Telephone Encounter (Signed)
   Pre-operative Risk Assessment    Patient Name: [name]  DOB: [birth year] MRN: [id number]   Date of last office visit: 02/16/2024 Date of next office visit: NA  Request for Surgical Clearance    Procedure:  Dental Extraction - Amount of Teeth to be Pulled:  1/surgical  Date of Surgery:  Clearance TBD                                 Surgeon:  Rondel Socks Group or Practice Name:  [facility] Phone number:  [phone] Fax number:  [phone]   Type of Clearance Requested:   - Medical  - Pharmacy:  Hold Apixaban  (Eliquis ) Not indication   Type of Anesthesia:  Local    Additional requests/questions:    Bonney Augustin JONETTA Delores   08/27/2024, 9:57 AM

## 2024-08-27 NOTE — Telephone Encounter (Signed)
   Patient Name: [name]  DOB: 17-Sep-1951 MRN: [id number]  Primary Cardiologist: Madonna Large, DO  Chart reviewed as part of pre-operative protocol coverage.   Dental extractions of 1-2 teeth are considered low risk procedures per guidelines and generally do not require any specific cardiac clearance. It is also generally accepted that for extractions of 1-2 teeth and dental cleanings, there is no need to interrupt blood thinner therapy.  SBE prophylaxis is not required for the patient from a cardiac standpoint.  I will route this recommendation to the requesting party via Epic fax function and remove from pre-op pool.  Please call with questions.  Barnie Hila, NP 08/27/2024, 1:39 PM

## 2024-09-18 ENCOUNTER — Other Ambulatory Visit: Payer: Self-pay | Admitting: Cardiology

## 2024-09-18 DIAGNOSIS — E559 Vitamin D deficiency, unspecified: Secondary | ICD-10-CM | POA: Diagnosis not present

## 2024-09-18 DIAGNOSIS — E538 Deficiency of other specified B group vitamins: Secondary | ICD-10-CM | POA: Diagnosis not present

## 2024-09-18 DIAGNOSIS — I2510 Atherosclerotic heart disease of native coronary artery without angina pectoris: Secondary | ICD-10-CM

## 2024-09-18 DIAGNOSIS — E039 Hypothyroidism, unspecified: Secondary | ICD-10-CM | POA: Diagnosis not present

## 2024-09-18 DIAGNOSIS — D696 Thrombocytopenia, unspecified: Secondary | ICD-10-CM | POA: Diagnosis not present

## 2024-09-18 DIAGNOSIS — R739 Hyperglycemia, unspecified: Secondary | ICD-10-CM | POA: Diagnosis not present

## 2024-09-18 DIAGNOSIS — E7800 Pure hypercholesterolemia, unspecified: Secondary | ICD-10-CM | POA: Diagnosis not present

## 2024-09-18 LAB — LAB REPORT - SCANNED
Albumin, Urine POC: <3
Creatinine, POC: 7.9 mg/dL

## 2024-09-19 LAB — LAB REPORT - SCANNED
A1c: 5.6
EGFR: 60
TSH: 1.05 (ref 0.41–5.90)

## 2024-09-25 DIAGNOSIS — E559 Vitamin D deficiency, unspecified: Secondary | ICD-10-CM | POA: Diagnosis not present

## 2024-09-25 DIAGNOSIS — I2510 Atherosclerotic heart disease of native coronary artery without angina pectoris: Secondary | ICD-10-CM | POA: Diagnosis not present

## 2024-09-25 DIAGNOSIS — Z7901 Long term (current) use of anticoagulants: Secondary | ICD-10-CM | POA: Diagnosis not present

## 2024-09-25 DIAGNOSIS — R413 Other amnesia: Secondary | ICD-10-CM | POA: Diagnosis not present

## 2024-09-25 DIAGNOSIS — I4892 Unspecified atrial flutter: Secondary | ICD-10-CM | POA: Diagnosis not present

## 2024-09-25 DIAGNOSIS — I10 Essential (primary) hypertension: Secondary | ICD-10-CM | POA: Diagnosis not present

## 2024-09-25 DIAGNOSIS — E039 Hypothyroidism, unspecified: Secondary | ICD-10-CM | POA: Diagnosis not present

## 2024-09-25 DIAGNOSIS — E7800 Pure hypercholesterolemia, unspecified: Secondary | ICD-10-CM | POA: Diagnosis not present

## 2024-09-25 DIAGNOSIS — R7303 Prediabetes: Secondary | ICD-10-CM | POA: Diagnosis not present

## 2024-09-25 DIAGNOSIS — E538 Deficiency of other specified B group vitamins: Secondary | ICD-10-CM | POA: Diagnosis not present

## 2024-10-31 NOTE — Progress Notes (Signed)
 GWENETTE WELLONS                                          MRN: [id number]   10/31/2024   The VBCI Quality Team Specialist reviewed this patient medical record for the purposes of chart review for care gap closure. The following were reviewed: abstraction for care gap closure-glycemic status assessment.    VBCI Quality Team

## 2024-10-31 NOTE — Progress Notes (Signed)
 [name]                                          MRN: [id number]   10/31/2024   The [facility] Quality Team Specialist reviewed this patient medical record for the purposes of chart review for care gap closure. The following were reviewed: abstraction for care gap closure-controlling blood pressure.    [facility] Quality Team

## 2024-11-21 NOTE — Progress Notes (Signed)
 [name]                                          MRN: [id number]   11/21/2024   The [facility] Quality Team Specialist reviewed this patient medical record for the purposes of chart review for care gap closure. The following were reviewed: chart review for care gap closure-breast cancer screening.    [facility] Quality Team

## 2024-11-23 ENCOUNTER — Encounter: Payer: Self-pay | Admitting: *Deleted

## 2024-11-23 NOTE — Progress Notes (Signed)
 [name]                                          MRN: [id number]   11/23/2024   The [facility] Quality Team Specialist reviewed this patient medical record for the purposes of chart review for care gap closure. The following were reviewed: chart review for care gap closure-diabetic eye exam.    [facility] Quality Team
